# Patient Record
Sex: Female | Born: 1935 | Race: White | Hispanic: No | Marital: Married | Smoking: Former smoker
Health system: Southern US, Community
[De-identification: ages and names within clinical notes are randomized; demographics above are authoritative.]

## PROBLEM LIST (undated history)

## (undated) DIAGNOSIS — M8580 Other specified disorders of bone density and structure, unspecified site: Secondary | ICD-10-CM

## (undated) DIAGNOSIS — E611 Iron deficiency: Secondary | ICD-10-CM

## (undated) DIAGNOSIS — I2510 Atherosclerotic heart disease of native coronary artery without angina pectoris: Secondary | ICD-10-CM

## (undated) DIAGNOSIS — J383 Other diseases of vocal cords: Secondary | ICD-10-CM

## (undated) DIAGNOSIS — E538 Deficiency of other specified B group vitamins: Secondary | ICD-10-CM

## (undated) DIAGNOSIS — E7800 Pure hypercholesterolemia, unspecified: Secondary | ICD-10-CM

## (undated) DIAGNOSIS — K449 Diaphragmatic hernia without obstruction or gangrene: Secondary | ICD-10-CM

## (undated) DIAGNOSIS — I10 Essential (primary) hypertension: Secondary | ICD-10-CM

## (undated) DIAGNOSIS — Z8673 Personal history of transient ischemic attack (TIA), and cerebral infarction without residual deficits: Secondary | ICD-10-CM

## (undated) DIAGNOSIS — Z8679 Personal history of other diseases of the circulatory system: Secondary | ICD-10-CM

## (undated) HISTORY — DX: Deficiency of other specified B group vitamins: E53.8

## (undated) HISTORY — DX: Pure hypercholesterolemia, unspecified: E78.00

## (undated) HISTORY — DX: Atherosclerotic heart disease of native coronary artery without angina pectoris: I25.10

## (undated) HISTORY — PX: VAGINAL HYSTERECTOMY: SUR661

## (undated) HISTORY — DX: Iron deficiency: E61.1

## (undated) HISTORY — PX: OTHER SURGICAL HISTORY: SHX169

## (undated) HISTORY — DX: Diaphragmatic hernia without obstruction or gangrene: K44.9

## (undated) HISTORY — DX: Other specified disorders of bone density and structure, unspecified site: M85.80

## (undated) HISTORY — DX: Essential (primary) hypertension: I10

## (undated) HISTORY — PX: CORONARY ARTERY BYPASS GRAFT: SHX141

---

## 1997-01-04 ENCOUNTER — Encounter: Payer: Self-pay | Admitting: Cardiovascular Disease

## 2002-10-25 ENCOUNTER — Other Ambulatory Visit: Admission: RE | Admit: 2002-10-25 | Discharge: 2002-10-25 | Payer: Self-pay | Admitting: Obstetrics and Gynecology

## 2002-12-03 ENCOUNTER — Emergency Department (HOSPITAL_COMMUNITY): Admission: EM | Admit: 2002-12-03 | Discharge: 2002-12-03 | Payer: Self-pay | Admitting: Emergency Medicine

## 2003-03-15 ENCOUNTER — Encounter: Admission: RE | Admit: 2003-03-15 | Discharge: 2003-03-15 | Payer: Self-pay | Admitting: Neurosurgery

## 2003-11-26 ENCOUNTER — Other Ambulatory Visit: Admission: RE | Admit: 2003-11-26 | Discharge: 2003-11-26 | Payer: Self-pay | Admitting: Obstetrics and Gynecology

## 2004-05-05 ENCOUNTER — Encounter: Payer: Self-pay | Admitting: Cardiovascular Disease

## 2004-05-28 ENCOUNTER — Encounter: Payer: Self-pay | Admitting: Cardiovascular Disease

## 2004-06-18 ENCOUNTER — Encounter: Payer: Self-pay | Admitting: Cardiovascular Disease

## 2004-07-15 ENCOUNTER — Encounter (INDEPENDENT_AMBULATORY_CARE_PROVIDER_SITE_OTHER): Payer: Self-pay | Admitting: Specialist

## 2004-07-15 ENCOUNTER — Inpatient Hospital Stay (HOSPITAL_COMMUNITY): Admission: RE | Admit: 2004-07-15 | Discharge: 2004-07-17 | Payer: Self-pay | Admitting: Obstetrics and Gynecology

## 2007-03-20 ENCOUNTER — Ambulatory Visit (HOSPITAL_COMMUNITY): Admission: RE | Admit: 2007-03-20 | Discharge: 2007-03-20 | Payer: Self-pay | Admitting: Obstetrics and Gynecology

## 2007-03-21 ENCOUNTER — Ambulatory Visit (HOSPITAL_COMMUNITY): Admission: RE | Admit: 2007-03-21 | Discharge: 2007-03-21 | Payer: Self-pay | Admitting: Obstetrics and Gynecology

## 2007-03-28 ENCOUNTER — Emergency Department (HOSPITAL_COMMUNITY): Admission: EM | Admit: 2007-03-28 | Discharge: 2007-03-28 | Payer: Self-pay | Admitting: Emergency Medicine

## 2007-05-01 ENCOUNTER — Encounter: Admission: RE | Admit: 2007-05-01 | Discharge: 2007-05-01 | Payer: Self-pay | Admitting: Internal Medicine

## 2007-07-18 ENCOUNTER — Ambulatory Visit (HOSPITAL_COMMUNITY): Admission: RE | Admit: 2007-07-18 | Discharge: 2007-07-18 | Payer: Self-pay | Admitting: *Deleted

## 2007-12-29 ENCOUNTER — Encounter: Admission: RE | Admit: 2007-12-29 | Discharge: 2007-12-29 | Payer: Self-pay | Admitting: Internal Medicine

## 2008-01-05 ENCOUNTER — Ambulatory Visit (HOSPITAL_COMMUNITY): Admission: RE | Admit: 2008-01-05 | Discharge: 2008-01-05 | Payer: Self-pay | Admitting: *Deleted

## 2008-07-24 ENCOUNTER — Encounter: Admission: RE | Admit: 2008-07-24 | Discharge: 2008-07-24 | Payer: Self-pay | Admitting: Internal Medicine

## 2010-01-15 ENCOUNTER — Encounter: Payer: Self-pay | Admitting: Cardiovascular Disease

## 2010-02-12 ENCOUNTER — Encounter: Admission: RE | Admit: 2010-02-12 | Discharge: 2010-02-12 | Payer: Self-pay | Admitting: Internal Medicine

## 2010-08-12 ENCOUNTER — Encounter: Payer: Self-pay | Admitting: Cardiovascular Disease

## 2010-11-06 ENCOUNTER — Ambulatory Visit
Admission: RE | Admit: 2010-11-06 | Discharge: 2010-11-06 | Payer: Self-pay | Source: Home / Self Care | Attending: Cardiovascular Disease | Admitting: Cardiovascular Disease

## 2010-11-06 ENCOUNTER — Encounter: Payer: Self-pay | Admitting: Cardiovascular Disease

## 2010-11-06 DIAGNOSIS — I10 Essential (primary) hypertension: Secondary | ICD-10-CM | POA: Insufficient documentation

## 2010-11-12 NOTE — Assessment & Plan Note (Signed)
Summary: np6/dx:cad s/p cabg   Visit Type:  Initial Consult Primary Provider:  Dr Selena Batten  CC:  Cardiology consult.- CAD.  History of Present Illness: 77 year-old woman presents for initial evaluation of CAD. The patient is s/p 5 vessel CABG in 1998. She presented with bilateral arm amd mouth pain. This went on for several months and she finally underwent a stress test showing ischemia followed by cath showing multivessel disease. She then underwent CABG consisting of a LIMA-LAD, SVG - OM, and sequential SVG -acute marginal, PDA, and PLA.  The patient has no cardiovascular symptoms at this time.  She denies chest pain, dyspnea, orthopnea, PND, edema, palpitations, lightheadedness, or syncope. She is active and mows the lawn, does yardwork. She is not engaged in regular exercise. She is anxious about her first visit today and notes elevated BP, but reports her BP is usually in good range.   Current Medications (verified): 1)  Prednisone 10 Mg Tabs (Prednisone) .... Take 1 Tablet By Mouth Once A Day 2)  Vytorin 10-40 Mg Tabs (Ezetimibe-Simvastatin) .... Take One Tablet By Mouth Dailyat Bedtime 3)  Micardis 80 Mg Tabs (Telmisartan) .... Take One Tablet By Mouth Daily 4)  Bisoprolol-Hydrochlorothiazide 5-6.25 Mg Tabs (Bisoprolol-Hydrochlorothiazide) .... Take 1 Tablet By Mouth Once A Day 5)  Multivitamins  Tabs (Multiple Vitamin) .... Take 1 Tablet By Mouth Once A Day 6)  Vitamin C 500 Mg Tabs (Ascorbic Acid) .... Take 1 Tablet By Mouth Once A Day 7)  Aspirin 81 Mg Tbec (Aspirin) .... Take One Tablet By Mouth Daily 8)  Vitamin B-12 1000 Mcg Tabs (Cyanocobalamin) .... Take 1 Tablet By Mouth Once A Day 9)  Tandem 162-115.2 Mg Caps (Ferrous Fum-Iron Polysacch) .... Take 1 Capsule By Mouth Once A Day 10)  Botox Cosmetic 100 Unit Solr (Onabotulinumtoxina (Cosmetic)) .... Around Eyes Every 3 Months 11)  Botox Cosmetic 100 Unit Solr (Onabotulinumtoxina (Cosmetic)) .... Vocal Cords Every 3  Months  Allergies (verified): No Known Drug Allergies  Past History:  Past medical, surgical, family and social histories (including risk factors) reviewed, and no changes noted (except as noted below).  Past Medical History: Coronary artery disease s/p multivessel CABG 1998 Hypercholesterolemia Hypertension Osteopenia Hiatal hernia Vitamin B12 deficiency Iron deficiency  Past Surgical History:  1.  She has had coronary artery bypass grafting x 5.  2.  Left breast biopsy x 2.  3.  She has had eyelid surgery.  4. Partial hystercetomy  Family History: Reviewed history from 11/05/2010 and no changes required. Her paternal grandfather and father had strokes. Her mother had a heaart attack.  Social History: Reviewed history from 11/05/2010 and no changes required.  The patient is married and denies alcohol, tobacco, or drug  use.  Retired as Art therapist  Review of Systems       Positive for indigestion at times  Vital Signs:  Patient profile:   77 year old female Height:      59 inches Weight:      142.75 pounds BMI:     28.94 Pulse rate:   64 / minute Resp:     18 per minute BP sitting:   193 / 75  (right arm) Cuff size:   large  Vitals Entered By: Vikki Ports (November 06, 2010 11:05 AM)  Physical Exam  General:  Pt is well-developed, alert and oriented, no acute distress. overwieght, very pleasant woman. HEENT: normal Neck: no thyromegaly  JVP normal, carotid upstrokes normal without bruits Lungs: CTA Chest: equal expansion  CV: Apical impulse nondisplaced, RRR without murmur or gallop Abd: soft, obese, NT, positive BS, no HSM, no bruit Back: no CVA tenderness Ext: no clubbing, cyanosis, or edema        femoral pulses 2+ without bruits        pedal pulses 2+ and equal Skin: warm, dry, no rash Neuro: CNII-XII intact,strength 5/5 = b/l    EKG  Procedure date:  11/06/2010  Findings:      NSR 64 bpm, nonspecific  ST-T changes.  Echocardiogram  Procedure date:  05/28/2004  Findings:      Normal LV function with LVEF estimated greater than 55% Mild tricuspid regurgitation Trace MR  Impression & Recommendations:  Problem # 1:  CORONARY ATHEROSLERO AUTOL VEIN BYPASS GRAFT (ICD-414.02) The patient appears stable from a CV perspective now 14 removed from multivessel CABG. She has no symptoms at her current activity level. Operative report and echo reviewed from her paperwork (LVEF is preserved).  Would continue her current medical program as below. Will followup clinically in one year.   Her updated medication list for this problem includes:    Bisoprolol-hydrochlorothiazide 5-6.25 Mg Tabs (Bisoprolol-hydrochlorothiazide) .Marland Kitchen... Take 1 tablet by mouth once a day    Aspirin 81 Mg Tbec (Aspirin) .Marland Kitchen... Take one tablet by mouth daily  Orders: EKG w/ Interpretation (93000)  Problem # 2:  UNSPECIFIED ESSENTIAL HYPERTENSION (ICD-401.9) Elevated BP attributed to anxiety. Dr Elmyra Ricks notes reviewed and BP has been in range at his visits. She will monitor BP at home and call with results or bring readings to next visit with Dr Selena Batten.  Her updated medication list for this problem includes:    Micardis 80 Mg Tabs (Telmisartan) .Marland Kitchen... Take one tablet by mouth daily    Bisoprolol-hydrochlorothiazide 5-6.25 Mg Tabs (Bisoprolol-hydrochlorothiazide) .Marland Kitchen... Take 1 tablet by mouth once a day    Aspirin 81 Mg Tbec (Aspirin) .Marland Kitchen... Take one tablet by mouth daily  Orders: EKG w/ Interpretation (93000)  Patient Instructions: 1)  Your physician wants you to follow-up in: 1 YEAR.  You will receive a reminder letter in the mail two months in advance. If you don't receive a letter, please call our office to schedule the follow-up appointment. 2)  Your physician has requested that you regularly monitor and record your blood pressure readings at home.  Please use the same machine at the same time of day to check your readings and  record them to bring to your follow-up visit. Please call the office in 2 WEEKS with your blood pressure readings.

## 2010-11-26 ENCOUNTER — Telehealth: Payer: Self-pay | Admitting: Cardiovascular Disease

## 2010-12-08 NOTE — Letter (Signed)
Summary: [facility]  [facility]   Imported By: Marylou Mccoy 12/03/2010 16:25:18  _____________________________________________________________________  External Attachment:    Type:   Image     Comment:   External Document

## 2010-12-08 NOTE — Progress Notes (Signed)
Summary: blood pressure readings  Phone Note Call from Patient Call back at Home Phone [phone]   Caller: Patient Reason for Call: Talk to Nurse Summary of Call: pt blood pressure 126/66 135/68 130/60 last three days. pt states she was told to call in her blood pressure reading. pt has more reading for the nurse. Initial call taken by: Roe Coombs,  November 26, 2010 2:09 PM  Follow-up for Phone Call        11/07/10  131/69  12/09/10  143/68 11/10/10  164/74 11/12/10    168/45 11/12/10    136/62 11/14/10    142/70 11/16/10    136/64 11/17/10    139/63 11/18/10    157/73 11/21/10  126/66 2/13//12  135/68 11/24/10  130/60 pt says she took all around 12:30-1:00.  Aware will forward to Dr Excell Seltzer for review Dennis Bast, RN, BSN  November 26, 2010 2:38 PM  Additional Follow-up for Phone Call Additional follow up Details #1::        would continue current Rx. Additional Follow-up by: Norva Karvonen, MD,  December 01, 2010 5:56 PM    Additional Follow-up for Phone Call Additional follow up Details #2::    Pt aware of Dr Earmon Phoenix recommendation.  Julieta Gutting, RN, BSN  December 01, 2010 6:20 PM

## 2010-12-08 NOTE — Letter (Signed)
Summary: [facility]  [facility]   Imported By: Marylou Mccoy 12/03/2010 16:23:12  _____________________________________________________________________  External Attachment:    Type:   Image     Comment:   External Document

## 2010-12-08 NOTE — Op Note (Signed)
Summary: [facility]  [facility]   Imported By: Marylou Mccoy 12/03/2010 16:30:57  _____________________________________________________________________  External Attachment:    Type:   Image     Comment:   External Document

## 2010-12-17 NOTE — Letter (Signed)
Summary: [facility]  [facility]   Imported By: Marylou Mccoy 12/07/2010 08:33:11  _____________________________________________________________________  External Attachment:    Type:   Image     Comment:   External Document

## 2010-12-17 NOTE — Letter (Signed)
Summary: [facility]  [facility]   Imported By: Marylou Mccoy 12/07/2010 08:34:43  _____________________________________________________________________  External Attachment:    Type:   Image     Comment:   External Document

## 2011-02-12 ENCOUNTER — Encounter: Payer: Self-pay | Admitting: Cardiovascular Disease

## 2011-02-23 NOTE — Op Note (Signed)
[name] [name], WEYANDT              ACCOUNT NO.:  [id number]   MEDICAL RECORD NO.:  [id number]          PATIENT TYPE:  AMB   LOCATION:  ENDO                         FACILITY:  [facility]   PHYSICIAN:  Georgiana Spinner, M.D.    DATE OF BIRTH:  11/30/1931   DATE OF PROCEDURE:  07/18/2007  DATE OF DISCHARGE:                               OPERATIVE REPORT   PROCEDURE:  Upper endoscopy.   INDICATIONS:  Hemoccult positivity.   ANESTHESIA:  Fentanyl 50 mcg, Versed 4 mg.   PROCEDURE:  With the patient mildly sedated in the left lateral  decubitus position, the Pentax videoscopic endoscope was inserted in the  mouth and passed under direct vision through the esophagus which  appeared normal.  There was no evidence of Barrett's esophagus.  We  entered into the stomach through a hiatal hernia.  Fundus, body, antrum,  duodenal bulb, and second portion duodenum all appeared normal.  From  this point the endoscope was slowly withdrawn taking circumferential  views of duodenal mucosa until the endoscope been pulled back in the  stomach placed in retroflexion to view the stomach from below.  The  endoscope was straightened and withdrawn taking circumferential views  remaining gastric and esophageal mucosa.  The patient's vital signs,  pulse oximeter remained stable.  The patient tolerated procedure well  without apparent complications.   FINDINGS:  Hiatal hernia with a loose wrap of the gastroesophageal  junction around the endoscope indicating laxity of the lower esophageal  sphincter.   PLAN:  Proceed to colonoscopy.           ______________________________  Georgiana Spinner, M.D.     GMO/MEDQ  D:  07/18/2007  T:  07/18/2007  Job:  [id number]

## 2011-02-23 NOTE — Op Note (Signed)
[name] [name], [name] [name]              ACCOUNT NO.:  [id number]   MEDICAL RECORD NO.:  [id number]          PATIENT TYPE:  AMB   LOCATION:  ENDO                         FACILITY:  [facility]   PHYSICIAN:  Georgiana Spinner, M.D.    DATE OF BIRTH:  [birth year]   DATE OF PROCEDURE:  07/18/2007  DATE OF DISCHARGE:                               OPERATIVE REPORT   ANESTHESIA:  Fentanyl 50 mcg, Versed 4 mg.   INDICATIONS:  Hemoccult positivity.   PROCEDURE:  With the patient mildly sedated in the left lateral  decubitus position, the Pentax videoscopic colonoscope was inserted into  the rectum and passed under direct vision to the cecum identified by  ileocecal valve and appendiceal orifice both of which were photographed.  From this point the colonoscope was slowly withdrawn taking  circumferential views of the colonic mucosa, stopping only in the rectum  which appeared normal on direct, and I could not retroflex, the rectum  was too small, so I just pulled through the anal canal and photographed  this area.   The endoscope was withdrawn.  The patient's vital signs, and pulse  oximeter remained stable.  The patient tolerated procedure well without  apparent complications.   FINDINGS:  Internal hemorrhoids otherwise an unremarkable exam.   PLAN:  To have patient follow up with me as an outpatient.           ______________________________  Georgiana Spinner, M.D.     GMO/MEDQ  D:  07/18/2007  T:  07/18/2007  Job:  [id number]

## 2011-02-26 NOTE — H&P (Signed)
[name] [name], [name] [name]              ACCOUNT NO.:  [id number]   MEDICAL RECORD NO.:  [id number]          PATIENT TYPE:  INP   LOCATION:  NA                           FACILITY:  [facility]   PHYSICIAN:  Zenaida Niece, M.D.DATE OF BIRTH:  [birth year]   DATE OF ADMISSION:  07/15/2004  DATE OF DISCHARGE:                                HISTORY & PHYSICAL   CHIEF COMPLAINT:  Pelvic relaxation.   HISTORY AND PHYSICAL:  This is a 77 year old white female, para 1-0-0-1,  whom I have followed for several years with pelvic relaxation.  Until  recently, she has been treated successfully the past few years with a  pessary.  However, when I saw her for her annual exam in February, she was  complaining that despite the pessary, the bladder was occasionally falling  out.  We decided to continue with the pessary.  I saw her in May of this  year, and she was complaining of increasing pelvic pressure and the bladder  falling out despite the pessary, and she was even having to reduce her  bladder to urinate.  A larger pessary was placed at that time.  However, she  had discomfort with this and wishes to proceed with surgery.   PAST OBSTETRICAL HISTORY:  Vaginal delivery at term without complications.   PAST MEDICAL HISTORY:  Significant for coronary artery disease,  hypercholesterolemia, hypertension, and osteopenia.   PAST SURGICAL HISTORY:  1.  She has had coronary artery bypass grafting x 5.  2.  Left breast biopsy x 2.  3.  She has had eyelid surgery.   ALLERGIES:  None known.   CURRENT MEDICATIONS:  Detrol, folic acid, Vytorin, Teveten, aspirin, and  Fosamax.   REVIEW OF SYSTEMS:  The patient does have overactive bladder which is well  controlled with Detrol, and she has normal bowel function.   SOCIAL HISTORY:  The patient is married and denies alcohol, tobacco, or drug  use.   FAMILY HISTORY:  No GYN or colon cancer.   PHYSICAL EXAMINATION:  VITAL SIGNS:  Weight is approximately 145  pounds,  last blood pressure in the office was 130/100.  Pulse was 80.  GENERALLY:  This is a well-developed, well-nourished white female in no  acute distress.  NECK:  Supple without lymphadenopathy or thyromegaly.  LUNGS:  Clear to auscultation.  HEART:  Regular rate and rhythm without murmur, and she does have a well-  healed midline sternotomy scar.  ABDOMEN:  Soft, nontender, nondistended, without palpable masses.  EXTREMITIES:  No edema and are nontender.  PELVIC:  External genitalia reveals no lesions.  Cervix is normal, although  it does come to the introitus.  She has a grade 3 cystocele with small  rectocele and grade 2 uterine descensus.  On bimanual exam, she has a small  mid plane nontender uterus which is very mobile.  Bimanual exam confirms  this without the present of adnexal masses.   ASSESSMENT:  Symptomatic pelvic relaxation which is now failing therapy with  a pessary.  The patient wishes to undergo surgical therapy.  Risks of  surgery including bleeding,  infection, and damage to surrounding organs have  been discussed with the patient.  She does have significant medical  problems, and I do have a note from her primary care physician, Dr. Dimas Alexandria that says that she is medically stable for her surgery.   PLAN:  Admit to the patient for a vaginal hysterectomy with anterior and  posterior repair and cystoscopy.     Todd   TDM/MEDQ  D:  07/14/2004  T:  07/14/2004  Job:  [id number]

## 2011-02-26 NOTE — Op Note (Signed)
NAMEREAH, JUSTO              ACCOUNT NO.:  [id number]   MEDICAL RECORD NO.:  [id number]          PATIENT TYPE:  INP   LOCATION:  0008                         [facility]ACILITY:  [facility]   PHYSICIAN:  Zenaida Niece, M.D.DATE OF BIRTH:  03/22/1932   DATE OF PROCEDURE:  07/15/2004  DATE OF DISCHARGE:                                 OPERATIVE REPORT   PREOPERATIVE DIAGNOSIS:  Pelvic relaxation.   POSTOPERATIVE DIAGNOSIS:  Pelvic relaxation.   PROCEDURE:  Transvaginal hysterectomy.  Anterior and posterior colporrhaphy and cystoscopy.   SURGEON:  Lavina Hamman, M.D.   ASSISTANT:  Tracey Harries, M.D.   ANESTHESIA:  Spinal.   ESTIMATED BLOOD LOSS:  100 mL.   FINDINGS:  Small uterus, grade 2-3 cystocele, and grade 1 rectocele, normal  tubes and ovaries.  Normal bladder and ureters by cystoscopy.   PROCEDURE IN DETAIL:  The patient was taken to the operating room and placed  in the dorsal supine position.  She was then placed sitting, and Dr.  Shireen Quan instilled spinal anesthesia.  She was then placed in the dorsal  supine position and legs placed in mobile stirrups.  Legs were then elevated  in the stirrups, and perineum and vagina were prepped and draped in the  usual sterile fashion and bladder drained with a red rubber catheter.  Level  of her anesthesia was found to be adequate.  A weighted speculum was  inserted into the vagina, and the cervix was grasped with Jacob's  tenaculums.  Cervicovaginal mucosa was then infiltrated with a dilute  solution of Pitressin.  Cervicovaginal mucosa was then incised  circumferentially with electrocautery.  Scissors were then used for further  sharp dissection to push the vaginal mucosa off of the cervix.  Posterior  cul-de-sac was grasped and entered sharply and a Bonnano speculum placed  into the posterior cul-de-sac.  Bladder was pushed superior anteriorly.  Uterosacral ligaments were clamped, transected, and ligated with a #1  chromic and  tagged for later use.  Anterior peritoneum was identified and  entered sharply.  A Deaver was used to retract the bladder anteriorly.  Uterine arteries, cardinal ligaments, broad ligaments, and uteroovarian  ligaments were clamped, transected, and ligated with #1 chromic, and the  uteroovarian pedicles were tagged for inspection.  All sites were found to  be hemostatic.  The uterus was very small.  Tubes and ovaries were inspected  and found to be normal.  Uterosacral ligaments were then plicated in the  midline with 2-0 silk.  The previously tagged uterosacral pedicles were also  tied in the midline.   Attention was then turned to the anterior colporrhaphy.  Anterior vagina was  grasped at the vaginal apex with Allis clamps.  It was then dissected in the  midline from the vaginal apex to within 1 cm of the urethral meatus.  The  cystocele was then dissected laterally, sharply, and bluntly to mobilize the  cystocele.  Bleeding controlled with electrocautery.  Cystocele was then  reduced with interrupted sutures of 2-0 Vicryl.  Excess vaginal mucosa was  then removed.  The vagina was then closed in  a running locking fashion with  two sutures of 2-0 Vicryl.  This closed the entire vagina.  This achieved  good closure and good hemostasis and fair reduction of the cystocele.   Attention was turned to posterior colporrhaphy.  Hymenal ring was grasped at  a distance posteriorly that would still allow two fingers to easily enter  the vagina.  A piece of this ring of the mucosa was then removed sharply.  The vagina was dissected in the midline from the hymenal ring to the vaginal  apex sharply.  The small rectocele was then mobilized laterally, sharply,  and bluntly.  Rectocele was then reduced with interrupted sutures of 2-0  Vicryl.  Excess vaginal mucosa was then removed and vagina closed in a  running locking fashion with 2-0 Vicryl.  A finger was placed in the rectum  and confirmed no  sutures in the rectum and no rectal injury.   Attention was turned to cystoscopy.  The patient was given indigo carmine  IV.  The 70-degree cystoscope was inserted and the bladder instilled with  approximately 100 mL of fluid.  The bladder appeared normal with no sutures  and no injury to the bladder.  Indigo carmine was seen to come from each  ureteral orifice.  The cystoscope was removed, and a Foley catheter was  placed.  The vagina was then packed with two-inch Iodoform gauze.  The  patient was then taken down from stirrups after all instruments were removed  from the vagina.  The patient tolerated the procedure well and was taken to  the recovery room in stable condition.  Counts were correct x 2.  She  received Ancef 1 g prior to the procedure.  She had PAS hose on throughout  the procedure.     Todd   TDM/MEDQ  D:  07/15/2004  T:  07/15/2004  Job:  [id number]

## 2011-02-26 NOTE — Discharge Summary (Signed)
[name] [name], [name]              ACCOUNT NO.:  [id number]   MEDICAL RECORD NO.:  [id number]          PATIENT TYPE:  INP   LOCATION:  0443                         [facility]ACILITY:  [facility]   PHYSICIAN:  Zenaida Niece, M.D.DATE OF BIRTH:  [birth year]   DATE OF ADMISSION:  07/15/2004  DATE OF DISCHARGE:  07/17/2004                                 DISCHARGE SUMMARY   ADMISSION DIAGNOSIS:  Pelvic relaxation.   DISCHARGE DIAGNOSIS:  Pelvic relaxation.   PROCEDURES:  Vaginal hysterectomy with anterior and posterior colporrhaphy  and cystoscopy.   HISTORY AND PHYSICAL:  Please see chart for full history and physical, but  briefly this is a 77 year old white female, para 1-0-0-1, whom I have  followed for several years for pelvic relaxation.  She has been treated with  pessary but has recently been intolerant of her pessary and is admitted for  surgical therapy.   PAST MEDICAL HISTORY:  1.  Coronary artery disease.  2.  Hypercholesterolemia.  3.  Hypertension.  4.  Osteopenia.   PAST SURGICAL HISTORY:  1.  Coronary artery bypass grafting x 5.  2.  Left breast biopsy and eyelid surgery.   PHYSICAL EXAMINATION:  Significant for a benign abdomen.  On pelvic exam,  cervix was normal, although it does come to the introitus.  She has a grade  3 cystocele and a small rectocele with grade 2 uterine descensus.  She has a  small, mid plane, nontender uterus, and no adnexal masses.   HOSPITAL COURSE:  The patient was admitted on the day of surgery and  underwent the above-mentioned procedure with spinal anesthesia.  Estimated  blood loss was 100 mL.  She was found to have a very small uterus with a  grade 2-3 cystocele and a grade 1 rectocele.  Postoperatively, she had no  significant complications except some nausea.  Preoperative hemoglobin 12.3,  postoperative 10.8.  She did have slightly decreased urine output which  responded to fluid bolus.  On the morning of postoperative day #2, she was  felt to be stable enough for discharge home.   DISCHARGE INSTRUCTIONS:  1.  Regular diet.  2.  Pelvic rest.  3.  No strenuous activity.  4.  Follow up is in approximately 2 weeks.   MEDICATIONS:  1.  Percocet #40, 1-2 p.o. q.4-6h. p.r.n. pain.  2.  She is to continue all of her preadmission medications.     Todd   TDM/MEDQ  D:  08/19/2004  T:  08/19/2004  Job:  [id number]

## 2011-07-28 LAB — URINALYSIS, ROUTINE W REFLEX MICROSCOPIC: Protein, ur: NEGATIVE

## 2011-07-28 LAB — I-STAT 8, (EC8 V) (CONVERTED LAB)
Bicarbonate: 27.8 — ABNORMAL HIGH
Chloride: 107
Glucose, Bld: 99
Operator id: 272551
TCO2: 29

## 2011-07-28 LAB — POCT I-STAT CREATININE: Creatinine, Ser: 0.8

## 2011-07-28 LAB — CK: Total CK: 49

## 2011-10-18 DIAGNOSIS — G245 Blepharospasm: Secondary | ICD-10-CM | POA: Diagnosis not present

## 2011-11-05 ENCOUNTER — Encounter: Payer: Self-pay | Admitting: *Deleted

## 2011-11-09 ENCOUNTER — Ambulatory Visit (INDEPENDENT_AMBULATORY_CARE_PROVIDER_SITE_OTHER): Payer: Medicare Other | Admitting: Cardiovascular Disease

## 2011-11-09 ENCOUNTER — Encounter: Payer: Self-pay | Admitting: Cardiovascular Disease

## 2011-11-09 DIAGNOSIS — I10 Essential (primary) hypertension: Secondary | ICD-10-CM

## 2011-11-09 DIAGNOSIS — I25810 Atherosclerosis of coronary artery bypass graft(s) without angina pectoris: Secondary | ICD-10-CM | POA: Diagnosis not present

## 2011-11-09 NOTE — Patient Instructions (Signed)
Your physician wants you to follow-up in: 1 YEAR.  You will receive a reminder letter in the mail two months in advance. If you don't receive a letter, please call our office to schedule the follow-up appointment.  Your physician recommends that you continue on your current medications as directed. Please refer to the Current Medication list given to you today.  

## 2011-11-16 DIAGNOSIS — I10 Essential (primary) hypertension: Secondary | ICD-10-CM | POA: Diagnosis not present

## 2011-11-16 DIAGNOSIS — I2510 Atherosclerotic heart disease of native coronary artery without angina pectoris: Secondary | ICD-10-CM | POA: Diagnosis not present

## 2011-11-16 DIAGNOSIS — M353 Polymyalgia rheumatica: Secondary | ICD-10-CM | POA: Diagnosis not present

## 2011-11-21 ENCOUNTER — Encounter: Payer: Self-pay | Admitting: Cardiovascular Disease

## 2011-11-21 NOTE — Progress Notes (Signed)
HPI:  This is a 78 year old woman presented for followup evaluation. The patient is 15 years out from five-vessel coronary bypass surgery. She is doing well and has no complaints at this time. She specifically denies chest pain, dyspnea, edema, palpitations, lightheadedness, or syncope. She reports good compliance with her medical program. She follows regularly with Dr. Selena Batten.  Outpatient Encounter Prescriptions as of 11/09/2011  Medication Sig Dispense Refill  . aspirin 81 MG tablet Take 81 mg by mouth daily.      . bisoprolol-hydrochlorothiazide (ZIAC) 5-6.25 MG per tablet Take 1 tablet by mouth daily.      Marland Kitchen ezetimibe-simvastatin (VYTORIN) 10-40 MG per tablet Take 1 tablet by mouth at bedtime.      . Multiple Vitamin (MULTIVITAMIN) tablet Take 1 tablet by mouth daily.      Marland Kitchen omeprazole (PRILOSEC) 20 MG capsule Take 20 mg by mouth as needed.       . predniSONE (DELTASONE) 5 MG tablet Take 5 mg by mouth daily.      Marland Kitchen telmisartan (MICARDIS) 80 MG tablet Take 80 mg by mouth daily.      . vitamin C (ASCORBIC ACID) 500 MG tablet Take 500 mg by mouth daily.      Marland Kitchen DISCONTD: predniSONE (DELTASONE) 10 MG tablet Take 5 mg by mouth daily.       Marland Kitchen DISCONTD: ferrous fumarate-iron polysaccharide complex (TANDEM) 162-115.2 MG CAPS Take 1 capsule by mouth daily with breakfast.      . DISCONTD: vitamin B-12 (CYANOCOBALAMIN) 1000 MCG tablet Take 1,000 mcg by mouth daily.        No Known Allergies  Past Medical History  Diagnosis Date  . CAD (coronary artery disease)     multivessel  . Hypercholesterolemia   . Hypertension   . Osteopenia   . Hiatal hernia   . Vitamin B12 deficiency   . Iron deficiency     ROS: Negative except as per HPI  BP 110/64  Pulse 62  Ht 4' 11.5" (1.511 m)  Wt 65.318 kg (144 lb)  BMI 28.60 kg/m2  PHYSICAL EXAM: Pt is alert and oriented, NAD HEENT: normal Neck: JVP - normal, carotids 2+= without bruits Lungs: CTA bilaterally CV: RRR without murmur or gallop Abd: soft,  NT, Positive BS, no hepatomegaly Ext: no C/C/E, distal pulses intact and equal Skin: warm/dry no rash  ASSESSMENT AND PLAN:

## 2011-11-21 NOTE — Assessment & Plan Note (Signed)
The patient is stable without angina. Her medications were reviewed and she is on good therapy with aspirin, beta blocker, a statin drug, and an ARB. She will continue her current program and follow up in one year.

## 2011-11-21 NOTE — Assessment & Plan Note (Signed)
Blood pressure is well-controlled. Lipids are followed by Dr. Selena Batten. The patient will call if she develops any symptoms and otherwise will follow up in one year.

## 2012-01-24 DIAGNOSIS — G245 Blepharospasm: Secondary | ICD-10-CM | POA: Diagnosis not present

## 2012-02-04 DIAGNOSIS — J385 Laryngeal spasm: Secondary | ICD-10-CM | POA: Diagnosis not present

## 2012-02-08 DIAGNOSIS — R920 Mammographic microcalcification found on diagnostic imaging of breast: Secondary | ICD-10-CM | POA: Diagnosis not present

## 2012-02-15 ENCOUNTER — Other Ambulatory Visit: Payer: Self-pay | Admitting: Radiology

## 2012-02-15 DIAGNOSIS — Z0189 Encounter for other specified special examinations: Secondary | ICD-10-CM | POA: Diagnosis not present

## 2012-02-15 DIAGNOSIS — N6019 Diffuse cystic mastopathy of unspecified breast: Secondary | ICD-10-CM | POA: Diagnosis not present

## 2012-02-15 DIAGNOSIS — R920 Mammographic microcalcification found on diagnostic imaging of breast: Secondary | ICD-10-CM | POA: Diagnosis not present

## 2012-03-17 DIAGNOSIS — M25519 Pain in unspecified shoulder: Secondary | ICD-10-CM | POA: Diagnosis not present

## 2012-03-17 DIAGNOSIS — M719 Bursopathy, unspecified: Secondary | ICD-10-CM | POA: Diagnosis not present

## 2012-03-17 DIAGNOSIS — M353 Polymyalgia rheumatica: Secondary | ICD-10-CM | POA: Diagnosis not present

## 2012-03-17 DIAGNOSIS — M67919 Unspecified disorder of synovium and tendon, unspecified shoulder: Secondary | ICD-10-CM | POA: Diagnosis not present

## 2012-05-01 DIAGNOSIS — G245 Blepharospasm: Secondary | ICD-10-CM | POA: Diagnosis not present

## 2012-05-10 DIAGNOSIS — M353 Polymyalgia rheumatica: Secondary | ICD-10-CM | POA: Diagnosis not present

## 2012-05-10 DIAGNOSIS — I10 Essential (primary) hypertension: Secondary | ICD-10-CM | POA: Diagnosis not present

## 2012-05-15 DIAGNOSIS — M353 Polymyalgia rheumatica: Secondary | ICD-10-CM | POA: Diagnosis not present

## 2012-05-15 DIAGNOSIS — E7800 Pure hypercholesterolemia, unspecified: Secondary | ICD-10-CM | POA: Diagnosis not present

## 2012-05-15 DIAGNOSIS — I10 Essential (primary) hypertension: Secondary | ICD-10-CM | POA: Diagnosis not present

## 2012-05-15 DIAGNOSIS — K219 Gastro-esophageal reflux disease without esophagitis: Secondary | ICD-10-CM | POA: Diagnosis not present

## 2012-06-23 DIAGNOSIS — J387 Other diseases of larynx: Secondary | ICD-10-CM | POA: Diagnosis not present

## 2012-06-23 DIAGNOSIS — J385 Laryngeal spasm: Secondary | ICD-10-CM | POA: Diagnosis not present

## 2012-08-07 DIAGNOSIS — G245 Blepharospasm: Secondary | ICD-10-CM | POA: Diagnosis not present

## 2012-08-21 DIAGNOSIS — M353 Polymyalgia rheumatica: Secondary | ICD-10-CM | POA: Diagnosis not present

## 2012-08-21 DIAGNOSIS — Z23 Encounter for immunization: Secondary | ICD-10-CM | POA: Diagnosis not present

## 2012-09-27 DIAGNOSIS — N390 Urinary tract infection, site not specified: Secondary | ICD-10-CM | POA: Diagnosis not present

## 2012-09-27 DIAGNOSIS — J329 Chronic sinusitis, unspecified: Secondary | ICD-10-CM | POA: Diagnosis not present

## 2012-10-27 DIAGNOSIS — J385 Laryngeal spasm: Secondary | ICD-10-CM | POA: Diagnosis not present

## 2012-11-09 ENCOUNTER — Ambulatory Visit: Payer: Medicare Other | Admitting: Nurse Practitioner

## 2012-11-13 DIAGNOSIS — G245 Blepharospasm: Secondary | ICD-10-CM | POA: Diagnosis not present

## 2012-11-14 ENCOUNTER — Encounter: Payer: Self-pay | Admitting: Physician Assistant

## 2012-11-14 ENCOUNTER — Ambulatory Visit (INDEPENDENT_AMBULATORY_CARE_PROVIDER_SITE_OTHER): Payer: Medicare Other | Admitting: Physician Assistant

## 2012-11-14 VITALS — BP 144/70 | HR 61 | Ht 59.5 in | Wt 144.1 lb

## 2012-11-14 DIAGNOSIS — I2510 Atherosclerotic heart disease of native coronary artery without angina pectoris: Secondary | ICD-10-CM | POA: Insufficient documentation

## 2012-11-14 DIAGNOSIS — E785 Hyperlipidemia, unspecified: Secondary | ICD-10-CM | POA: Diagnosis not present

## 2012-11-14 NOTE — Progress Notes (Signed)
  [address], [address] [place], [place]  [address] Phone: [phone], Fax:  [phone]  Date:  11/14/2012   ID:  [name] [name], DOB 04/12/1932, MRN [id number]  PCP:  Pearson Grippe, MD  Primary Cardiologist:  Dr. Tonny Bollman     History of Present Illness: [name] [name] is a 78 y.o. female who returns for annual follow up. She has a history of CAD, status post CABG in 1998 (LIMA-LAD, SVG-OM, SVG-AM/PDA/PLA), HTN, HL. Last seen by Dr. Excell Seltzer 2/13.  She is doing well.  She remains very active.  she continues to do all of her yard work which includes push mowing her yard and raking.  The patient denies chest pain, shortness of breath, syncope, orthopnea, PND or significant pedal edema.   Wt Readings from Last 3 Encounters:  11/14/12 144 lb 1.9 oz (65.372 kg)  11/09/11 144 lb (65.318 kg)  11/06/10 142 lb 12 oz (64.751 kg)     Past Medical History  Diagnosis Date  . CAD (coronary artery disease)     multivessel  . Hypercholesterolemia   . Hypertension   . Osteopenia   . Hiatal hernia   . Vitamin B12 deficiency   . Iron deficiency     Current Outpatient Prescriptions  Medication Sig Dispense Refill  . aspirin 81 MG tablet Take 81 mg by mouth daily.      Marland Kitchen BENICAR 40 MG tablet       . bisoprolol-hydrochlorothiazide (ZIAC) 5-6.25 MG per tablet Take 1 tablet by mouth daily.      Marland Kitchen ezetimibe-simvastatin (VYTORIN) 10-40 MG per tablet Take 1 tablet by mouth at bedtime.      . Multiple Vitamin (MULTIVITAMIN) tablet Take 1 tablet by mouth daily.      . OnabotulinumtoxinA (BOTOX IJ) Inject as directed every 3 (three) months.      . predniSONE (DELTASONE) 5 MG tablet Take 5 mg by mouth daily.      . vitamin C (ASCORBIC ACID) 500 MG tablet Take 500 mg by mouth daily.        Allergies:   No Known Allergies  Social History:  The patient  reports that she has quit smoking. She does not have any smokeless tobacco history on file. She reports that she does not drink alcohol  or use illicit drugs.   ROS:  Please see the history of present illness.    All other systems reviewed and negative.   PHYSICAL EXAM: VS:  BP 144/70  Pulse 61  Ht 4' 11.5" (1.511 m)  Wt 144 lb 1.9 oz (65.372 kg)  BMI 28.62 kg/m2 Well nourished, well developed, in no acute distress HEENT: normal Neck: no JVD Cardiac:  normal S1, S2; RRR; no murmur Chest:  Sternal wire more prominent near manubrium  Lungs:  clear to auscultation bilaterally, no wheezing, rhonchi or rales Abd: soft, nontender, no hepatomegaly Ext: no edema Skin: warm and dry Neuro:  CNs 2-12 intact, no focal abnormalities noted  EKG:  NSR, HR 60, normal axis, nonspecific ST-T wave changes, no change from prior tracing     ASSESSMENT AND PLAN:  1. Coronary Artery Disease:  Overall stable. She denies angina. She remains quite active without limitation. Continue aspirin and statin. 2. Hypertension: Controlled.  Continue current therapy. 3. Hyperlipidemia:  Managed by PCP.  4. Disposition:  Followup with Dr. Excell Seltzer in one year or sooner as needed appear  Signed, Tereso Newcomer, PA-C  2:13 PM 11/14/2012

## 2012-11-14 NOTE — Patient Instructions (Addendum)
Your physician wants you to follow-up in: 12 MONTHS WITH DR. Excell Seltzer. You will receive a reminder letter in the mail two months in advance. If you don't receive a letter, please call our office to schedule the follow-up appointment.  NO CHANGES WERE MADE TODAY

## 2012-11-15 DIAGNOSIS — I10 Essential (primary) hypertension: Secondary | ICD-10-CM | POA: Diagnosis not present

## 2012-11-15 DIAGNOSIS — M810 Age-related osteoporosis without current pathological fracture: Secondary | ICD-10-CM | POA: Diagnosis not present

## 2012-11-30 DIAGNOSIS — E7800 Pure hypercholesterolemia, unspecified: Secondary | ICD-10-CM | POA: Diagnosis not present

## 2012-11-30 DIAGNOSIS — I10 Essential (primary) hypertension: Secondary | ICD-10-CM | POA: Diagnosis not present

## 2013-02-23 DIAGNOSIS — G245 Blepharospasm: Secondary | ICD-10-CM | POA: Diagnosis not present

## 2013-03-20 DIAGNOSIS — Z09 Encounter for follow-up examination after completed treatment for conditions other than malignant neoplasm: Secondary | ICD-10-CM | POA: Diagnosis not present

## 2013-03-20 DIAGNOSIS — N6019 Diffuse cystic mastopathy of unspecified breast: Secondary | ICD-10-CM | POA: Diagnosis not present

## 2013-04-06 DIAGNOSIS — J385 Laryngeal spasm: Secondary | ICD-10-CM | POA: Diagnosis not present

## 2013-05-30 DIAGNOSIS — E7800 Pure hypercholesterolemia, unspecified: Secondary | ICD-10-CM | POA: Diagnosis not present

## 2013-05-30 DIAGNOSIS — I10 Essential (primary) hypertension: Secondary | ICD-10-CM | POA: Diagnosis not present

## 2013-05-30 DIAGNOSIS — N390 Urinary tract infection, site not specified: Secondary | ICD-10-CM | POA: Diagnosis not present

## 2013-06-04 DIAGNOSIS — G245 Blepharospasm: Secondary | ICD-10-CM | POA: Diagnosis not present

## 2013-06-06 DIAGNOSIS — I10 Essential (primary) hypertension: Secondary | ICD-10-CM | POA: Diagnosis not present

## 2013-06-06 DIAGNOSIS — M353 Polymyalgia rheumatica: Secondary | ICD-10-CM | POA: Diagnosis not present

## 2013-06-06 DIAGNOSIS — N390 Urinary tract infection, site not specified: Secondary | ICD-10-CM | POA: Diagnosis not present

## 2013-06-06 DIAGNOSIS — R634 Abnormal weight loss: Secondary | ICD-10-CM | POA: Diagnosis not present

## 2013-06-06 DIAGNOSIS — E7800 Pure hypercholesterolemia, unspecified: Secondary | ICD-10-CM | POA: Diagnosis not present

## 2013-07-02 ENCOUNTER — Emergency Department (HOSPITAL_COMMUNITY)
Admission: EM | Admit: 2013-07-02 | Discharge: 2013-07-03 | Disposition: A | Discharge status: Home / Self Care | Payer: Medicare Other | Attending: Emergency Medicine | Admitting: Emergency Medicine

## 2013-07-02 DIAGNOSIS — Z862 Personal history of diseases of the blood and blood-forming organs and certain disorders involving the immune mechanism: Secondary | ICD-10-CM | POA: Diagnosis not present

## 2013-07-02 DIAGNOSIS — Z8719 Personal history of other diseases of the digestive system: Secondary | ICD-10-CM | POA: Diagnosis not present

## 2013-07-02 DIAGNOSIS — Y929 Unspecified place or not applicable: Secondary | ICD-10-CM | POA: Insufficient documentation

## 2013-07-02 DIAGNOSIS — IMO0002 Reserved for concepts with insufficient information to code with codable children: Secondary | ICD-10-CM | POA: Insufficient documentation

## 2013-07-02 DIAGNOSIS — S43006A Unspecified dislocation of unspecified shoulder joint, initial encounter: Secondary | ICD-10-CM | POA: Diagnosis not present

## 2013-07-02 DIAGNOSIS — I10 Essential (primary) hypertension: Secondary | ICD-10-CM | POA: Insufficient documentation

## 2013-07-02 DIAGNOSIS — Z951 Presence of aortocoronary bypass graft: Secondary | ICD-10-CM | POA: Diagnosis not present

## 2013-07-02 DIAGNOSIS — Z87891 Personal history of nicotine dependence: Secondary | ICD-10-CM | POA: Diagnosis not present

## 2013-07-02 DIAGNOSIS — Z7982 Long term (current) use of aspirin: Secondary | ICD-10-CM | POA: Insufficient documentation

## 2013-07-02 DIAGNOSIS — Z8639 Personal history of other endocrine, nutritional and metabolic disease: Secondary | ICD-10-CM | POA: Insufficient documentation

## 2013-07-02 DIAGNOSIS — I2510 Atherosclerotic heart disease of native coronary artery without angina pectoris: Secondary | ICD-10-CM | POA: Diagnosis not present

## 2013-07-02 DIAGNOSIS — M899 Disorder of bone, unspecified: Secondary | ICD-10-CM | POA: Diagnosis not present

## 2013-07-02 DIAGNOSIS — S43005A Unspecified dislocation of left shoulder joint, initial encounter: Secondary | ICD-10-CM

## 2013-07-02 DIAGNOSIS — Y9389 Activity, other specified: Secondary | ICD-10-CM | POA: Insufficient documentation

## 2013-07-02 DIAGNOSIS — X500XXA Overexertion from strenuous movement or load, initial encounter: Secondary | ICD-10-CM | POA: Insufficient documentation

## 2013-07-02 DIAGNOSIS — S43016A Anterior dislocation of unspecified humerus, initial encounter: Secondary | ICD-10-CM | POA: Diagnosis not present

## 2013-07-03 ENCOUNTER — Emergency Department (HOSPITAL_COMMUNITY): Payer: Medicare Other

## 2013-07-03 ENCOUNTER — Encounter (HOSPITAL_COMMUNITY): Payer: Self-pay | Admitting: Emergency Medicine

## 2013-07-03 DIAGNOSIS — S43016A Anterior dislocation of unspecified humerus, initial encounter: Secondary | ICD-10-CM | POA: Diagnosis not present

## 2013-07-03 MED ORDER — HYDROCODONE-ACETAMINOPHEN 5-325 MG PO TABS: 1.0000 | ORAL_TABLET | Freq: Four times a day (QID) | ORAL | Status: DC | PRN

## 2013-07-03 MED ORDER — OXYCODONE-ACETAMINOPHEN 5-325 MG PO TABS
1.0000 | ORAL_TABLET | Freq: Once | ORAL | Status: AC
  Administered 2013-07-03: 1 via ORAL
  Filled 2013-07-03: qty 1

## 2013-07-03 MED ORDER — ETOMIDATE 2 MG/ML IV SOLN: 0.1500 mg/kg | Freq: Once | INTRAVENOUS | Status: DC

## 2013-07-03 NOTE — ED Notes (Signed)
Pt states she tripped 1 wk ago and her shoulder has been 'popping out' when she lies down at night but she has been able to pop it back in. Tonight she lied down and now feels like her L shoulder is popped out but will not go back in. Shoulder does look different from R.

## 2013-07-03 NOTE — ED Provider Notes (Signed)
CSN: [id number]     Arrival date & time 07/02/13  2345 History   First MD Initiated Contact with Patient 07/03/13 0015     Chief Complaint  Patient presents with  . Shoulder Injury  . Fall   HPI Patient reports she fell off her porch one week ago and injured her left shoulder.  She's continued to have pain in her left shoulder since then.  Now she reports that over the past several nights which lies down it feels like it pops out but usually it has been popping back in.  Tonight she lied down and it felt as though it popped out but was not able to go back into place.  She's never had a shoulder dislocation before.  She denies head injury or neck pain.  No weakness in her left arm.  No chest pain or shortness of breath.  No abdominal pain.  Her pain is mild to moderate in severity at this time.  Her pain is worsened by movement and palpation of her left shoulder. Past Medical History  Diagnosis Date  . CAD (coronary artery disease)     multivessel  . Hypercholesterolemia   . Hypertension   . Osteopenia   . Hiatal hernia   . Vitamin B12 deficiency   . Iron deficiency    Past Surgical History  Procedure Laterality Date  . Coronary artery bypass graft      x 5  . Left breast biopsy    . Partial hystercetomy     Family History  Problem Relation Age of Onset  . Stroke Father   . Heart attack Mother    History  Substance Use Topics  . Smoking status: Former Games developer  . Smokeless tobacco: Not on file  . Alcohol Use: No   OB History   Grav Para Term Preterm Abortions TAB SAB Ect Mult Living                 Review of Systems  All other systems reviewed and are negative.    Allergies  Review of patient's allergies indicates no known allergies.  Home Medications   Current Outpatient Rx  Name  Route  Sig  Dispense  Refill  . aspirin 81 MG tablet   Oral   Take 81 mg by mouth daily.         Marland Kitchen BENICAR 40 MG tablet               . bisoprolol-hydrochlorothiazide (ZIAC)  5-6.25 MG per tablet   Oral   Take 1 tablet by mouth daily.         Marland Kitchen ezetimibe-simvastatin (VYTORIN) 10-40 MG per tablet   Oral   Take 1 tablet by mouth at bedtime.         Marland Kitchen HYDROcodone-acetaminophen (NORCO/VICODIN) 5-325 MG per tablet   Oral   Take 1 tablet by mouth every 6 (six) hours as needed for pain.   10 tablet   0   . Multiple Vitamin (MULTIVITAMIN) tablet   Oral   Take 1 tablet by mouth daily.         . OnabotulinumtoxinA (BOTOX IJ)   Injection   Inject as directed every 3 (three) months.         . predniSONE (DELTASONE) 5 MG tablet   Oral   Take 5 mg by mouth daily.         . vitamin C (ASCORBIC ACID) 500 MG tablet   Oral   Take 500  mg by mouth daily.          BP 159/60  Pulse 64  Temp(Src) 98.2 F (36.8 C) (Oral)  Resp 18  Ht 5' (1.524 m)  Wt 125 lb (56.7 kg)  BMI 24.41 kg/m2  SpO2 97% Physical Exam  Nursing note and vitals reviewed. Constitutional: She is oriented to person, place, and time. She appears well-developed and well-nourished.  HENT:  Head: Normocephalic.  Eyes: EOM are normal.  Neck: Normal range of motion. Neck supple.  C-spine nontender.  C-spine cleared by Nexus criteria.  Cardiovascular: Normal rate.   Pulmonary/Chest: Effort normal.  Abdominal: Soft. She exhibits no distension.  Musculoskeletal: Normal range of motion.  Squaring off of left shoulder.  Likely left shoulder anterior dislocation on examination.  Normal sensation in her deltoid region.  Normal left radial pulse.  Normal grip strength in her left hand.  Neurological: She is alert and oriented to person, place, and time.  Psychiatric: She has a normal mood and affect.    ED Course  Procedures (including critical care time)  Reduction of dislocation Date/Time: 03/11/2013 11:41 AM Performed by: Lyanne Co Authorized by: Lyanne Co Consent: Verbal consent obtained. Risks and benefits: risks, benefits and alternatives were discussed Consent  given by: patient Required items: required blood products, implants, devices, and special equipment available Time out: Immediately prior to procedure a "time out" was called to verify the correct patient, procedure, equipment, support staff and site/side marked as required. Patient sedated: NO Vitals: Vital signs were monitored during sedation. Patient tolerance: Patient tolerated the procedure well with no immediate complications. Joint: left shoulder Reduction technique: abduction and external rotation    Labs Review Labs Reviewed - No data to display Imaging Review Dg Shoulder Left  07/03/2013   CLINICAL DATA:  Left shoulder pain since a fall 07/01/2013.  EXAM: LEFT SHOULDER - 2+ VIEW  COMPARISON:  None.  FINDINGS: The humeral head is anteriorly dislocated. No fracture is identified. The acromioclavicular joint is intact. Images left lung and ribs appear normal.  IMPRESSION: Anterior dislocation left shoulder.   Electronically Signed   By: Drusilla Kanner M.D.   On: 07/03/2013 00:54   Dg Shoulder Left Port  07/03/2013   CLINICAL DATA:  Status post reduction dislocation.  EXAM: PORTABLE LEFT SHOULDER - 2+ VIEW  COMPARISON:  Plain films left shoulder is 07/03/2013 at 12:27 a.m.  FINDINGS: The humerus has been reduced. No new abnormality is identified.  IMPRESSION: Successful reduction of dislocation. No new abnormality.   Electronically Signed   By: Drusilla Kanner M.D.   On: 07/03/2013 02:08  I personally reviewed the imaging tests through PACS system I reviewed available ER/hospitalization records through the EMR   MDM   1. Dislocated shoulder, left, initial encounter    Left shoulder reduction performed without any sedation.  Patient tolerated the procedure well.  Post reduction x-ray with normal alignment and no fracture.  It sounds like this occurred after a fall one week ago.  It sounds like it's been dislocating and relocating at home.  She likely has ligamentous injury.   Outpatient orthopedic followup.  Likely will need an MRI.    Lyanne Co, MD 07/03/13 [phone]

## 2013-07-09 ENCOUNTER — Encounter (HOSPITAL_COMMUNITY): Payer: Self-pay | Admitting: *Deleted

## 2013-07-09 ENCOUNTER — Emergency Department (HOSPITAL_COMMUNITY)
Admission: EM | Admit: 2013-07-09 | Discharge: 2013-07-09 | Disposition: A | Discharge status: Home / Self Care | Payer: Medicare Other | Attending: Emergency Medicine | Admitting: Emergency Medicine

## 2013-07-09 ENCOUNTER — Emergency Department (HOSPITAL_COMMUNITY): Payer: Medicare Other

## 2013-07-09 DIAGNOSIS — Z862 Personal history of diseases of the blood and blood-forming organs and certain disorders involving the immune mechanism: Secondary | ICD-10-CM | POA: Diagnosis not present

## 2013-07-09 DIAGNOSIS — Z8719 Personal history of other diseases of the digestive system: Secondary | ICD-10-CM | POA: Insufficient documentation

## 2013-07-09 DIAGNOSIS — M25519 Pain in unspecified shoulder: Secondary | ICD-10-CM | POA: Diagnosis not present

## 2013-07-09 DIAGNOSIS — Z951 Presence of aortocoronary bypass graft: Secondary | ICD-10-CM | POA: Diagnosis not present

## 2013-07-09 DIAGNOSIS — I2510 Atherosclerotic heart disease of native coronary artery without angina pectoris: Secondary | ICD-10-CM | POA: Diagnosis not present

## 2013-07-09 DIAGNOSIS — Y9339 Activity, other involving climbing, rappelling and jumping off: Secondary | ICD-10-CM | POA: Insufficient documentation

## 2013-07-09 DIAGNOSIS — S43005A Unspecified dislocation of left shoulder joint, initial encounter: Secondary | ICD-10-CM

## 2013-07-09 DIAGNOSIS — Z7982 Long term (current) use of aspirin: Secondary | ICD-10-CM | POA: Insufficient documentation

## 2013-07-09 DIAGNOSIS — E7800 Pure hypercholesterolemia, unspecified: Secondary | ICD-10-CM | POA: Diagnosis not present

## 2013-07-09 DIAGNOSIS — Y929 Unspecified place or not applicable: Secondary | ICD-10-CM | POA: Insufficient documentation

## 2013-07-09 DIAGNOSIS — M899 Disorder of bone, unspecified: Secondary | ICD-10-CM | POA: Insufficient documentation

## 2013-07-09 DIAGNOSIS — Z4789 Encounter for other orthopedic aftercare: Secondary | ICD-10-CM | POA: Diagnosis not present

## 2013-07-09 DIAGNOSIS — Z87891 Personal history of nicotine dependence: Secondary | ICD-10-CM | POA: Insufficient documentation

## 2013-07-09 DIAGNOSIS — S43006A Unspecified dislocation of unspecified shoulder joint, initial encounter: Secondary | ICD-10-CM | POA: Insufficient documentation

## 2013-07-09 DIAGNOSIS — X500XXA Overexertion from strenuous movement or load, initial encounter: Secondary | ICD-10-CM | POA: Insufficient documentation

## 2013-07-09 DIAGNOSIS — Z79899 Other long term (current) drug therapy: Secondary | ICD-10-CM | POA: Diagnosis not present

## 2013-07-09 DIAGNOSIS — IMO0002 Reserved for concepts with insufficient information to code with codable children: Secondary | ICD-10-CM | POA: Diagnosis not present

## 2013-07-09 DIAGNOSIS — I10 Essential (primary) hypertension: Secondary | ICD-10-CM | POA: Insufficient documentation

## 2013-07-09 DIAGNOSIS — S4980XA Other specified injuries of shoulder and upper arm, unspecified arm, initial encounter: Secondary | ICD-10-CM | POA: Diagnosis not present

## 2013-07-09 MED ORDER — HYDROCODONE-ACETAMINOPHEN 5-325 MG PO TABS: 1.0000 | ORAL_TABLET | Freq: Four times a day (QID) | ORAL | Status: DC | PRN

## 2013-07-09 MED ORDER — OXYCODONE-ACETAMINOPHEN 5-325 MG PO TABS
1.0000 | ORAL_TABLET | Freq: Once | ORAL | Status: AC
  Administered 2013-07-09: 1 via ORAL
  Filled 2013-07-09: qty 1

## 2013-07-09 NOTE — ED Provider Notes (Signed)
Medical screening examination/treatment/procedure(s) were conducted as a shared visit with non-physician practitioner(s) and myself.  I personally evaluated the patient during the encounter.  Pt with recent shoulder dislocation, tonight boosted self back onto the bed and felt it dislocate.  Ivonne Andrew attempted to reduce the shoulder but was unable.  With patient's consent, I gently externally rotated her left arm, then abducted through complete upward ROM.  With this, she had good reduction of the dislocation.  Pt NVI intact after the procedure, feeling much better.  Xrays confirm reduction.  Pt instructed to wear sling until seen by ortho.  Olivia Mackie, MD 07/09/13 [phone]

## 2013-07-09 NOTE — ED Notes (Signed)
Pt states that her left arm is sore.

## 2013-07-09 NOTE — ED Notes (Signed)
Patient transported to X-ray 

## 2013-07-09 NOTE — ED Notes (Signed)
Pt was seen here last Tuesday for dislocated shoulder,  She was put in sling and told to keep in sling but she was trying to get in bed tonight and thought if she took her sling off she could get in bed better and her arm "popped" out of place again she said and states now her right arm is also hurting maybe from over compensating

## 2013-07-09 NOTE — ED Provider Notes (Addendum)
CSN: [id number]     Arrival date & time 07/09/13  0021 History   First MD Initiated Contact with Patient 07/09/13 0047     Chief Complaint  Patient presents with  . Arm Injury   HPI  History provided by the patient. Patient is a 79 year old female with history of hypertension, hypercholesterolemia, CAD who presents with complaints of left shoulder pain and concern for dislocation. Patient was recently seen in the emergency room with left shoulder pain and found to have a dislocation. This was reduced the patient has been instructed to wear a sling for healing. Patient admits that she has been taking this on and off and continuing to use her arm. Tonight she had her sling off and was climbing into bed pushing down on her left arm when she felt a pop and movement in her shoulder similar to her dislocation previously. Since then she has had pain to the shoulder worse than baseline. There was also reduced range of motion.  She denies any numbness or weakness in her fingers. No other aggravating or alleviating factors. No other associated symptoms.     Past Medical History  Diagnosis Date  . CAD (coronary artery disease)     multivessel  . Hypercholesterolemia   . Hypertension   . Osteopenia   . Hiatal hernia   . Vitamin B12 deficiency   . Iron deficiency    Past Surgical History  Procedure Laterality Date  . Coronary artery bypass graft      x 5  . Left breast biopsy    . Partial hystercetomy     Family History  Problem Relation Age of Onset  . Stroke Father   . Heart attack Mother    History  Substance Use Topics  . Smoking status: Former Games developer  . Smokeless tobacco: Not on file  . Alcohol Use: No   OB History   Grav Para Term Preterm Abortions TAB SAB Ect Mult Living                 Review of Systems  Neurological: Negative for weakness and numbness.  All other systems reviewed and are negative.    Allergies  Review of patient's allergies indicates no known  allergies.  Home Medications   Current Outpatient Rx  Name  Route  Sig  Dispense  Refill  . acetaminophen (TYLENOL) 500 MG tablet   Oral   Take 1,000 mg by mouth every 6 (six) hours as needed for pain (pain).         Marland Kitchen aspirin 81 MG tablet   Oral   Take 81 mg by mouth daily.         Marland Kitchen BENICAR 40 MG tablet   Oral   Take 20 mg by mouth daily. Been taking 1/2 tab for a month         . bisoprolol-hydrochlorothiazide (ZIAC) 5-6.25 MG per tablet   Oral   Take 1 tablet by mouth daily.         Marland Kitchen ezetimibe-simvastatin (VYTORIN) 10-40 MG per tablet   Oral   Take 1 tablet by mouth at bedtime.         Marland Kitchen HYDROcodone-acetaminophen (NORCO/VICODIN) 5-325 MG per tablet   Oral   Take 1 tablet by mouth every 6 (six) hours as needed for pain.   10 tablet   0   . Multiple Vitamin (MULTIVITAMIN) tablet   Oral   Take 1 tablet by mouth daily.         Marland Kitchen  OnabotulinumtoxinA (BOTOX IJ)   Injection   Inject as directed every 3 (three) months.         . predniSONE (DELTASONE) 5 MG tablet   Oral   Take 5 mg by mouth daily.         . vitamin C (ASCORBIC ACID) 500 MG tablet   Oral   Take 500 mg by mouth daily.          BP 188/73  Pulse 61  Temp(Src) 98.1 F (36.7 C) (Oral)  Resp 20  SpO2 99% Physical Exam  Nursing note and vitals reviewed. Constitutional: She is oriented to person, place, and time. She appears well-developed and well-nourished. No distress.  HENT:  Head: Normocephalic and atraumatic.  Eyes: Conjunctivae are normal.  Cardiovascular: Normal rate and regular rhythm.   Pulmonary/Chest: Effort normal and breath sounds normal. No respiratory distress. She has no wheezes. She has no rales.  Musculoskeletal:  Tenderness over the left shoulder. There is no appreciable deformity. There is reduced range of motion is actually with abduction secondary to pain. Normal forward flexion and extension at the shoulder. Normal lateral sensations to the deltoid. Normal  distal radial pulses, good strength and sensation in hands.  Unremarkable right upper extremity exam.  Neurological: She is alert and oriented to person, place, and time.  Skin: Skin is warm and dry. No rash noted.  Psychiatric: She has a normal mood and affect. Her behavior is normal.    ED Course  Procedures    Imaging Review Dg Shoulder Left  07/09/2013   CLINICAL DATA:  Postreduction.  EXAM: LEFT SHOULDER - 2+ VIEW  COMPARISON:  07/09/2013  FINDINGS: Interval reduction of the left shoulder. No visible fracture. Degenerative changes in the left AC joint.  IMPRESSION: Interval reduction. No visible fracture.   Electronically Signed   By: Charlett Nose M.D.   On: 07/09/2013 03:02   Dg Shoulder Left  07/09/2013   *RADIOLOGY REPORT*  Clinical Data: Arm injury  LEFT SHOULDER - 2+ VIEW  Comparison: Prior radiograph from 07/03/2013  Findings: The left humeral head is dislocated anteriorly and inferiorly relative to its expected location with the glenoid. There is no associated Hill-Sachs deformity or other fracture.  AC joint is approximated.  These findings are similar as compared to the prior radiograph from 07/03/2013.  No soft tissue abnormality.  Patchy opacity is present within the left lung base, incompletely visualized.  IMPRESSION: Anterior dislocation of the left shoulder, similar to prior study from 07/03/2013.   Original Report Authenticated By: Rise Mu, M.D.    MDM   1. Shoulder dislocation, left, initial encounter      Patient seen and evaluated. Patient appears uncomfortable but in no acute distress. There is no appreciable asymmetry of her left shoulder. She does have reduced range of motion secondary to pain. Will obtain x-rays for further imaging. Percocet ordered for pain.  Patient was seen and evaluated with attending physician. Dr. Norlene Campbell was able to relocate the left shoulder with gentle manipulation. Patient tolerated well only mild to moderate  discomfort.  Postreduction films confirm reduction. Patient will be discharged at this time instructed to followup with orthopedic specialist.     Angus Seller, PA-C 07/09/13 [phone]

## 2013-07-09 NOTE — ED Provider Notes (Signed)
Medical screening examination/treatment/procedure(s) were performed by non-physician practitioner and as supervising physician I was immediately available for consultation/collaboration.  Gaspard Isbell M Callan Yontz, MD 07/09/13 0532 

## 2013-07-09 NOTE — ED Notes (Signed)
IV held per pt request at this time,  States last reduction a week ago didn't use IV only po percocet and she would like to do this way again if possible

## 2013-07-14 DIAGNOSIS — S4980XA Other specified injuries of shoulder and upper arm, unspecified arm, initial encounter: Secondary | ICD-10-CM | POA: Diagnosis not present

## 2013-07-14 DIAGNOSIS — S43006A Unspecified dislocation of unspecified shoulder joint, initial encounter: Secondary | ICD-10-CM | POA: Diagnosis not present

## 2013-07-18 DIAGNOSIS — S43006A Unspecified dislocation of unspecified shoulder joint, initial encounter: Secondary | ICD-10-CM | POA: Diagnosis not present

## 2013-07-19 DIAGNOSIS — I10 Essential (primary) hypertension: Secondary | ICD-10-CM | POA: Diagnosis not present

## 2013-07-19 DIAGNOSIS — Z23 Encounter for immunization: Secondary | ICD-10-CM | POA: Diagnosis not present

## 2013-07-19 DIAGNOSIS — M79609 Pain in unspecified limb: Secondary | ICD-10-CM | POA: Diagnosis not present

## 2013-08-01 DIAGNOSIS — M25519 Pain in unspecified shoulder: Secondary | ICD-10-CM | POA: Diagnosis not present

## 2013-08-07 DIAGNOSIS — M25519 Pain in unspecified shoulder: Secondary | ICD-10-CM | POA: Diagnosis not present

## 2013-08-09 DIAGNOSIS — M25519 Pain in unspecified shoulder: Secondary | ICD-10-CM | POA: Diagnosis not present

## 2013-08-13 DIAGNOSIS — M25519 Pain in unspecified shoulder: Secondary | ICD-10-CM | POA: Diagnosis not present

## 2013-08-15 DIAGNOSIS — M25519 Pain in unspecified shoulder: Secondary | ICD-10-CM | POA: Diagnosis not present

## 2013-08-16 DIAGNOSIS — M25519 Pain in unspecified shoulder: Secondary | ICD-10-CM | POA: Diagnosis not present

## 2013-08-21 DIAGNOSIS — M25519 Pain in unspecified shoulder: Secondary | ICD-10-CM | POA: Diagnosis not present

## 2013-08-23 DIAGNOSIS — M25519 Pain in unspecified shoulder: Secondary | ICD-10-CM | POA: Diagnosis not present

## 2013-08-27 DIAGNOSIS — M353 Polymyalgia rheumatica: Secondary | ICD-10-CM | POA: Diagnosis not present

## 2013-08-28 DIAGNOSIS — M25519 Pain in unspecified shoulder: Secondary | ICD-10-CM | POA: Diagnosis not present

## 2013-08-30 DIAGNOSIS — M25519 Pain in unspecified shoulder: Secondary | ICD-10-CM | POA: Diagnosis not present

## 2013-09-11 DIAGNOSIS — G245 Blepharospasm: Secondary | ICD-10-CM | POA: Diagnosis not present

## 2013-09-13 DIAGNOSIS — M25519 Pain in unspecified shoulder: Secondary | ICD-10-CM | POA: Diagnosis not present

## 2013-09-14 DIAGNOSIS — J385 Laryngeal spasm: Secondary | ICD-10-CM | POA: Diagnosis not present

## 2013-12-13 DIAGNOSIS — I10 Essential (primary) hypertension: Secondary | ICD-10-CM | POA: Diagnosis not present

## 2013-12-13 DIAGNOSIS — E7800 Pure hypercholesterolemia, unspecified: Secondary | ICD-10-CM | POA: Diagnosis not present

## 2013-12-13 DIAGNOSIS — R634 Abnormal weight loss: Secondary | ICD-10-CM | POA: Diagnosis not present

## 2013-12-18 ENCOUNTER — Encounter: Payer: Self-pay | Admitting: Cardiovascular Disease

## 2013-12-18 DIAGNOSIS — G245 Blepharospasm: Secondary | ICD-10-CM | POA: Diagnosis not present

## 2013-12-18 DIAGNOSIS — R300 Dysuria: Secondary | ICD-10-CM | POA: Diagnosis not present

## 2013-12-20 DIAGNOSIS — E7800 Pure hypercholesterolemia, unspecified: Secondary | ICD-10-CM | POA: Diagnosis not present

## 2013-12-20 DIAGNOSIS — I10 Essential (primary) hypertension: Secondary | ICD-10-CM | POA: Diagnosis not present

## 2013-12-20 DIAGNOSIS — M810 Age-related osteoporosis without current pathological fracture: Secondary | ICD-10-CM | POA: Diagnosis not present

## 2013-12-25 DIAGNOSIS — M353 Polymyalgia rheumatica: Secondary | ICD-10-CM | POA: Diagnosis not present

## 2014-02-01 IMAGING — CR DG SHOULDER 2+V*L*
2 series · 2 of 2 positions shown · non-contrast
Comparison: None.

CLINICAL DATA: Left shoulder pain since a fall 07/01/2013.

EXAM:
LEFT SHOULDER - 2+ VIEW

[w shoulder internal left]
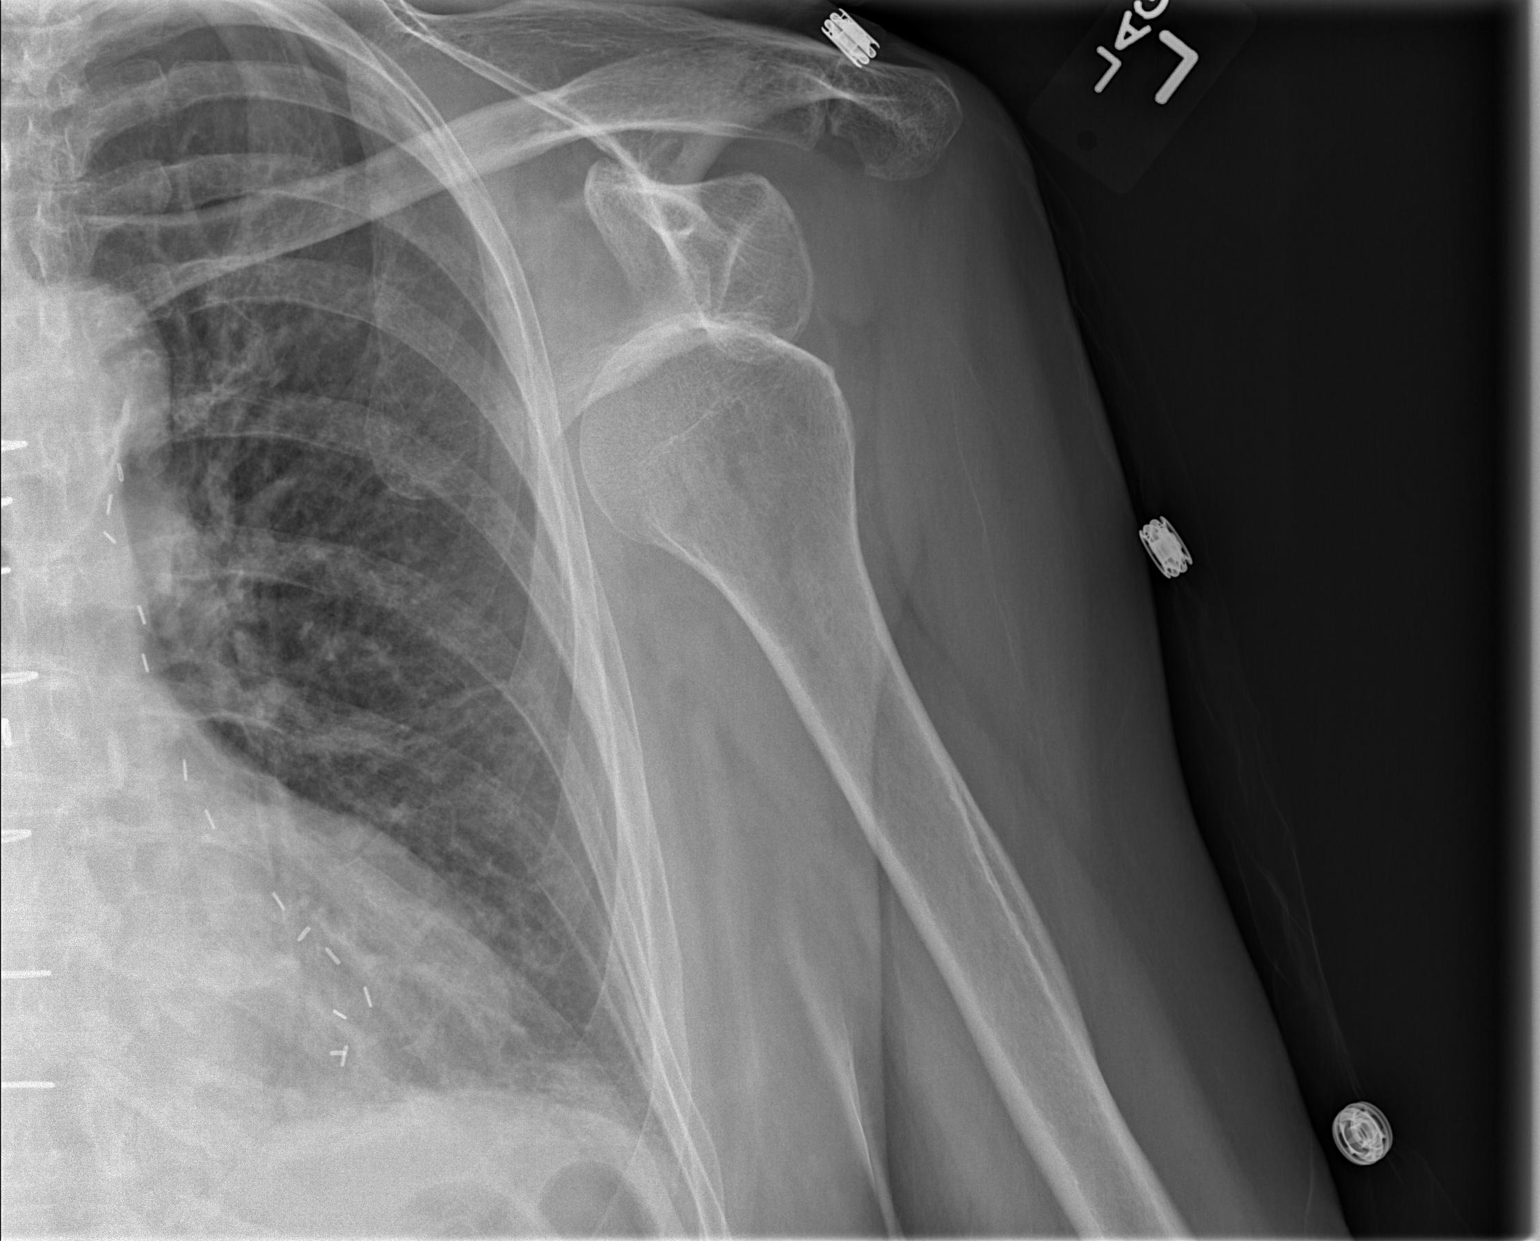

[w shoulder external left]
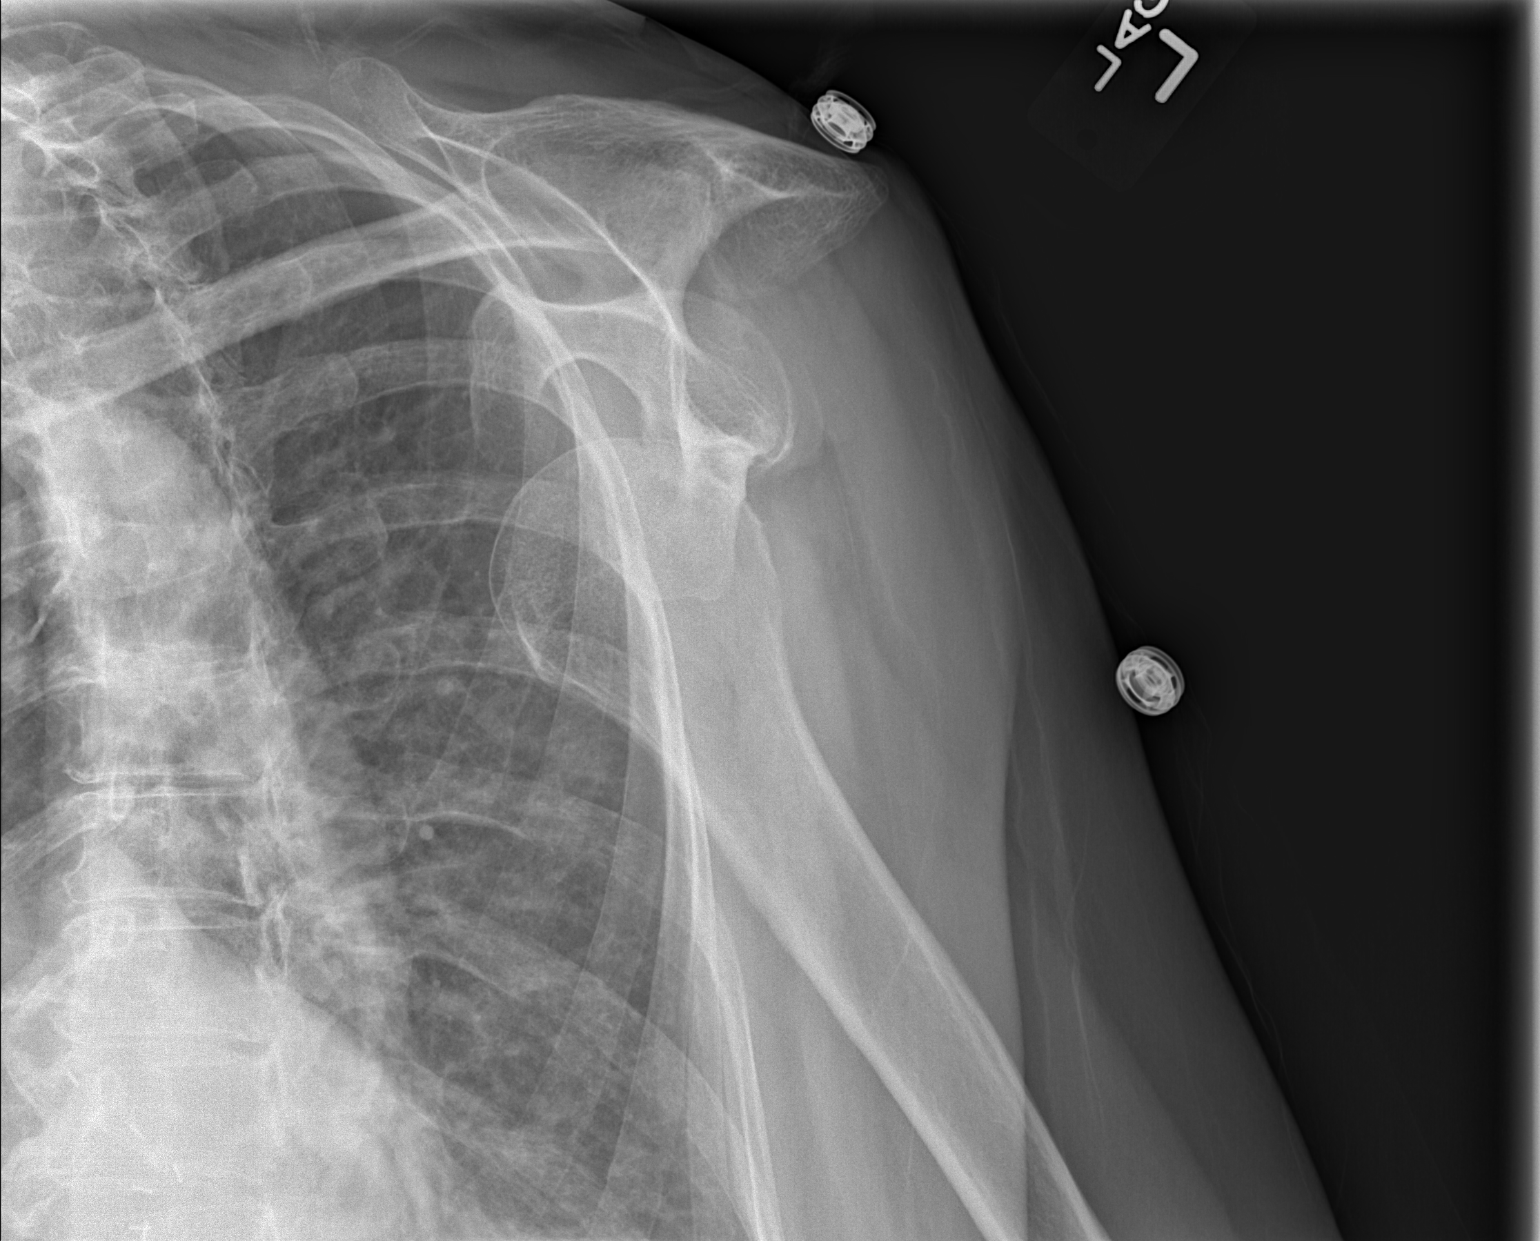

[2 of 2 positions shown; findings below may reference images not displayed]

FINDINGS: The humeral head is anteriorly dislocated. No fracture is
identified. The acromioclavicular joint is intact. Images left lung
and ribs appear normal.
IMPRESSION: Anterior dislocation left shoulder.

## 2014-02-01 IMAGING — CR DG SHOULDER 1V*L*
1 series · 2 of 2 positions shown · non-contrast
Comparison: Plain films left shoulder is 07/03/2013 at [DATE] a.m.

CLINICAL DATA: Status post reduction dislocation.

EXAM:
PORTABLE LEFT SHOULDER - 2+ VIEW

[Series 1: AP · U · 2 of 2 slices shown]
[im 1/2]
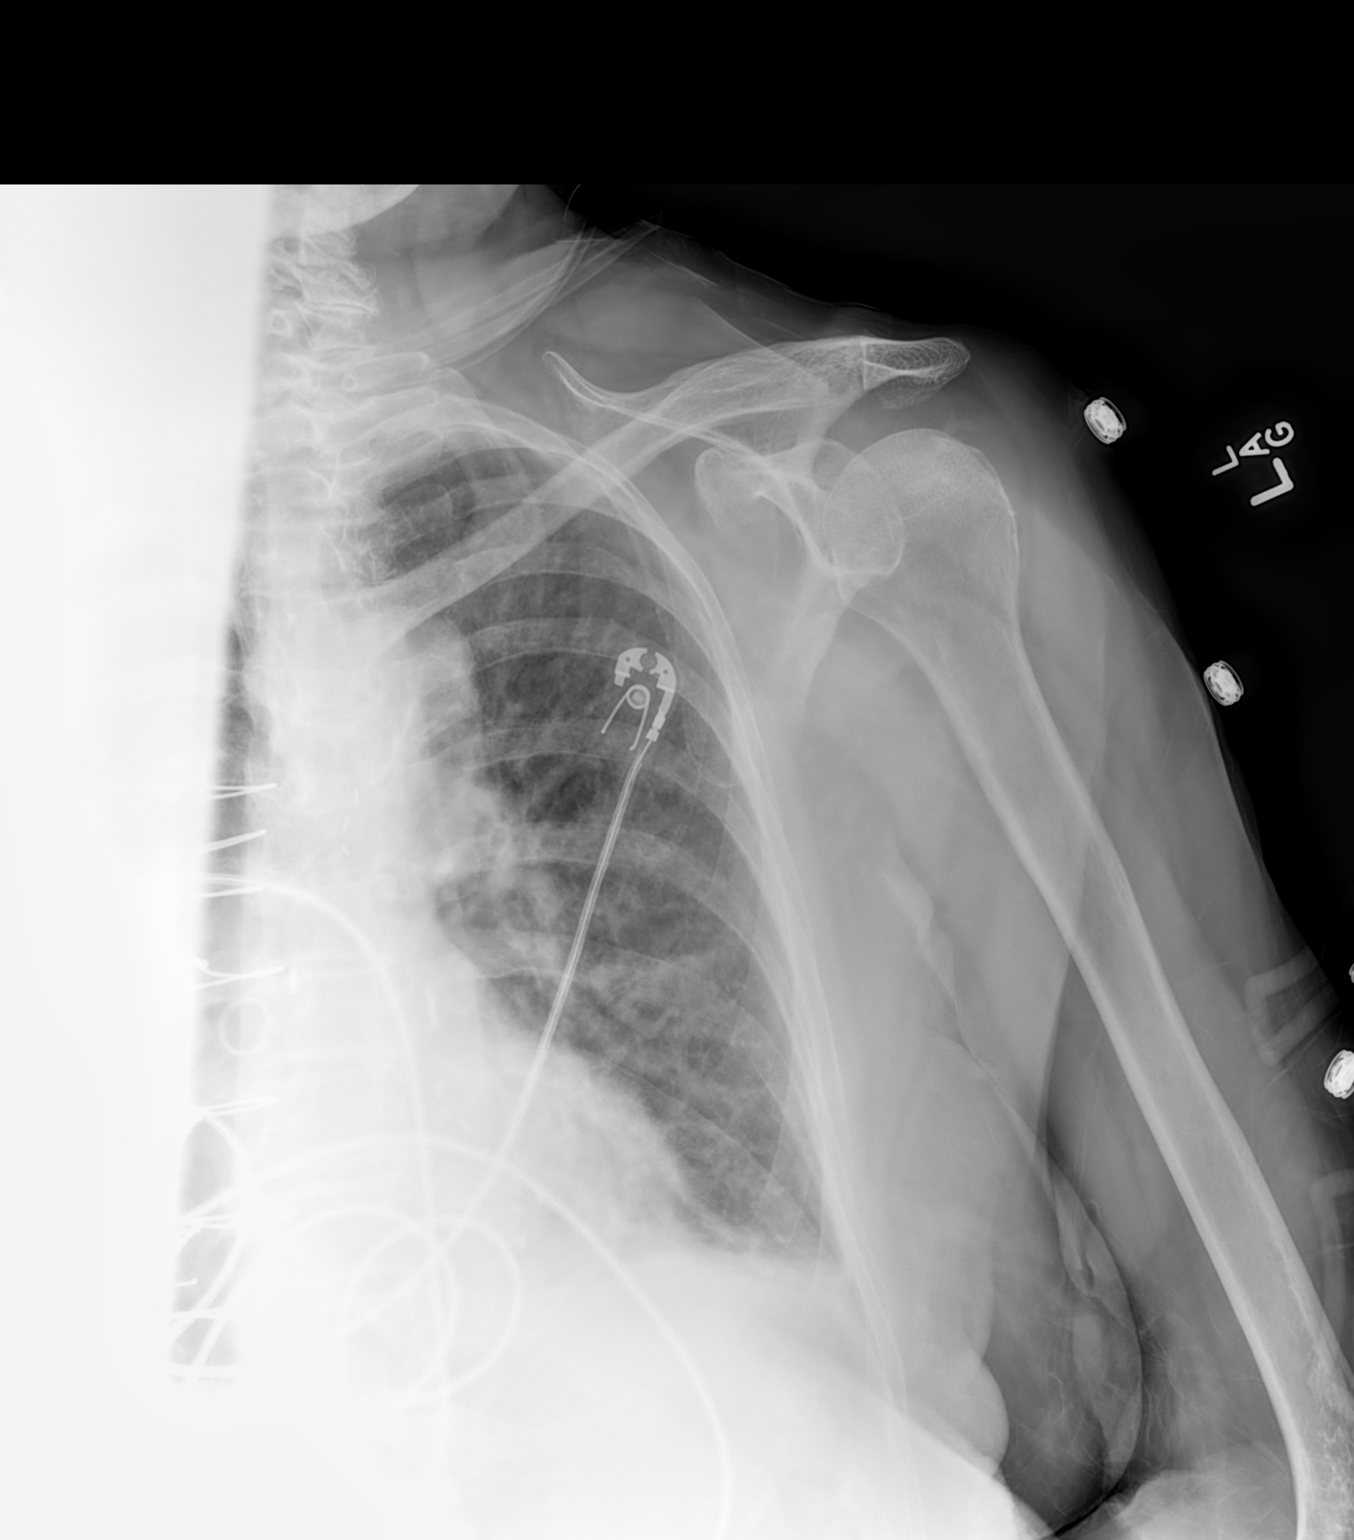
[im 2/2]
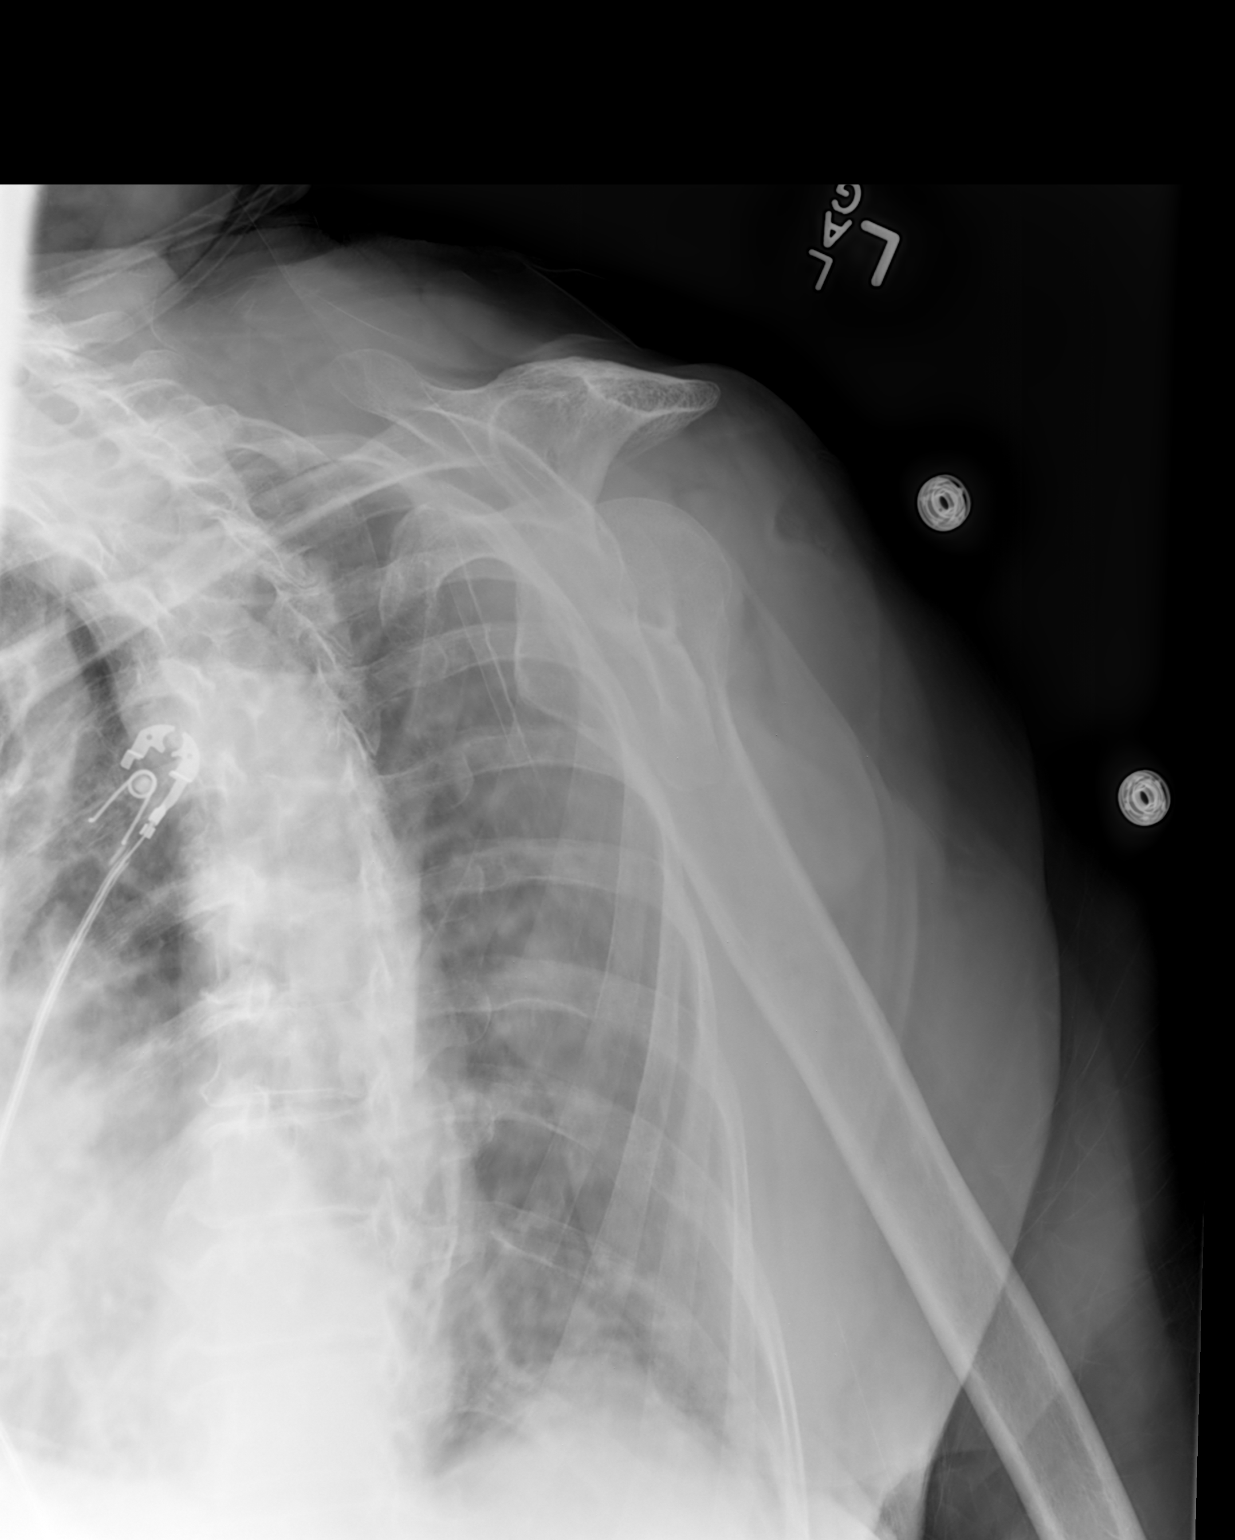

[2 of 2 positions shown; findings below may reference images not displayed]

FINDINGS: The humerus has been reduced. No new abnormality is identified.
IMPRESSION: Successful reduction of dislocation. No new abnormality.

## 2014-02-07 IMAGING — CR DG SHOULDER 2+V*L*
1 series · 1 of 1 positions shown · non-contrast
Comparison: Prior radiograph from 07/03/2013

CLINICAL DATA: Arm injury

LEFT SHOULDER - 2+ VIEW

[x shoulder ap left]
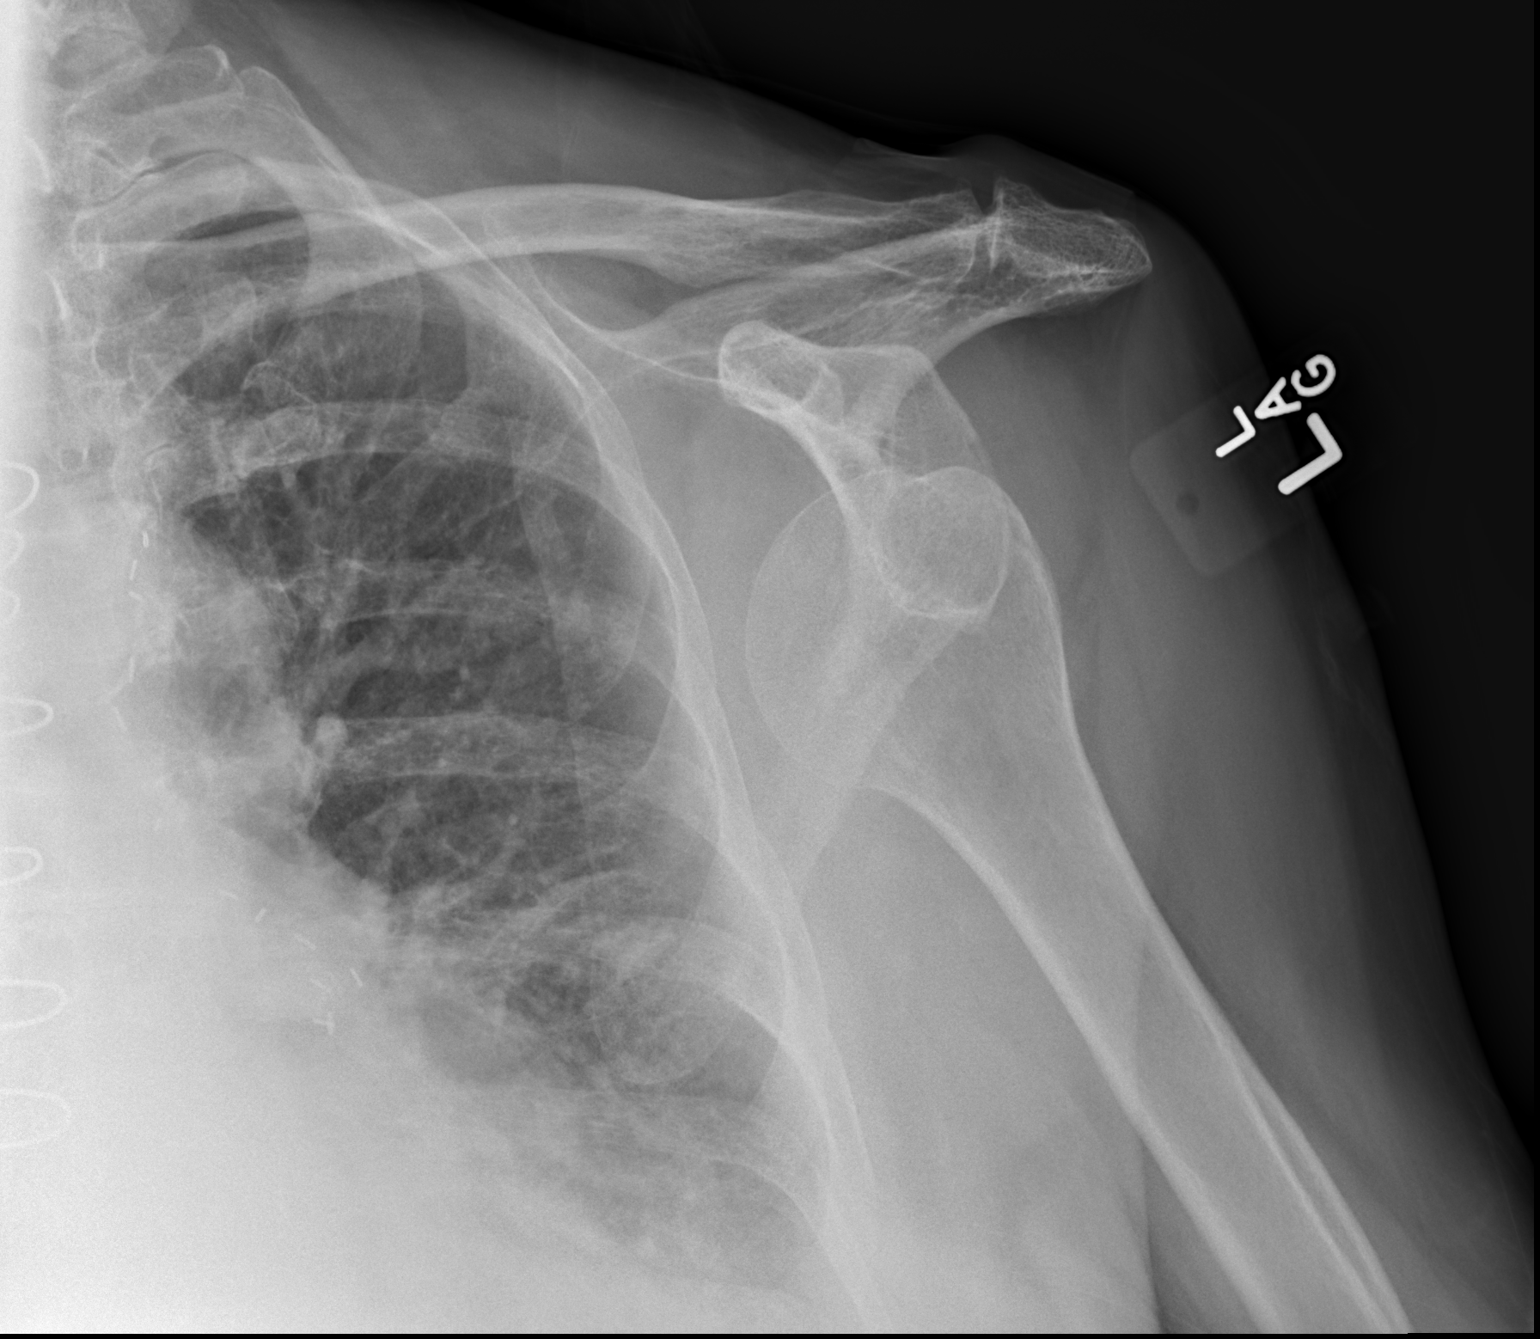

[1 of 1 positions shown; findings below may reference images not displayed]

FINDINGS: The left humeral head is dislocated anteriorly and
inferiorly relative to its expected location with the glenoid.
There is no associated Hill-Sachs deformity or other fracture.  AC
joint is approximated.  These findings are similar as compared to
the prior radiograph from 07/03/2013.

No soft tissue abnormality.  Patchy opacity is present within the
left lung base, incompletely visualized.
IMPRESSION: Anterior dislocation of the left shoulder, similar to prior study
from 07/03/2013.

## 2014-02-14 DIAGNOSIS — M353 Polymyalgia rheumatica: Secondary | ICD-10-CM | POA: Diagnosis not present

## 2014-03-08 DIAGNOSIS — J385 Laryngeal spasm: Secondary | ICD-10-CM | POA: Diagnosis not present

## 2014-03-25 DIAGNOSIS — B029 Zoster without complications: Secondary | ICD-10-CM | POA: Diagnosis not present

## 2014-03-26 DIAGNOSIS — G245 Blepharospasm: Secondary | ICD-10-CM | POA: Diagnosis not present

## 2014-04-25 DIAGNOSIS — M353 Polymyalgia rheumatica: Secondary | ICD-10-CM | POA: Diagnosis not present

## 2014-04-26 ENCOUNTER — Inpatient Hospital Stay (HOSPITAL_COMMUNITY): Payer: Medicare Other

## 2014-04-26 ENCOUNTER — Inpatient Hospital Stay (HOSPITAL_COMMUNITY)
Admission: EM | Admit: 2014-04-26 | Discharge: 2014-04-30 | DRG: 062 | Disposition: A | Discharge status: Home / Self Care | Payer: Medicare Other | Attending: Neurology | Admitting: Neurology

## 2014-04-26 ENCOUNTER — Emergency Department (HOSPITAL_COMMUNITY): Payer: Medicare Other

## 2014-04-26 ENCOUNTER — Encounter (HOSPITAL_COMMUNITY): Payer: Self-pay | Admitting: Emergency Medicine

## 2014-04-26 DIAGNOSIS — I369 Nonrheumatic tricuspid valve disorder, unspecified: Secondary | ICD-10-CM | POA: Diagnosis not present

## 2014-04-26 DIAGNOSIS — IMO0002 Reserved for concepts with insufficient information to code with codable children: Secondary | ICD-10-CM

## 2014-04-26 DIAGNOSIS — I6340 Cerebral infarction due to embolism of unspecified cerebral artery: Principal | ICD-10-CM | POA: Diagnosis present

## 2014-04-26 DIAGNOSIS — I2510 Atherosclerotic heart disease of native coronary artery without angina pectoris: Secondary | ICD-10-CM | POA: Diagnosis present

## 2014-04-26 DIAGNOSIS — Z79899 Other long term (current) drug therapy: Secondary | ICD-10-CM | POA: Diagnosis not present

## 2014-04-26 DIAGNOSIS — I6529 Occlusion and stenosis of unspecified carotid artery: Secondary | ICD-10-CM | POA: Diagnosis not present

## 2014-04-26 DIAGNOSIS — E440 Moderate protein-calorie malnutrition: Secondary | ICD-10-CM | POA: Diagnosis present

## 2014-04-26 DIAGNOSIS — G8190 Hemiplegia, unspecified affecting unspecified side: Secondary | ICD-10-CM | POA: Diagnosis present

## 2014-04-26 DIAGNOSIS — J387 Other diseases of larynx: Secondary | ICD-10-CM | POA: Diagnosis present

## 2014-04-26 DIAGNOSIS — I10 Essential (primary) hypertension: Secondary | ICD-10-CM | POA: Diagnosis present

## 2014-04-26 DIAGNOSIS — Z87891 Personal history of nicotine dependence: Secondary | ICD-10-CM | POA: Diagnosis not present

## 2014-04-26 DIAGNOSIS — R29810 Facial weakness: Secondary | ICD-10-CM | POA: Diagnosis present

## 2014-04-26 DIAGNOSIS — R471 Dysarthria and anarthria: Secondary | ICD-10-CM | POA: Diagnosis present

## 2014-04-26 DIAGNOSIS — Z7982 Long term (current) use of aspirin: Secondary | ICD-10-CM

## 2014-04-26 DIAGNOSIS — I6789 Other cerebrovascular disease: Secondary | ICD-10-CM | POA: Diagnosis not present

## 2014-04-26 DIAGNOSIS — Z951 Presence of aortocoronary bypass graft: Secondary | ICD-10-CM | POA: Diagnosis not present

## 2014-04-26 DIAGNOSIS — R4789 Other speech disturbances: Secondary | ICD-10-CM | POA: Diagnosis not present

## 2014-04-26 DIAGNOSIS — I4891 Unspecified atrial fibrillation: Secondary | ICD-10-CM | POA: Diagnosis present

## 2014-04-26 DIAGNOSIS — E785 Hyperlipidemia, unspecified: Secondary | ICD-10-CM | POA: Diagnosis present

## 2014-04-26 DIAGNOSIS — I639 Cerebral infarction, unspecified: Secondary | ICD-10-CM | POA: Diagnosis present

## 2014-04-26 DIAGNOSIS — I6350 Cerebral infarction due to unspecified occlusion or stenosis of unspecified cerebral artery: Secondary | ICD-10-CM | POA: Diagnosis not present

## 2014-04-26 LAB — DIFFERENTIAL
BASOS PCT: 0 % (ref 0–1)
Basophils Absolute: 0 10*3/uL (ref 0.0–0.1)
EOS ABS: 0.1 10*3/uL (ref 0.0–0.7)
Eosinophils Relative: 2 % (ref 0–5)
Lymphocytes Relative: 26 % (ref 12–46)
Lymphs Abs: 1.7 10*3/uL (ref 0.7–4.0)
MONOS PCT: 12 % (ref 3–12)
Monocytes Absolute: 0.8 10*3/uL (ref 0.1–1.0)
NEUTROS ABS: 4 10*3/uL (ref 1.7–7.7)
Neutrophils Relative %: 60 % (ref 43–77)

## 2014-04-26 LAB — I-STAT CHEM 8, ED
BUN: 16 mg/dL (ref 6–23)
CALCIUM ION: 1.34 mmol/L — AB (ref 1.13–1.30)
Chloride: 105 mEq/L (ref 96–112)
Creatinine, Ser: 1 mg/dL (ref 0.50–1.10)
Glucose, Bld: 110 mg/dL — ABNORMAL HIGH (ref 70–99)
HEMATOCRIT: 37 % (ref 36.0–46.0)
Hemoglobin: 12.6 g/dL (ref 12.0–15.0)
Potassium: 3.9 mEq/L (ref 3.7–5.3)
Sodium: 140 mEq/L (ref 137–147)
TCO2: 24 mmol/L (ref 0–100)

## 2014-04-26 LAB — COMPREHENSIVE METABOLIC PANEL
ALBUMIN: 3.8 g/dL (ref 3.5–5.2)
ALT: 20 U/L (ref 0–35)
AST: 31 U/L (ref 0–37)
Alkaline Phosphatase: 46 U/L (ref 39–117)
Anion gap: 15 (ref 5–15)
BILIRUBIN TOTAL: 0.3 mg/dL (ref 0.3–1.2)
BUN: 17 mg/dL (ref 6–23)
CHLORIDE: 100 meq/L (ref 96–112)
CO2: 25 mEq/L (ref 19–32)
CREATININE: 0.92 mg/dL (ref 0.50–1.10)
Calcium: 10.6 mg/dL — ABNORMAL HIGH (ref 8.4–10.5)
GFR calc Af Amer: 67 mL/min — ABNORMAL LOW (ref >90)
GFR calc non Af Amer: 58 mL/min — ABNORMAL LOW (ref >90)
Glucose, Bld: 106 mg/dL — ABNORMAL HIGH (ref 70–99)
Potassium: 4.1 mEq/L (ref 3.7–5.3)
Sodium: 140 mEq/L (ref 137–147)
Total Protein: 6.9 g/dL (ref 6.0–8.3)

## 2014-04-26 LAB — CBC
HCT: 35.1 % — ABNORMAL LOW (ref 36.0–46.0)
HEMOGLOBIN: 10.9 g/dL — AB (ref 12.0–15.0)
MCH: 25.6 pg — AB (ref 26.0–34.0)
MCHC: 31.1 g/dL (ref 30.0–36.0)
MCV: 82.4 fL (ref 78.0–100.0)
Platelets: 254 10*3/uL (ref 150–400)
RBC: 4.26 MIL/uL (ref 3.87–5.11)
RDW: 15.6 % — ABNORMAL HIGH (ref 11.5–15.5)
WBC: 6.8 10*3/uL (ref 4.0–10.5)

## 2014-04-26 LAB — ETHANOL

## 2014-04-26 LAB — PROTIME-INR
INR: 0.98 (ref 0.00–1.49)
Prothrombin Time: 13 seconds (ref 11.6–15.2)

## 2014-04-26 LAB — I-STAT TROPONIN, ED: TROPONIN I, POC: 0.02 ng/mL (ref 0.00–0.08)

## 2014-04-26 LAB — APTT: aPTT: 28 seconds (ref 24–37)

## 2014-04-26 LAB — MRSA PCR SCREENING: MRSA by PCR: NEGATIVE

## 2014-04-26 MED ORDER — SENNOSIDES-DOCUSATE SODIUM 8.6-50 MG PO TABS
1.0000 | ORAL_TABLET | Freq: Every evening | ORAL | Status: DC | PRN
  Filled 2014-04-26: qty 1

## 2014-04-26 MED ORDER — SODIUM CHLORIDE 0.9 % IV SOLN
INTRAVENOUS | Status: DC
  Administered 2014-04-26 – 2014-04-27 (×3): via INTRAVENOUS

## 2014-04-26 MED ORDER — PANTOPRAZOLE SODIUM 40 MG IV SOLR
40.0000 mg | Freq: Every day | INTRAVENOUS | Status: DC
  Administered 2014-04-26 – 2014-04-27 (×2): 40 mg via INTRAVENOUS
  Filled 2014-04-26 (×3): qty 40

## 2014-04-26 MED ORDER — IOHEXOL 350 MG/ML SOLN
50.0000 mL | Freq: Once | INTRAVENOUS | Status: AC | PRN
  Administered 2014-04-26: 50 mL via INTRAVENOUS

## 2014-04-26 MED ORDER — ACETAMINOPHEN 650 MG RE SUPP
650.0000 mg | RECTAL | Status: DC | PRN
  Administered 2014-04-26: 650 mg via RECTAL
  Filled 2014-04-26: qty 1

## 2014-04-26 MED ORDER — STROKE: EARLY STAGES OF RECOVERY BOOK
Freq: Once | Status: AC
  Administered 2014-04-26: 19:00:00
  Filled 2014-04-26: qty 1

## 2014-04-26 MED ORDER — LABETALOL HCL 5 MG/ML IV SOLN: 10.0000 mg | INTRAVENOUS | Status: DC | PRN

## 2014-04-26 MED ORDER — ALTEPLASE (STROKE) FULL DOSE INFUSION
42.0000 mg | Freq: Once | INTRAVENOUS | Status: AC
  Administered 2014-04-26: 42 mg via INTRAVENOUS
  Filled 2014-04-26: qty 42

## 2014-04-26 MED ORDER — ACETAMINOPHEN 325 MG PO TABS
650.0000 mg | ORAL_TABLET | ORAL | Status: DC | PRN
Start: 2014-04-26 — End: 2014-04-30
  Administered 2014-04-30: 650 mg via ORAL
  Filled 2014-04-26: qty 2

## 2014-04-26 MED ORDER — ONDANSETRON HCL 4 MG/2ML IJ SOLN
4.0000 mg | Freq: Once | INTRAMUSCULAR | Status: AC
  Administered 2014-04-26: 4 mg via INTRAVENOUS
  Filled 2014-04-26: qty 2

## 2014-04-26 NOTE — ED Provider Notes (Signed)
CSN: [id number]     Arrival date & time 04/26/14  1743 History   First MD Initiated Contact with Patient 04/26/14 1748     Chief Complaint  Patient presents with  . Code Stroke     (Consider location/radiation/quality/duration/timing/severity/associated sxs/prior Treatment) HPI  [name] [name] is a 80 y.o. female she presents urgently by EMS for evaluation of left-sided weakness, which started/was noticed at 1648 today.  Level 5 caveat- severe illness   Past Medical History  Diagnosis Date  . CAD (coronary artery disease)     multivessel  . Hypercholesterolemia   . Hypertension   . Osteopenia   . Hiatal hernia   . Vitamin B12 deficiency   . Iron deficiency    Past Surgical History  Procedure Laterality Date  . Coronary artery bypass graft      x 5  . Left breast biopsy    . Partial hystercetomy    . Spasmodic dysphonia     Family History  Problem Relation Age of Onset  . Stroke Father   . Heart attack Mother    History  Substance Use Topics  . Smoking status: Former Research scientist (life sciences)  . Smokeless tobacco: Not on file  . Alcohol Use: No   OB History   Grav Para Term Preterm Abortions TAB SAB Ect Mult Living                 Review of Systems  Unable to perform ROS     Allergies  Review of patient's allergies indicates no known allergies.  Home Medications   Prior to Admission medications   Medication Sig Start Date End Date Taking? Authorizing Provider  acetaminophen (TYLENOL) 500 MG tablet Take 500 mg by mouth every 6 (six) hours as needed for moderate pain (pain).    Yes Historical Provider, MD  aspirin EC 81 MG tablet Take 81 mg by mouth daily.   Yes Historical Provider, MD  BENICAR 40 MG tablet Take 40 mg by mouth daily.  09/22/12  Yes Historical Provider, MD  bisoprolol-hydrochlorothiazide (ZIAC) 5-6.25 MG per tablet Take 1 tablet by mouth daily.   Yes Historical Provider, MD  HYDROcodone-acetaminophen (NORCO/VICODIN) 5-325 MG per tablet Take 1 tablet by  mouth every 6 (six) hours as needed for moderate pain.   Yes Historical Provider, MD  nitroGLYCERIN (NITROSTAT) 0.4 MG SL tablet Place 0.4 mg under the tongue every 5 (five) minutes as needed for chest pain.   Yes Historical Provider, MD  OnabotulinumtoxinA (BOTOX IJ) Inject as directed every 3 (three) months.   Yes Historical Provider, MD  predniSONE (DELTASONE) 1 MG tablet Take 2 mg by mouth daily with breakfast.   Yes Historical Provider, MD   BP 151/69  Pulse 91  Temp(Src) 98.8 F (37.1 C) (Oral)  Resp 18  Wt 102 lb 11.8 oz (46.6 kg)  SpO2 94% Physical Exam  Nursing note and vitals reviewed. Constitutional: She appears well-developed. She appears distressed (confused).  Elderly, frail  HENT:  Head: Normocephalic and atraumatic.  Eyes: Conjunctivae and EOM are normal. Pupils are equal, round, and reactive to light.  Neck: Normal range of motion and phonation normal. Neck supple.  Cardiovascular: Normal rate.   Pulmonary/Chest: Effort normal. She exhibits no tenderness.  Musculoskeletal: Normal range of motion.  Neurological: She is alert.  Left facial droop, and decreased grip strength, left hand  Skin: Skin is warm and dry.  Psychiatric: Her behavior is normal.    ED Course  Procedures (including critical care time)  17:44- Screened at the [facility]. No respiratory distress. Being seen by Neurology at this time. Going to CT asap  She was treated with TPA, ordered by the stroke neurologist.  6:54 PM Reevaluation with update and discussion. After initial assessment and treatment, an updated evaluation reveals and she remains alert, her facial droop has resolved, and she has improved left hand grip strength. [place] Review Labs Reviewed  CBC - Abnormal; Notable for the following:    Hemoglobin 10.9 (*)    HCT 35.1 (*)    MCH 25.6 (*)    RDW 15.6 (*)    All other components within normal limits  COMPREHENSIVE METABOLIC PANEL - Abnormal; Notable for  the following:    Glucose, Bld 106 (*)    Calcium 10.6 (*)    GFR calc non Af Amer 58 (*)    GFR calc Af Amer 67 (*)    All other components within normal limits  I-STAT CHEM 8, ED - Abnormal; Notable for the following:    Glucose, Bld 110 (*)    Calcium, Ion 1.34 (*)    All other components within normal limits  ETHANOL  PROTIME-INR  APTT  DIFFERENTIAL  URINE RAPID DRUG SCREEN (HOSP PERFORMED)  URINALYSIS, ROUTINE W REFLEX MICROSCOPIC  HEMOGLOBIN A1C  LIPID PANEL  I-STAT TROPOININ, ED  I-STAT TROPOININ, ED    Imaging Review Ct Head Wo Contrast  04/26/2014   ADDENDUM REPORT: 04/26/2014 18:18  ADDENDUM: The original report was by Dr. Van Clines. The following addendum is by Dr. Van Clines:  I paged Dr. Armida Sans regarding this case at 6:03 p.m. and spoke to him regarding the findings at 6:07 p.m. by telephone.   Electronically Signed   By: Sherryl Barters M.D.   On: 04/26/2014 18:18   04/26/2014   CLINICAL DATA:  Code stroke.  Left-sided weakness.  A phase EF.  EXAM: CT HEAD WITHOUT CONTRAST  TECHNIQUE: Contiguous axial images were obtained from the base of the skull through the vertex without intravenous contrast.  COMPARISON:  03/20/2007  FINDINGS: Remote lacunar infarct, left external capsule. Periventricular white matter and corona radiata hypodensities favor chronic ischemic microvascular white matter disease.  Mildly advanced cerebral and cerebellar atrophy for age.  The brainstem, cerebral peduncle is, thalami, basal ganglia, basilar cisterns, and ventricular system appear unremarkable. No intracranial hemorrhage, mass lesion, or acute CVA.  There is atherosclerotic calcification of the cavernous carotid arteries bilaterally.  IMPRESSION: 1. No acute intracranial findings. 2. Chronic microvascular white matter disease, remote left external capsule lacunar infarct, and cerebral and cerebellar atrophy.  Electronically Signed: By: Sherryl Barters M.D. On: 04/26/2014 18:03    Dg Chest Port 1 View  04/26/2014   CLINICAL DATA:  Stroke.  EXAM: PORTABLE CHEST - 1 VIEW  COMPARISON:  None.  FINDINGS: The heart size and mediastinal contours are within normal limits. Status post coronary artery bypass graft. No pneumothorax or pleural effusion is noted. Both lungs are clear. The visualized skeletal structures are unremarkable.  IMPRESSION: No acute cardiopulmonary abnormality seen.   Electronically Signed   By: Sabino Dick M.D.   On: 04/26/2014 18:44     EKG Interpretation   Date/Time:  Friday April 26 2014 18:05:50 EDT Ventricular Rate:  93 PR Interval:    QRS Duration: 82 QT Interval:  356 QTC Calculation: 443 R Axis:   84 Text Interpretation:  Atrial fibrillation Borderline right axis deviation  Borderline repolarization abnormality new onset Atrial Fibrillation, since  last tracing Abnormal ECG Confirmed by [facility]  MD, Tashari Schoenfelder [phone]) on  04/26/2014 6:56:42 PM      MDM   Final diagnoses:  Cerebral infarction due to embolism of cerebral artery  Atrial fibrillation, new onset    Likely embolic CVA secondary to atrial fibrillation. She admitted for further treatment and stabilization.  Nursing Notes Reviewed/ Care Coordinated, and agree without changes. Applicable Imaging Reviewed.  Interpretation of Laboratory Data incorporated into ED treatment   Plan: Admit, to the stroke team    Richarda Blade, MD 04/26/14 1900

## 2014-04-26 NOTE — Consult Note (Signed)
Referring Physician: ED    Chief Complaint: CODE STROKE: LEFT HEMIPARESIS, LEFT FACE WEAKNESS, DYSARTHRIA.  HPI:                                                                                                                                         [name] [name] is an 80 y.o. female with a past medical history significant for HTN, hyperlipidemia, CAD s/p CABG, B12 deficiency, brought in by EMS as a code stroke due to acute onset of the above stated symptoms. Patient was at her daughter's house when suddenly she was noted to be confused, " looking a feeling weird", with slurred speech and weakness of the left side. EMS was summoned and they stated that she was completely weak in he left side and had mild dysarthria that subsequently improved. Initial NIHSS 7. CT brain showed no acute abnormality. Currently, she is alert, following commands appropriately, and denies HA, vertigo, double vision, difficulty swallowing, numbness, language or vision disturbances.No chest pain, SOB, or palpitations.    Date last known well: 04/26/14 Time last known well: 448 pm tPA Given: yes NIHSS: 7   Past Medical History  Diagnosis Date  . CAD (coronary artery disease)     multivessel  . Hypercholesterolemia   . Hypertension   . Osteopenia   . Hiatal hernia   . Vitamin B12 deficiency   . Iron deficiency     Past Surgical History  Procedure Laterality Date  . Coronary artery bypass graft      x 5  . Left breast biopsy    . Partial hystercetomy    . Spasmodic dysphonia      Family History  Problem Relation Age of Onset  . Stroke Father   . Heart attack Mother    Social History:  reports that she has quit smoking. She does not have any smokeless tobacco history on file. She reports that she does not drink alcohol or use illicit drugs.  Allergies: No Known Allergies  Medications:                                                                                                                            I have reviewed the patient's current medications.  ROS:  History obtained from chart review and daughter.  General ROS: negative for - chills, fatigue, fever, night sweats, weight gain or weight loss Psychological ROS: negative for - behavioral disorder, hallucinations, memory difficulties, mood swings or suicidal ideation Ophthalmic ROS: negative for - blurry vision, double vision, eye pain or loss of vision ENT ROS: negative for - epistaxis, nasal discharge, oral lesions, sore throat, tinnitus or vertigo Allergy and Immunology ROS: negative for - hives or itchy/watery eyes Hematological and Lymphatic ROS: negative for - bleeding problems, bruising or swollen lymph nodes Endocrine ROS: negative for - galactorrhea, hair pattern changes, polydipsia/polyuria or temperature intolerance Respiratory ROS: negative for - cough, hemoptysis, shortness of breath or wheezing Cardiovascular ROS: negative for - chest pain, dyspnea on exertion, edema or irregular heartbeat Gastrointestinal ROS: negative for - abdominal pain, diarrhea, hematemesis, nausea/vomiting or stool incontinence Genito-Urinary ROS: negative for - dysuria, hematuria, incontinence or urinary frequency/urgency Musculoskeletal ROS: negative for - joint swelling Neurological ROS: as noted in HPI Dermatological ROS: negative for rash and skin lesion changes  Physical exam: pleasant female in no apparent distress. BP 152/70, P 76, R 17, afebrile Head: normocephalic. Neck: supple, no bruits, no JVD. Cardiac: no murmurs. Lungs: clear. Abdomen: soft, no tender, no mass. Extremities: no edema. CV: pulses palpable throughout  Neurologic Examination:                                                                                                      General: Mental Status: Alert, oriented, thought  content appropriate.  Speech fluent without evidence of aphasia.  Able to follow 3 step commands without difficulty. Cranial Nerves: II: Discs flat bilaterally; Visual fields grossly normal, pupils equal, round, reactive to light and accommodation III,IV, VI: ptosis not present, extra-ocular motions intact bilaterally V,VII: smile asymmetric due to mild left face weakness, facial light touch sensation normal bilaterally VIII: hearing normal bilaterally IX,X: gag reflex present XI: bilateral shoulder shrug XII: midline tongue extension without atrophy or fasciculations Motor: Left hemiparesis, arm greater than leg Tone and bulk:normal tone throughout; no atrophy noted Sensory: Pinprick and light touch diminished left arm. Left sided neglect. Deep Tendon Reflexes:  2+ all over Plantars: Right: downgoing   Left: downgoing Cerebellar: normal finger-to-nose,  normal heel-to-shin test Gait: no tested    Results for orders placed during the hospital encounter of 04/26/14 (from the past 48 hour(s))  CBC     Status: Abnormal   Collection Time    04/26/14  5:46 PM      Result Value Ref Range   WBC 6.8  4.0 - 10.5 K/uL   RBC 4.26  3.87 - 5.11 MIL/uL   Hemoglobin 10.9 (*) 12.0 - 15.0 g/dL   HCT 35.1 (*) 36.0 - 46.0 %   MCV 82.4  78.0 - 100.0 fL   MCH 25.6 (*) 26.0 - 34.0 pg   MCHC 31.1  30.0 - 36.0 g/dL   RDW 15.6 (*) 11.5 - 15.5 %   Platelets 254  150 - 400 K/uL  DIFFERENTIAL     Status: None   Collection Time  04/26/14  5:46 PM      Result Value Ref Range   Neutrophils Relative % 60  43 - 77 %   Neutro Abs 4.0  1.7 - 7.7 K/uL   Lymphocytes Relative 26  12 - 46 %   Lymphs Abs 1.7  0.7 - 4.0 K/uL   Monocytes Relative 12  3 - 12 %   Monocytes Absolute 0.8  0.1 - 1.0 K/uL   Eosinophils Relative 2  0 - 5 %   Eosinophils Absolute 0.1  0.0 - 0.7 K/uL   Basophils Relative 0  0 - 1 %   Basophils Absolute 0.0  0.0 - 0.1 K/uL  I-STAT TROPOININ, ED     Status: None   Collection Time     04/26/14  5:55 PM      Result Value Ref Range   Troponin i, poc 0.02  0.00 - 0.08 ng/mL   Comment 3            Comment: Due to the release kinetics of cTnI,     a negative result within the first hours     of the onset of symptoms does not rule out     myocardial infarction with certainty.     If myocardial infarction is still suspected,     repeat the test at appropriate intervals.  I-STAT CHEM 8, ED     Status: Abnormal   Collection Time    04/26/14  5:57 PM      Result Value Ref Range   Sodium 140  137 - 147 mEq/L   Potassium 3.9  3.7 - 5.3 mEq/L   Chloride 105  96 - 112 mEq/L   BUN 16  6 - 23 mg/dL   Creatinine, Ser 1.00  0.50 - 1.10 mg/dL   Glucose, Bld 110 (*) 70 - 99 mg/dL   Calcium, Ion 1.34 (*) 1.13 - 1.30 mmol/L   TCO2 24  0 - 100 mmol/L   Hemoglobin 12.6  12.0 - 15.0 g/dL   HCT 37.0  36.0 - 46.0 %   Ct Head Wo Contrast  04/26/2014   CLINICAL DATA:  Code stroke.  Left-sided weakness.  A phase EF.  EXAM: CT HEAD WITHOUT CONTRAST  TECHNIQUE: Contiguous axial images were obtained from the base of the skull through the vertex without intravenous contrast.  COMPARISON:  03/20/2007  FINDINGS: Remote lacunar infarct, left external capsule. Periventricular white matter and corona radiata hypodensities favor chronic ischemic microvascular white matter disease.  Mildly advanced cerebral and cerebellar atrophy for age.  The brainstem, cerebral peduncle is, thalami, basal ganglia, basilar cisterns, and ventricular system appear unremarkable. No intracranial hemorrhage, mass lesion, or acute CVA.  There is atherosclerotic calcification of the cavernous carotid arteries bilaterally.  IMPRESSION: 1. No acute intracranial findings. 2. Chronic microvascular white matter disease, remote left external capsule lacunar infarct, and cerebral and cerebellar atrophy.   Electronically Signed   By: Sherryl Barters M.D.   On: 04/26/2014 18:03     Assessment: 80 y.o. female with a neurological  syndrome consistent with a right brain ischemic stroke. NIHSS 7, CT brain without acute abnormality. Patient meets criteria for IV thrombolysis and will treat accordingly. Given NIHSS 7, will obtain CTA brain searching for clot/thrombus proximal right MCA. Admit to NICU and follow post IV tpa protocol. Stroke team will follow up in am.  Stroke Risk Factors - age, HTN, hyperlipidemia, CAD  Plan: 1. HgbA1c, fasting lipid panel 2. MRI, MRA  of the  brain without contrast 3. Echocardiogram 4. Carotid dopplers 5. Prophylactic therapy-aspirin as per post IV thrombolysis protocol. 6. Risk factor modification 7. Telemetry monitoring 8. Frequent neuro checks 9. PT/OT SLP   Dorian Pod, MD [facility] [phone]  04/26/2014, 6:16 PM

## 2014-04-26 NOTE — ED Notes (Signed)
`  Pt states started having nausea feeling in tummy again.  Notified Dr. Doy Mince and she said to get patient to CT for her angio

## 2014-04-26 NOTE — ED Notes (Signed)
In CT for Angio

## 2014-04-26 NOTE — ED Notes (Signed)
Per EMS:  Daughter states at [phone] pt was not walking and not acting right, pt had left sided weakness with drift, left facial droop. EMS  Reports afib on monitor 90-120 and no history of afib.  BP 170/100, CBG  126.  Daughter reported a couple of months ago she had a speech problem and it resolved but was never seen for it.

## 2014-04-27 ENCOUNTER — Inpatient Hospital Stay (HOSPITAL_COMMUNITY): Payer: Medicare Other

## 2014-04-27 LAB — LIPID PANEL
Cholesterol: 136 mg/dL (ref 0–200)
HDL: 72 mg/dL (ref >39)
LDL CALC: 51 mg/dL (ref 0–99)
Total CHOL/HDL Ratio: 1.9 RATIO
Triglycerides: 63 mg/dL (ref <150)
VLDL: 13 mg/dL (ref 0–40)

## 2014-04-27 LAB — HEMOGLOBIN A1C
HEMOGLOBIN A1C: 6.4 % — AB (ref <5.7)
MEAN PLASMA GLUCOSE: 137 mg/dL — AB (ref <117)

## 2014-04-27 MED ORDER — BIOTENE DRY MOUTH MT LIQD
15.0000 mL | Freq: Two times a day (BID) | OROMUCOSAL | Status: DC
  Administered 2014-04-27: 15 mL via OROMUCOSAL

## 2014-04-27 MED ORDER — PNEUMOCOCCAL 13-VAL CONJ VACC IM SUSP
0.5000 mL | INTRAMUSCULAR | Status: DC
  Filled 2014-04-27: qty 0.5

## 2014-04-27 MED ORDER — ASPIRIN EC 325 MG PO TBEC
325.0000 mg | DELAYED_RELEASE_TABLET | Freq: Every day | ORAL | Status: DC
  Administered 2014-04-27 – 2014-04-29 (×3): 325 mg via ORAL
  Filled 2014-04-27 (×4): qty 1

## 2014-04-27 MED ORDER — MAGNESIUM HYDROXIDE 400 MG/5ML PO SUSP
5.0000 mL | Freq: Once | ORAL | Status: AC
  Administered 2014-04-27: 5 mL via ORAL
  Filled 2014-04-27: qty 30

## 2014-04-27 NOTE — Progress Notes (Addendum)
Stroke Team Progress Note  HISTORY [name] [name] is an 80 y.o. female with a past medical history significant for HTN, hyperlipidemia, CAD s/p CABG, B12 deficiency, brought in by EMS as a code stroke due to acute onset of the above stated symptoms.  Patient was at her daughter's house when suddenly she was noted to be confused, " looking a feeling weird", with slurred speech and weakness of the left side. EMS was summoned and they stated that she was completely weak in he left side and had mild dysarthria that subsequently improved.  Initial NIHSS 7.  CT brain showed no acute abnormality.  Currently, she is alert, following commands appropriately, and denies HA, vertigo, double vision, difficulty swallowing, numbness, language or vision disturbances.No chest pain, SOB, or palpitations.   Patient was administerd TPA secondary to weakness and dysarthria. She was admitted to the neuro ICU for further evaluation and treatment.  SUBJECTIVE No overnight events.   OBJECTIVE Most recent Vital Signs: Filed Vitals:   04/27/14 0500 04/27/14 0600 04/27/14 0700 04/27/14 0733  BP: 139/62 143/61 140/61   Pulse: 62 89 69   Temp:    99 F (37.2 C)  TempSrc:    Oral  Resp: 16 15 18    Height:      Weight:      SpO2: 97% 99% 99%    CBG (last 3)  No results found for this basename: GLUCAP,  in the last 72 hours  IV Fluid Intake:   . sodium chloride 75 mL/hr at 04/27/14 0654    MEDICATIONS  . antiseptic oral rinse  15 mL Mouth Rinse BID  . pantoprazole (PROTONIX) IV  40 mg Intravenous QHS  . [START ON 04/28/2014] pneumococcal 13-valent conjugate vaccine  0.5 mL Intramuscular Tomorrow-1000   PRN:  acetaminophen, acetaminophen, labetalol, senna-docusate  Diet:  NPO  Activity:   bedrest DVT Prophylaxis:  SCD  CLINICALLY SIGNIFICANT STUDIES Basic Metabolic Panel:  Recent Labs Lab 04/26/14 1746 04/26/14 1757  NA 140 140  K 4.1 3.9  CL 100 105  CO2 25  --   GLUCOSE 106* 110*  BUN 17 16   CREATININE 0.92 1.00  CALCIUM 10.6*  --    Liver Function Tests:  Recent Labs Lab 04/26/14 1746  AST 31  ALT 20  ALKPHOS 46  BILITOT 0.3  PROT 6.9  ALBUMIN 3.8   CBC:  Recent Labs Lab 04/26/14 1746 04/26/14 1757  WBC 6.8  --   NEUTROABS 4.0  --   HGB 10.9* 12.6  HCT 35.1* 37.0  MCV 82.4  --   PLT 254  --    Coagulation:  Recent Labs Lab 04/26/14 1746  LABPROT 13.0  INR 0.98   Cardiac Enzymes: No results found for this basename: CKTOTAL, CKMB, CKMBINDEX, TROPONINI,  in the last 168 hours Urinalysis: No results found for this basename: COLORURINE, APPERANCEUR, LABSPEC, PHURINE, GLUCOSEU, HGBUR, BILIRUBINUR, KETONESUR, PROTEINUR, UROBILINOGEN, NITRITE, LEUKOCYTESUR,  in the last 168 hours Lipid Panel    Component Value Date/Time   CHOL 136 04/27/2014 0225   TRIG 63 04/27/2014 0225   HDL 72 04/27/2014 0225   CHOLHDL 1.9 04/27/2014 0225   VLDL 13 04/27/2014 0225   LDLCALC 51 04/27/2014 0225   HgbA1C  No results found for this basename: HGBA1C    Urine Drug Screen:   No results found for this basename: labopia, cocainscrnur, labbenz, amphetmu, thcu, labbarb    Alcohol Level:  Recent Labs Lab 04/26/14 1746  ETH <11    Ct  Angio Head W/cm &/or Wo Cm  04/26/2014   CLINICAL DATA:  Code stroke.  CT a. left-sided weakness.  EXAM: CT ANGIOGRAPHY HEAD  TECHNIQUE: Multidetector CT imaging of the head was performed using the standard protocol during bolus administration of intravenous contrast. Multiplanar CT image reconstructions and MIPs were obtained to evaluate the vascular anatomy.  CONTRAST:  96mL OMNIPAQUE IOHEXOL 350 MG/ML SOLN  COMPARISON:  Head CT ear earlier same day  FINDINGS: Arterial opacification is excellent. Both internal carotid arteries are patent through the skullbase. There is peripheral calcification in the carotid siphon regions but no flow-limiting stenosis. There is 50% stenosis of the supra clinoid internal carotid artery on the right. Anterior and  middle cerebral vessels appear patent without stenosis or occlusion. No aneurysm or vascular malformation.  Both vertebral arteries are widely patent through the foramen magnum. There is some atherosclerotic calcification but no stenosis. The basilar artery is of normal caliber and is widely patent. Posterior circulation branch vessels appear patent.  Review of the MIP images confirms the above findings.  IMPRESSION: No definable vessel occlusion.  No flow limiting stenosis.  Atherosclerotic calcification in the carotid siphon regions. 50% stenosis of the supra clinoid ICA on the right.   Electronically Signed   By: Nelson Chimes M.D.   On: 04/26/2014 19:56   Ct Head Wo Contrast  04/26/2014   ADDENDUM REPORT: 04/26/2014 18:18  ADDENDUM: The original report was by Dr. Van Clines. The following addendum is by Dr. Van Clines:  I paged Dr. Armida Sans regarding this case at 6:03 p.m. and spoke to him regarding the findings at 6:07 p.m. by telephone.   Electronically Signed   By: Sherryl Barters M.D.   On: 04/26/2014 18:18   04/26/2014   CLINICAL DATA:  Code stroke.  Left-sided weakness.  A phase EF.  EXAM: CT HEAD WITHOUT CONTRAST  TECHNIQUE: Contiguous axial images were obtained from the base of the skull through the vertex without intravenous contrast.  COMPARISON:  03/20/2007  FINDINGS: Remote lacunar infarct, left external capsule. Periventricular white matter and corona radiata hypodensities favor chronic ischemic microvascular white matter disease.  Mildly advanced cerebral and cerebellar atrophy for age.  The brainstem, cerebral peduncle is, thalami, basal ganglia, basilar cisterns, and ventricular system appear unremarkable. No intracranial hemorrhage, mass lesion, or acute CVA.  There is atherosclerotic calcification of the cavernous carotid arteries bilaterally.  IMPRESSION: 1. No acute intracranial findings. 2. Chronic microvascular white matter disease, remote left external capsule lacunar  infarct, and cerebral and cerebellar atrophy.  Electronically Signed: By: Sherryl Barters M.D. On: 04/26/2014 18:03   Dg Chest Port 1 View  04/26/2014   CLINICAL DATA:  Stroke.  EXAM: PORTABLE CHEST - 1 VIEW  COMPARISON:  None.  FINDINGS: The heart size and mediastinal contours are within normal limits. Status post coronary artery bypass graft. No pneumothorax or pleural effusion is noted. Both lungs are clear. The visualized skeletal structures are unremarkable.  IMPRESSION: No acute cardiopulmonary abnormality seen.   Electronically Signed   By: Sabino Dick M.D.   On: 04/26/2014 18:44    CT of the brain    MRI of the brain    MRA of the brain    2D Echocardiogram    Carotid Doppler    CXR    Therapy Recommendations pending  Physical Exam   Cardiac: no murmurs.  Lungs: clear.  Abdomen: soft, no tender, no mass.  Extremities: no edema.  CV: pulses palpable throughout  Neurologic Examination:  General:  Mental Status:  Alert, oriented, thought content appropriate. Speech fluent without evidence of aphasia. Able to follow 3 step commands without difficulty. Mild dysphonia (chronic problem) Cranial Nerves:  II: Visual fields grossly normal, pupils equal, round, reactive to light and accommodation  III,IV, VI: ptosis not present, extra-ocular motions intact bilaterally  V,VII: smile asymmetric due to mild left face weakness, facial light touch sensation normal bilaterally  VIII: hearing normal bilaterally  XII: midline tongue extension without atrophy or fasciculations  Motor:  Minimal Left hemiparesis, arm greater than leg  Tone and bulk:normal tone throughout; no atrophy noted  Sensory: Sensation intact to LT throughout. No left sided neglect.  Deep Tendon Reflexes:  2+ all over  Plantars:  Right: downgoing Left: downgoing  Cerebellar:  normal finger-to-nose, normal heel-to-shin test  Gait: not tested  ASSESSMENT [name] is a 80 y.o. female presenting with  left sided weakness. Status post IV t-PA on 7/17 at 1822. Confirmatory imaging pending but suspect a R MCA infarct. Infarct felt to be embolic secondary to unknown etiology.  On ASA 81mg  prior to admission. Now all anticoagulation held secondary to recent tPA administration. Patient with resultant minimal left sided weakness. Stroke work up underway.   CAD  LDL   HTN  Spasmodic dsyphonia   Hospital day # 1  TREATMENT/PLAN  MRI/A brain at 24hrs (or head CT if MRI not available).   If repeat imaging stable then start ASA 325mg   2D echo  Carotid doppler  Rehab evaluation  Telemetry  Will hold statin for LDL of 51  This patient is critically ill and at significant risk of neurological worsening, death and care requires constant monitoring of vital signs, hemodynamics,respiratory and cardiac monitoring,review of multiple databases, neurological assessment, discussion with family, other specialists and medical decision making of high complexity. I spent 35 inutes of neurocritical care time in the care of this patient.    Jim Like, DO [facility] Pager: [phone]   To contact Stroke Continuity provider, please refer to [email or web address] After hours, contact General Neurology

## 2014-04-27 NOTE — Evaluation (Signed)
Physical Therapy Evaluation Patient Details Name: [name] [name] MRN: [id number] DOB: 1931-11-14 Today's Date: 04/27/2014   History of Present Illness  [name] [name] is a 80 y.o. female presenting with left sided weakness. Status post IV t-PA on 7/17 at 1822. Confirmatory imaging pending but suspect a R MCA infarct. Infarct felt to be embolic secondary to unknown etiology, per neruo note.   Clinical Impression  Pt adm from home secondary to above. Pt presents with limitations in functional mobility secondary to deficits indicated below. Pt to benefit from skilled acute PT to address deficits and maximize functional mobility prior to D/C home with husband.     Follow Up Recommendations Supervision - Intermittent;No PT follow up    Equipment Recommendations  None recommended by PT    Recommendations for Other Services       Precautions / Restrictions Precautions Precautions: None      Mobility  Bed Mobility Overal bed mobility: Modified Independent             General bed mobility comments: able to transfer to EOB  Transfers Overall transfer level: Needs assistance Equipment used: None Transfers: Sit to/from Stand Sit to Stand: Min guard         General transfer comment: min guard to steady initially; slight sway with standing  Ambulation/Gait Ambulation/Gait assistance: Min guard Ambulation Distance (Feet): 120 Feet Assistive device: None Gait Pattern/deviations: Step-through pattern;Decreased stride length;Drifts right/left;Narrow base of support Gait velocity: decreased Gait velocity interpretation: Below normal speed for age/gender General Gait Details: pt with unsteady with gt; min guard to steady and for safety; pt with staggered steps at times when distracted in hallway; cues to widen BOS for balance  Stairs            Wheelchair Mobility    Modified Rankin (Stroke Patients Only) Modified Rankin (Stroke Patients Only) Pre-Morbid Rankin  Score: No symptoms Modified Rankin: Moderately severe disability     Balance Overall balance assessment: Needs assistance Sitting-balance support: Feet supported;No upper extremity supported Sitting balance-Leahy Scale: Good Sitting balance - Comments: no c/o dizziness    Standing balance support: During functional activity;No upper extremity supported Standing balance-Leahy Scale: Fair Standing balance comment: slight sway             High level balance activites: Head turns;Direction changes;Turns               Pertinent Vitals/Pain VSS; no c/o pain    Home Living Family/patient expects to be discharged to:: Private residence Living Arrangements: Spouse/significant other Available Help at Discharge: Family;Available 24 hours/day Type of Home: House Home Access: Stairs to enter Entrance Stairs-Rails: None Entrance Stairs-Number of Steps: 4 Home Layout: Two level;Bed/bath upstairs Home Equipment: None      Prior Function Level of Independence: Independent               Hand Dominance   Dominant Hand: Right    Extremity/Trunk Assessment   Upper Extremity Assessment: Defer to OT evaluation           Lower Extremity Assessment: LLE deficits/detail   LLE Deficits / Details: knee 4/5; hip 3+/5  Cervical / Trunk Assessment: Normal  Communication   Communication: No difficulties  Cognition Arousal/Alertness: Awake/alert Behavior During Therapy: WFL for tasks assessed/performed Overall Cognitive Status: Within Functional Limits for tasks assessed                      General Comments General comments (skin integrity, edema, etc.): denies  any changes in vision     Exercises        Assessment/Plan    PT Assessment Patient needs continued PT services  PT Diagnosis Abnormality of gait;Generalized weakness   PT Problem List Decreased strength;Decreased activity tolerance;Decreased balance;Decreased mobility  PT Treatment  Interventions DME instruction;Gait training;Stair training;Functional mobility training;Therapeutic activities;Therapeutic exercise;Balance training;Neuromuscular re-education;Patient/family education   PT Goals (Current goals can be found in the Care Plan section) Acute Rehab PT Goals Patient Stated Goal: to go home with my husband PT Goal Formulation: With patient Time For Goal Achievement: 04/30/14 Potential to Achieve Goals: Good    Frequency Min 4X/week   Barriers to discharge        Co-evaluation               End of Session Equipment Utilized During Treatment: Gait belt Activity Tolerance: Patient tolerated treatment well Patient left: in chair;with call bell/phone within reach;with family/visitor present Nurse Communication: Mobility status         Time: 1022-1038 PT Time Calculation (min): 16 min   Charges:   PT Evaluation $Initial PT Evaluation Tier I: 1 Procedure PT Treatments $Gait Training: 8-22 mins   PT G CodesGustavus Bryant, [place]  [phone] 04/27/2014, 12:41 PM

## 2014-04-27 NOTE — Evaluation (Signed)
Speech Language Pathology Evaluation Patient Details Name: [name] [name] MRN: [id number] DOB: [birth year] Today's Date: 04/27/2014 Time: 6812-7517 SLP Time Calculation (min): 15 min  Problem List:  Patient Active Problem List   Diagnosis Date Noted  . Stroke with cerebral ischemia 04/26/2014  . Coronary atherosclerosis of native coronary artery 11/14/2012  . Other and unspecified hyperlipidemia 11/14/2012  . UNSPECIFIED ESSENTIAL HYPERTENSION 11/06/2010   Past Medical History:  Past Medical History  Diagnosis Date  . CAD (coronary artery disease)     multivessel  . Hypercholesterolemia   . Hypertension   . Osteopenia   . Hiatal hernia   . Vitamin B12 deficiency   . Iron deficiency    Past Surgical History:  Past Surgical History  Procedure Laterality Date  . Coronary artery bypass graft      x 5  . Left breast biopsy    . Partial hystercetomy    . Spasmodic dysphonia     HPI:  [name] [name] is an 80 y.o. female with a past medical history significant for HTN, hyperlipidemia, CAD s/p CABG, B12 deficiency, brought in by EMS as a code stroke on 04/26/14.   CT: No acute intracranial findings.   Assessment / Plan / Recommendation Clinical Impression  Cognitive Linguistic Evaluation completed per Stroke Protocol.  Receptive and expressive language skills baseline with no aphasia.  Cognitive deficits in areas of problem solving, focused and sustained attention, safety awareness, and executive functions.  ST to follow in acute care setting to address deficits.  Recommend 24 hour supervision at next level of care.     SLP Assessment  Patient needs continued Speech Lanaguage Pathology Services    Follow Up Recommendations  Outpatient SLP    Frequency and Duration min 2x/week  2 weeks      SLP Goals  SLP Goals Potential to Achieve Goals: Good Potential Considerations: Family/community support  SLP Evaluation Prior Functioning  Cognitive/Linguistic Baseline: Information  not available Type of Home: House  Lives With: Alone Available Help at Discharge: Family;Available 24 hours/day   Cognition  Overall Cognitive Status: No family/caregiver present to determine baseline cognitive functioning Arousal/Alertness: Awake/alert Orientation Level: Oriented to person;Oriented to place Attention: Sustained Sustained Attention: Impaired Sustained Attention Impairment: Verbal basic;Verbal complex;Functional basic Memory: Impaired Memory Impairment: Retrieval deficit;Decreased recall of new information;Decreased short term memory Decreased Short Term Memory: Verbal complex;Functional complex;Functional basic Awareness: Impaired Awareness Impairment: Intellectual impairment;Emergent impairment;Anticipatory impairment Problem Solving: Impaired Problem Solving Impairment: Verbal basic;Functional basic Executive Function: Sequencing;Self Monitoring Sequencing: Impaired Sequencing Impairment: Verbal basic;Functional basic Self Monitoring: Impaired Self Monitoring Impairment: Verbal basic;Functional basic Safety/Judgment: Impaired    Comprehension  Auditory Comprehension Overall Auditory Comprehension: Appears within functional limits for tasks assessed Visual Recognition/Discrimination Discrimination: Within Function Limits Reading Comprehension Reading Status: Within funtional limits    Expression Expression Primary Mode of Expression: Verbal Verbal Expression Overall Verbal Expression: Appears within functional limits for tasks assessed Written Expression Dominant Hand: Right Written Expression: Not tested   Oral / Motor Oral Motor/Sensory Function Overall Oral Motor/Sensory Function: Appears within functional limits for tasks assessed Motor Speech Overall Motor Speech: Appears within functional limits for tasks assessed   [place], [place] [facility] 04/27/2014, 12:21 PM

## 2014-04-27 NOTE — Evaluation (Signed)
Clinical/Bedside Swallow Evaluation Patient Details  Name: [name] [name] MRN: [id number] Date of Birth: Jul 03, 1932  Today's Date: 04/27/2014 Time: 1100-1130 SLP Time Calculation (min): 30 min  Past Medical History:  Past Medical History  Diagnosis Date  . CAD (coronary artery disease)     multivessel  . Hypercholesterolemia   . Hypertension   . Osteopenia   . Hiatal hernia   . Vitamin B12 deficiency   . Iron deficiency    Past Surgical History:  Past Surgical History  Procedure Laterality Date  . Coronary artery bypass graft      x 5  . Left breast biopsy    . Partial hystercetomy    . Spasmodic dysphonia     HPI:  [name] [name] is an 80 y.o. female with a past medical history significant for HTN, hyperlipidemia, CAD s/p CABG, B12 deficiency, brought in by EMS as a code stroke on 04/26/14.   CT: No acute intracranial findings. 2. Chronic microvascular white matter disease, remote left external capsule lacunar infarct, and cerebral and cerebellar atrophy.  Received tPA in ED.   PMH significant for spasmodic dysphonia.  Patient denies prior reports of dysphagia.        Assessment / Plan / Recommendation Clinical Impression  BSE completed per Stroke Protocol.  No outward clinical s/s of aspiration noted throughout evaluation.  Oropharyngeal swallow functional to initiate regular consistency diet and thin liquids.  No outward clinical s/s of aspiration noted but continued ST indicated in acute setting with PMH for vocal cord involvement and current episode increasing risk for silent aspiration.  ST to follow for diet tolerance to ensure safety.      Aspiration Risk  Moderate    Diet Recommendation Regular;Thin liquid   Liquid Administration via: Cup;Straw Medication Administration: Whole meds with liquid Supervision: Intermittent supervision to cue for compensatory strategies Compensations: Slow rate;Small sips/bites Postural Changes and/or Swallow Maneuvers: Out of bed  for meals;Seated upright 90 degrees;Upright 30-60 min after meal    Other  Recommendations Oral Care Recommendations: Oral care BID Other Recommendations: Clarify dietary restrictions   Follow Up Recommendations   (TBD )    Frequency and Duration min 2x/week  2 weeks       SLP Swallow Goals Please refer to Care Plans for Listed Goals.    Swallow Study Prior Functional Status   Lives at home independent.  Denies prior history of dysphagia     General Date of Onset: 04/26/14 HPI: [name] [name] is an 80 y.o. female with a past medical history significant for HTN, hyperlipidemia, CAD s/p CABG, B12 deficiency, brought in by EMS as a code stroke due to acute onset of the above stated symptoms. Type of Study: Bedside swallow evaluation Diet Prior to this Study: NPO Temperature Spikes Noted: No Respiratory Status: Room air History of Recent Intubation: No Behavior/Cognition: Alert;Cooperative;Pleasant mood;Decreased sustained attention Oral Cavity - Dentition: Adequate natural dentition Self-Feeding Abilities: Able to feed self Patient Positioning: Upright in chair Baseline Vocal Quality: Hoarse Volitional Cough: Strong Volitional Swallow: Able to elicit    Oral/Motor/Sensory Function Overall Oral Motor/Sensory Function: Appears within functional limits for tasks assessed   Ice Chips Ice chips: Not tested   Thin Liquid Thin Liquid: Within functional limits Presentation: Cup    Nectar Thick Nectar Thick Liquid: Not tested   Honey Thick Honey Thick Liquid: Not tested   Puree Puree: Within functional limits Presentation: Self Fed;Spoon   Solid   GO    Solid: Within functional limits  Sharman Crate [place], [place] [facility] 04/27/2014,12:04 PM

## 2014-04-27 NOTE — Plan of Care (Signed)
Problem: tPA Day Progression Outcomes-Only if tPA administered Goal: Post tPA no anticoagulants/antiplatelets 24 hrs Outcome: Completed/Met Date Met:  04/27/14 7/18 - pt has not received any anticoagulation/antiplatlets for the 24 hour period Goal: Post tPA bedrest for 24 hours Outcome: Completed/Met Date Met:  04/27/14 7/18 - stroke physician approved out of bed to work with physical therapy

## 2014-04-27 NOTE — Progress Notes (Signed)
*  PRELIMINARY RESULTS* Vascular Ultrasound Carotid Duplex (Doppler) has been completed.   Findings suggest 1-39% internal carotid artery stenosis bilaterally. Vertebral arteries are patent with antegrade flow.  04/27/2014 1:29 PM Maudry Mayhew, RVT, RDCS, RDMS

## 2014-04-28 DIAGNOSIS — I369 Nonrheumatic tricuspid valve disorder, unspecified: Secondary | ICD-10-CM

## 2014-04-28 MED ORDER — PNEUMOCOCCAL VAC POLYVALENT 25 MCG/0.5ML IJ INJ
0.5000 mL | INJECTION | Freq: Once | INTRAMUSCULAR | Status: DC
  Filled 2014-04-28: qty 0.5

## 2014-04-28 MED ORDER — PANTOPRAZOLE SODIUM 40 MG PO TBEC
40.0000 mg | DELAYED_RELEASE_TABLET | Freq: Every day | ORAL | Status: DC
  Administered 2014-04-28 – 2014-04-29 (×2): 40 mg via ORAL
  Filled 2014-04-28 (×2): qty 1

## 2014-04-28 NOTE — Progress Notes (Signed)
Physical Therapy Treatment and Discharge Patient Details Name: [name] [name] MRN: [id number] DOB: 1931/12/04 Today's Date: 04/28/2014    History of Present Illness [name] [name] is a 80 y.o. female presenting with left sided weakness. Status post IV t-PA on 7/17 at 1822. Confirmatory imaging pending but suspect a R MCA infarct. Infarct felt to be embolic secondary to unknown etiology.      PT Comments    Pt is now independent with all mobility and balance activities including stairs; OK for dc home from PT standpoint.   No further acute PT needs identified; Will sign off   Follow Up Recommendations  Supervision - Intermittent;No PT follow up     Equipment Recommendations  None recommended by PT    Recommendations for Other Services       Precautions / Restrictions Precautions Precautions: None    Mobility  Bed Mobility Overal bed mobility: Modified Independent                Transfers Overall transfer level: Modified independent Equipment used: None Transfers: Sit to/from Stand Sit to Stand: Modified independent (Device/Increase time)         General transfer comment: No noted incr sway  Ambulation/Gait Ambulation/Gait assistance: Independent Ambulation Distance (Feet): 125 Feet Assistive device: None Gait Pattern/deviations: WFL(Within Functional Limits)     General Gait Details: No unsteadiness noted   Stairs Stairs: Yes Stairs assistance: Modified independent (Device/Increase time) Stair Management: Two rails;Alternating pattern;Step to pattern;Forwards (ascended alternating; descended step-to) Number of Stairs: 12 General stair comments: Managing well  Wheelchair Mobility    Modified Rankin (Stroke Patients Only) Modified Rankin (Stroke Patients Only) Pre-Morbid Rankin Score: No symptoms Modified Rankin: No significant disability     Balance     Sitting balance-Leahy Scale: Good       Standing balance-Leahy Scale: Good                      Cognition Arousal/Alertness: Awake/alert Behavior During Therapy: WFL for tasks assessed/performed Overall Cognitive Status: Within Functional Limits for tasks assessed                      Exercises      General Comments General comments (skin integrity, edema, etc.): Discussed signs and symptoms of stroke      Pertinent Vitals/Pain no apparent distress     Home Living                      Prior Function            PT Goals (current goals can now be found in the care plan section) Acute Rehab PT Goals Patient Stated Goal: to go home with my husband Progress towards PT goals: Goals met/education completed, patient discharged from PT    Frequency  Other (Comment) (DC acute PT)    PT Plan Current plan remains appropriate    Co-evaluation             End of Session   Activity Tolerance: Patient tolerated treatment well Patient left: in bed;with call bell/phone within reach (sitting EOB)     Time: 6644-0347 PT Time Calculation (min): 11 min  Charges:  $Gait Training: 8-22 mins                    G Codes:      Roney Marion Hamff 04/28/2014, 1:10 PM  Roney Marion, [place] Pager [phone]  Office [phone]

## 2014-04-28 NOTE — Progress Notes (Signed)
Stroke Team Progress Note  HISTORY [name] [name] is an 80 y.o. female with a past medical history significant for HTN, hyperlipidemia, CAD s/p CABG, B12 deficiency, brought in by EMS as a code stroke due to acute onset of the above stated symptoms.  Patient was at her daughter's house when suddenly she was noted to be confused, " looking a feeling weird", with slurred speech and weakness of the left side. EMS was summoned and they stated that she was completely weak in he left side and had mild dysarthria that subsequently improved.  Initial NIHSS 7.  CT brain showed no acute abnormality.  Currently, she is alert, following commands appropriately, and denies HA, vertigo, double vision, difficulty swallowing, numbness, language or vision disturbances.No chest pain, SOB, or palpitations.   Patient was administerd TPA secondary to weakness and dysarthria. She was admitted to the neuro ICU for further evaluation and treatment.  SUBJECTIVE Resting comfortably. No overnight events. MRI completed overnight. BP stable  OBJECTIVE Most recent Vital Signs: Filed Vitals:   04/28/14 0500 04/28/14 0600 04/28/14 0700 04/28/14 0800  BP: 143/75 130/84 141/69   Pulse: 84 94 82   Temp:    97.7 F (36.5 C)  TempSrc:    Axillary  Resp: 16 29 15    Height:      Weight:      SpO2: 91% 97% 94%    CBG (last 3)  No results found for this basename: GLUCAP,  in the last 72 hours  IV Fluid Intake:   . sodium chloride 75 mL/hr at 04/27/14 2244    MEDICATIONS  . aspirin EC  325 mg Oral Daily  . pantoprazole (PROTONIX) IV  40 mg Intravenous QHS  . pneumococcal 23 valent vaccine  0.5 mL Intramuscular Once   PRN:  acetaminophen, acetaminophen, labetalol, senna-docusate  Diet:  Cardiac  Activity:   Up with assistance DVT Prophylaxis:  SCD  CLINICALLY SIGNIFICANT STUDIES Basic Metabolic Panel:   Recent Labs Lab 04/26/14 1746 04/26/14 1757  NA 140 140  K 4.1 3.9  CL 100 105  CO2 25  --   GLUCOSE  106* 110*  BUN 17 16  CREATININE 0.92 1.00  CALCIUM 10.6*  --    Liver Function Tests:   Recent Labs Lab 04/26/14 1746  AST 31  ALT 20  ALKPHOS 46  BILITOT 0.3  PROT 6.9  ALBUMIN 3.8   CBC:   Recent Labs Lab 04/26/14 1746 04/26/14 1757  WBC 6.8  --   NEUTROABS 4.0  --   HGB 10.9* 12.6  HCT 35.1* 37.0  MCV 82.4  --   PLT 254  --    Coagulation:   Recent Labs Lab 04/26/14 1746  LABPROT 13.0  INR 0.98   Cardiac Enzymes: No results found for this basename: CKTOTAL, CKMB, CKMBINDEX, TROPONINI,  in the last 168 hours Urinalysis: No results found for this basename: COLORURINE, APPERANCEUR, LABSPEC, PHURINE, GLUCOSEU, HGBUR, BILIRUBINUR, KETONESUR, PROTEINUR, UROBILINOGEN, NITRITE, LEUKOCYTESUR,  in the last 168 hours Lipid Panel    Component Value Date/Time   CHOL 136 04/27/2014 0225   TRIG 63 04/27/2014 0225   HDL 72 04/27/2014 0225   CHOLHDL 1.9 04/27/2014 0225   VLDL 13 04/27/2014 0225   LDLCALC 51 04/27/2014 0225   HgbA1C  Lab Results  Component Value Date   HGBA1C 6.4* 04/27/2014    Urine Drug Screen:   No results found for this basename: labopia,  cocainscrnur,  labbenz,  amphetmu,  thcu,  labbarb  Alcohol Level:   Recent Labs Lab 04/26/14 1746  ETH <11    Ct Angio Head W/cm &/or Wo Cm  04/26/2014   CLINICAL DATA:  Code stroke.  CT a. left-sided weakness.  EXAM: CT ANGIOGRAPHY HEAD  TECHNIQUE: Multidetector CT imaging of the head was performed using the standard protocol during bolus administration of intravenous contrast. Multiplanar CT image reconstructions and MIPs were obtained to evaluate the vascular anatomy.  CONTRAST:  5mL OMNIPAQUE IOHEXOL 350 MG/ML SOLN  COMPARISON:  Head CT ear earlier same day  FINDINGS: Arterial opacification is excellent. Both internal carotid arteries are patent through the skullbase. There is peripheral calcification in the carotid siphon regions but no flow-limiting stenosis. There is 50% stenosis of the supra clinoid  internal carotid artery on the right. Anterior and middle cerebral vessels appear patent without stenosis or occlusion. No aneurysm or vascular malformation.  Both vertebral arteries are widely patent through the foramen magnum. There is some atherosclerotic calcification but no stenosis. The basilar artery is of normal caliber and is widely patent. Posterior circulation branch vessels appear patent.  Review of the MIP images confirms the above findings.  IMPRESSION: No definable vessel occlusion.  No flow limiting stenosis.  Atherosclerotic calcification in the carotid siphon regions. 50% stenosis of the supra clinoid ICA on the right.   Electronically Signed   By: Nelson Chimes M.D.   On: 04/26/2014 19:56   Ct Head Wo Contrast  04/26/2014   ADDENDUM REPORT: 04/26/2014 18:18  ADDENDUM: The original report was by Dr. Van Clines. The following addendum is by Dr. Van Clines:  I paged Dr. Armida Sans regarding this case at 6:03 p.m. and spoke to him regarding the findings at 6:07 p.m. by telephone.   Electronically Signed   By: Sherryl Barters M.D.   On: 04/26/2014 18:18   04/26/2014   CLINICAL DATA:  Code stroke.  Left-sided weakness.  A phase EF.  EXAM: CT HEAD WITHOUT CONTRAST  TECHNIQUE: Contiguous axial images were obtained from the base of the skull through the vertex without intravenous contrast.  COMPARISON:  03/20/2007  FINDINGS: Remote lacunar infarct, left external capsule. Periventricular white matter and corona radiata hypodensities favor chronic ischemic microvascular white matter disease.  Mildly advanced cerebral and cerebellar atrophy for age.  The brainstem, cerebral peduncle is, thalami, basal ganglia, basilar cisterns, and ventricular system appear unremarkable. No intracranial hemorrhage, mass lesion, or acute CVA.  There is atherosclerotic calcification of the cavernous carotid arteries bilaterally.  IMPRESSION: 1. No acute intracranial findings. 2. Chronic microvascular white matter  disease, remote left external capsule lacunar infarct, and cerebral and cerebellar atrophy.  Electronically Signed: By: Sherryl Barters M.D. On: 04/26/2014 18:03   Mr Brain Wo Contrast  04/27/2014   CLINICAL DATA:  Left-sided weakness  EXAM: MRI HEAD WITHOUT CONTRAST  TECHNIQUE: Multiplanar, multiecho pulse sequences of the brain and surrounding structures were obtained without intravenous contrast.  COMPARISON:  CT studies from yesterday.  FINDINGS: There are 4 subcentimeter foci of acute infarction within the right hemisphere consistent with emboli within the right middle cerebral artery territory. 1 is present in the deep insula. One is present along the lateral cortical surface of the posterior frontal lobe. Two are present at the cortical surfaces of the parietal lobe. No large confluent infarction. No evidence of hemorrhage or mass effect.  The brainstem and cerebellum are normal. The cerebral hemispheres show chronic small-vessel ischemic changes affecting the deep and subcortical white matter. No mass lesion, hydrocephalus or extra-axial  collection. No pituitary mass. No inflammatory sinus disease.  IMPRESSION: Four sub cm foci of acute infarction scattered about the right middle cerebral artery distribution consistent with embolic infarctions. No large confluent infarction, evidence of hemorrhage, or mass effect.   Electronically Signed   By: Nelson Chimes M.D.   On: 04/27/2014 18:44   Dg Chest Port 1 View  04/26/2014   CLINICAL DATA:  Stroke.  EXAM: PORTABLE CHEST - 1 VIEW  COMPARISON:  None.  FINDINGS: The heart size and mediastinal contours are within normal limits. Status post coronary artery bypass graft. No pneumothorax or pleural effusion is noted. Both lungs are clear. The visualized skeletal structures are unremarkable.  IMPRESSION: No acute cardiopulmonary abnormality seen.   Electronically Signed   By: Sabino Dick M.D.   On: 04/26/2014 18:44    CT of the brain    MRA of the brain     2D Echocardiogram    Carotid Doppler    CXR    Therapy Recommendations pending  Physical Exam   Cardiac: no murmurs.  Lungs: clear.  Abdomen: soft, no tender, no mass.  Extremities: no edema.  CV: pulses palpable throughout  Neurologic Examination:  General:  Mental Status:  Alert, oriented, thought content appropriate. Speech fluent without evidence of aphasia. Able to follow 3 step commands without difficulty. Mild dysphonia (chronic problem) Cranial Nerves:  II: Visual fields grossly normal, pupils equal, round, reactive to light and accommodation  III,IV, VI: ptosis not present, extra-ocular motions intact bilaterally  V,VII: smile asymmetric due to mild left face weakness, facial light touch sensation normal bilaterally  VIII: hearing normal bilaterally  XII: midline tongue extension without atrophy or fasciculations  Motor:  Minimal Left hemiparesis, arm greater than leg  Tone and bulk:normal tone throughout; no atrophy noted  Sensory: Sensation intact to LT throughout. No left sided neglect.  Deep Tendon Reflexes:  2+ all over  Plantars:  Right: downgoing Left: downgoing  Cerebellar:  normal finger-to-nose, normal heel-to-shin test  Gait: not tested  ASSESSMENT [name] [name] is a 80 y.o. female presenting with left sided weakness. Status post IV t-PA on 7/17 at 1822. MRI shows small scattered infarcts in the R MCA distribution. Infarct felt to be embolic secondary to unknown etiology.  On ASA 81mg  prior to admission. Now on ASA 325mg  daily. Patient with resultant minimal left sided weakness. Stroke work up underway.   CAD  LDL   HTN  Spasmodic dsyphonia   Hospital day # 2  TREATMENT/PLAN  ASA 325mg  daily  TTE. Will consider loop and TEE if negative  Carotid doppler 1-39% stenosis  Rehab evaluation  Telemetry  Will hold statin for LDL of 51  Transfer out of ICU today    Jim Like, DO [facility] Pager: [phone]    To contact Stroke Continuity provider, please refer to [email or web address] After hours, contact General Neurology

## 2014-04-28 NOTE — Progress Notes (Signed)
  Echocardiogram 2D Echocardiogram has been performed.  [name] [name] 04/28/2014, 9:46 AM

## 2014-04-28 NOTE — Progress Notes (Signed)
Report called to receiving nurse Mickel Baas, on [facility]. Pt remains stable; all belongings and medications with patient at time of transfer. Pt updated with transfer and denies any needs. 04/28/2014 9:04 AM Lottie Dawson

## 2014-04-29 DIAGNOSIS — I4891 Unspecified atrial fibrillation: Secondary | ICD-10-CM

## 2014-04-29 MED ORDER — APIXABAN 2.5 MG PO TABS
2.5000 mg | ORAL_TABLET | Freq: Two times a day (BID) | ORAL | Status: DC
  Administered 2014-04-29 – 2014-04-30 (×2): 2.5 mg via ORAL
  Filled 2014-04-29 (×3): qty 1

## 2014-04-29 MED ORDER — SODIUM CHLORIDE 0.9 % IV SOLN: INTRAVENOUS | Status: DC

## 2014-04-29 MED ORDER — ATORVASTATIN CALCIUM 10 MG PO TABS
10.0000 mg | ORAL_TABLET | Freq: Every day | ORAL | Status: DC
  Administered 2014-04-29: 10 mg via ORAL
  Filled 2014-04-29 (×2): qty 1

## 2014-04-29 MED ORDER — METOPROLOL TARTRATE 12.5 MG HALF TABLET
12.5000 mg | ORAL_TABLET | Freq: Two times a day (BID) | ORAL | Status: DC
  Administered 2014-04-29 – 2014-04-30 (×2): 12.5 mg via ORAL
  Filled 2014-04-29 (×3): qty 1

## 2014-04-29 NOTE — Progress Notes (Signed)
Stroke Team Progress Note  HISTORY Chief Complaint: CODE STROKE: LEFT HEMIPARESIS, LEFT FACE WEAKNESS, DYSARTHRIA.  [name] [name] is an 80 y.o. female with a past medical history significant for HTN, hyperlipidemia, CAD s/p CABG, B12 deficiency, brought in by EMS as a code stroke due to acute onset of the above stated symptoms.  Patient was at her daughter's house when suddenly she was noted to be confused, " looking a feeling weird", with slurred speech and weakness of the left side. EMS was summoned and they stated that she was completely weak in he left side and had mild dysarthria that subsequently improved.  Initial NIHSS 7.  CT brain showed no acute abnormality.  Currently, she is alert, following commands appropriately, and denies HA, vertigo, double vision, difficulty swallowing, numbness, language or vision disturbances.No chest pain, SOB, or palpitations.   Patient was administered TPA secondary to weakness and dysarthria. She was admitted to the neuro ICU for further evaluation and treatment.  SUBJECTIVE No acute events overnight. Family is not at the bedside during round today. The patient is alert and conversant, and denies complaints. She stated that she recovered well so far. Tele and EKG showed afib.  OBJECTIVE Most recent Vital Signs: Filed Vitals:   04/28/14 2000 04/28/14 2345 04/29/14 0400 04/29/14 0808  BP: 158/74 122/61 135/60 124/65  Pulse: 51 76 76 98  Temp: 98.5 F (36.9 C) 98.2 F (36.8 C) 98.9 F (37.2 C) 98.3 F (36.8 C)  TempSrc: Oral Oral Oral Oral  Resp: 18 18 18 18   Height:      Weight:  54.233 kg (119 lb 9 oz) 54.233 kg (119 lb 9 oz)   SpO2: 98% 98% 99% 97%   CBG (last 3)  No results found for this basename: GLUCAP,  in the last 72 hours  IV Fluid Intake:      MEDICATIONS  . aspirin EC  325 mg Oral Daily  . pantoprazole  40 mg Oral QHS  . pneumococcal 23 valent vaccine  0.5 mL Intramuscular Once   PRN:  acetaminophen, acetaminophen,  labetalol, senna-docusate  Diet:  Cardiac  Activity:   Up with assistance DVT Prophylaxis:  SCDs  CLINICALLY SIGNIFICANT STUDIES Basic Metabolic Panel:   Recent Labs Lab 04/26/14 1746 04/26/14 1757  NA 140 140  K 4.1 3.9  CL 100 105  CO2 25  --   GLUCOSE 106* 110*  BUN 17 16  CREATININE 0.92 1.00  CALCIUM 10.6*  --    Liver Function Tests:   Recent Labs Lab 04/26/14 1746  AST 31  ALT 20  ALKPHOS 46  BILITOT 0.3  PROT 6.9  ALBUMIN 3.8   CBC:   Recent Labs Lab 04/26/14 1746 04/26/14 1757  WBC 6.8  --   NEUTROABS 4.0  --   HGB 10.9* 12.6  HCT 35.1* 37.0  MCV 82.4  --   PLT 254  --    Coagulation:   Recent Labs Lab 04/26/14 1746  LABPROT 13.0  INR 0.98   Cardiac Enzymes: No results found for this basename: CKTOTAL, CKMB, CKMBINDEX, TROPONINI,  in the last 168 hours Urinalysis: No results found for this basename: COLORURINE, APPERANCEUR, LABSPEC, PHURINE, GLUCOSEU, HGBUR, BILIRUBINUR, KETONESUR, PROTEINUR, UROBILINOGEN, NITRITE, LEUKOCYTESUR,  in the last 168 hours Lipid Panel    Component Value Date/Time   CHOL 136 04/27/2014 0225   TRIG 63 04/27/2014 0225   HDL 72 04/27/2014 0225   CHOLHDL 1.9 04/27/2014 0225   VLDL 13 04/27/2014 0225   LDLCALC  51 04/27/2014 0225   HgbA1C  Lab Results  Component Value Date   HGBA1C 6.4* 04/27/2014    Urine Drug Screen:   No results found for this basename: labopia,  cocainscrnur,  labbenz,  amphetmu,  thcu,  labbarb    Alcohol Level:   Recent Labs Lab 04/26/14 1746  ETH <11   CT head 04/26/14 IMPRESSION:  1. No acute intracranial findings.  2. Chronic microvascular white matter disease, remote left external  capsule lacunar infarct, and cerebral and cerebellar atrophy.  CTA  04/26/14 IMPRESSION:  No definable vessel occlusion. No flow limiting stenosis.  Atherosclerotic calcification in the carotid siphon regions. 50%  stenosis of the supra clinoid ICA on the right.  Mr Brain Wo Contrast  04/27/2014  IMPRESSION: Four sub cm foci of acute infarction scattered about the right middle cerebral artery distribution consistent with embolic infarctions. No large confluent infarction, evidence of hemorrhage, or mass effect.     2D Echocardiogram 04/28/14 EF 50-55%, severe pulmonary hypertension  Carotid Doppler  04/27/14 <39% stenosis bilat  EKG 04/27/14 Atrial fibrillation Nonspecific ST and T wave abnormality , probably digitalis effect  CXR  04/26/14 IMPRESSION:  No acute cardiopulmonary abnormality seen.    Therapy Recommendations no follow up needed  Physical Exam  Blood pressure 153/76, pulse 84, temperature 97.8 F (36.6 C), temperature source Oral, resp. rate 18, height 4\' 11"  (1.499 m), weight 54.233 kg (119 lb 9 oz), SpO2 98.00%.   Gen: Patient is well developed, well nourished elderly woman in no acute distress.  Cardiac: irregularly irregular rate and rhythm. S1S2 audible. No M/R/G.  Extremities: Cap refill <2 secs. No cyanosis or edema.  Pulmonary: Respirations regular, symmetric. Lungs clear to auscultation bilat. Abd: Soft, non-tender. BS audible x 4 quadrants.  G/U: Deferred  MS: Alert, follows commands. Oriented to person, place, time, and event. Speech: Speech fluent and non-dysarthric. Able to name and repeat.  CN: No visual field cut. PERRL. EOMs intact. Facial sensation intact V1-3. No facial droop. Hearing grossly intact. Strong cough. Sternocleidomastoids and trapezius 5/5 strength. Tongue midline, full strength, no atrophy or fasciculations.  Strength: 5/5 in all four extremities proximally and distally.  Sensation: Intact to light touch in all four extremities.  Coordination: No ataxia or dysmetria on FTN or HTS bilat. Gait steady.    Reflexes: not assessed NIHSS 0  ASSESSMENT [name] is a 80 y.o. female with past medical history significant for HTN, hyperlipidemia, CAD s/p CABG, B12 deficiency presenting with slurred speech and left sided weakness.  Status post IV t-PA on 7/17 at 1822. MRI shows small scattered infarcts in the R MCA distribution. Infarct felt to be embolic secondary to atrial fibrillation.  On ASA 81mg  prior to admission. Now on ASA 325mg  daily. Patient now back to baseline. Stroke work up underway.  Stroke: R MCA infarcts likely embolic secondary to atrial fibrillation  Carotid US shows <39% stenosis bilat  2D Echo shows EF 50-55%, severe pulmonary hypertension  Currently on ASA 325 for antiplatelet therapy  EKG shows atrial fibrillation, will start anticoagulation  Tele monitoring showed afib  LDL 51, no need for statin as meeting goal of LDL <70  HgbA1c 6.4, elevated, educate patient on dietary changes for diabetes prevention  Afib  New diagnosed afib, likely the cause of her embolic stroke.  Put on eliquis 2.5mg  bid after discuss with pt. She is less than 60kg and in 2 month she will be 80 yo, so I choose to give her lower  dose of eliquis.  Added metoprolol 12.5mg  bid for rate control.   HTN/CAD  Patient on benicar 40 mg daily, Bisoprolol-HCTZ 5/6.25 mg daily, nitroglycerine 0.4 mg SL PRN at home  Added metoprolol 12.5mg  bid for BP control and afib rate control.  BP 122-163/61-76 in last 24 hours  Undernutrition  Pt's weight low   Dietary consult ordered  Hospital day # 3   SIGNED Delbert Phenix, MSN, ANP-C, CNRN, MSCS [facility] Stroke Team [phone]   I, the attending vascular neurologist, have personally obtained a history, examined the patient, evaluated laboratory data and individually viewed imaging studies, and formulated the assessment and plan of care.  I have made any additions or clarifications directly to the above note and agree with the findings and plan as currently documented.   Rosalin Hawking, MD PhD 04/29/2014 5:17 PM      To contact Stroke Continuity provider, please refer to [email or web address] After hours, contact General Neurology

## 2014-04-29 NOTE — Evaluation (Signed)
Occupational Therapy Evaluation Patient Details Name: [name] [name] MRN: [id number] DOB: [birth year] Today's Date: 04/29/2014    History of Present Illness [name] is a 80 y.o. female presenting with left sided weakness. Status post IV t-PA on 7/17 at 1822. Confirmatory imaging pending but suspect a R MCA infarct. Infarct felt to be embolic secondary to unknown etiology.     Clinical Impression   Patient presents during OT evaluation as completely independent with BADL's.  Patient without obvious cognitive, physical, perceptual, sensory deficits resultant of this stroke.  Patient eager to return home with husband.  No further OT warranted at this time.      Follow Up Recommendations  No OT follow up    Equipment Recommendations  None recommended by OT    Recommendations for Other Services       Precautions / Restrictions Precautions Precautions: None Restrictions Weight Bearing Restrictions: No      Mobility Bed Mobility Overal bed mobility: Independent                Transfers Overall transfer level: Independent   Transfers: Sit to/from [employer] to Stand: Independent         General transfer comment: Standing at sink to bathe, sit to stand without assistance, ambulating in room without device    Balance Overall balance assessment: Independent   Sitting balance-Leahy Scale: Normal       Standing balance-Leahy Scale: Good                              ADL Overall ADL's : Independent                                             Vision Eye Alignment: Within Functional Limits Alignment/Gaze Preference: Within Defined Limits Ocular Range of Motion: Within Functional Limits Tracking/Visual Pursuits: Able to track stimulus in all quads without difficulty Saccades: Within functional limits Convergence: Within functional limits         Perception Perception Perception Tested?:  Yes Comments: Appears WFL   Praxis Praxis Praxis tested?: Within functional limits    Pertinent Vitals/Pain Denies pain, although reports stiffness in right hand due to not taking prednisone in the hospital     Hand Dominance Right   Extremity/Trunk Assessment Upper Extremity Assessment Upper Extremity Assessment: Overall WFL for tasks assessed (polyrheumatica - takes prednisone, increased inflammation in hospital)   Lower Extremity Assessment Lower Extremity Assessment: Defer to PT evaluation   Cervical / Trunk Assessment Cervical / Trunk Assessment: Normal   Communication Communication Communication: No difficulties   Cognition Arousal/Alertness: Awake/alert Behavior During Therapy: WFL for tasks assessed/performed Overall Cognitive Status: Within Functional Limits for tasks assessed                     General Comments       Exercises       Shoulder Instructions      Home Living   Living Arrangements: Spouse/significant other Available Help at Discharge: Family;Available 24 hours/day Type of Home: House Home Access: Stairs to enter [employer] of Steps: 4 Entrance Stairs-Rails: None Home Layout: Two level;Bed/bath upstairs Alternate Level Stairs-Number of Steps: 15 Alternate Level Stairs-Rails: Right;Left;Can reach both Bathroom Shower/Tub: Tub only         Home Equipment: None  Lives With: Significant other    Prior Functioning/Environment Level of Independence: Independent             OT Diagnosis:     OT Problem List:     OT Treatment/Interventions:      OT Goals(Current goals can be found in the care plan section) Acute Rehab OT Goals Patient Stated Goal: to go home with my husband OT Goal Formulation: With patient Potential to Achieve Goals: Good  OT Frequency:     Barriers to D/C:            Co-evaluation              End of Session Equipment Utilized During Treatment:  (none)  Activity  Tolerance: Patient tolerated treatment well Patient left: in chair;with call bell/phone within reach   Time: 0930-0950 OT Time Calculation (min): 20 min Charges:  OT General Charges $OT Visit: 1 Procedure OT Evaluation $Initial OT Evaluation Tier I: 1 Procedure OT Treatments $Self Care/Home Management : 8-22 mins G-Codes:    Mariah Milling May 27, 2014, 10:00 AM

## 2014-04-29 NOTE — Progress Notes (Addendum)
    [facility] has been requested to perform a transesophageal echocardiogram on 04/30/14 @ 1pm for CVA. She will also have a loop recorder placed.  After careful review of history and examination, the risks and benefits of transesophageal echocardiogram have been explained including risks of esophageal damage, perforation (1:10,000 risk), bleeding, pharyngeal hematoma as well as other potential complications associated with conscious sedation including aspiration, arrhythmia, respiratory failure and death. Alternatives to treatment were discussed, questions were answered. Patient is willing to proceed. NPO after midnight  STERN, Zebulin Siegel, PA-C 04/29/2014 2:45 PM      THIS WAS CANCELLED AS THEY FOUND HER TO BE IN AFIB.

## 2014-04-29 NOTE — Progress Notes (Signed)
Speech Language Pathology Treatment: Dysphagia;Cognitive-Linquistic  Patient Details Name: [name] [name] MRN: [id number] DOB: [birth year] Today's Date: 04/29/2014 Time: [id number] SLP Time Calculation (min): 13 min  Assessment / Plan / Recommendation Clinical Impression  Skilled treatment session focused on addressing cognition and dysphagia goals.  SLP facilitated session with divided attention tasks: patient self-feeding while completing complex verbal problem solving tasks.  Patient consumed regular textures and thin liquids via straw with no overt s/s of aspiration independently.  Patient also verbalized adequate problem solving for various home management tasks and could identify alternate solutions to given problems.  Overall, patient demonstrates cognitive-linguistic and swallow abilities that are [facility].  SLP educated patient regarding follow-up option of outpatient SLP therapy if needed for complex functional problem solving.  Patient verbalized understanding of recommendations if home management tasks are not at baseline function.  No further acute SLP needs at this time; SLP signing off.        HPI HPI: [name] [name] is an 80 y.o. female with a past medical history significant for HTN, hyperlipidemia, CAD s/p CABG, B12 deficiency, brought in by EMS as a code stroke on 04/26/14.   CT: No acute intracranial findings.   Pertinent Vitals none  SLP Plan  All goals met;Discharge SLP treatment due to (comment) (goals and education complete)    Recommendations Diet recommendations: Regular;Thin liquid Liquids provided via: Cup;Straw Medication Administration: Whole meds with liquid Supervision: Patient able to self feed (none needed) Compensations:  (n/a) Postural Changes and/or Swallow Maneuvers: Out of bed for meals;Seated upright 90 degrees              Oral Care Recommendations: Oral care BID Follow up Recommendations: None Plan: All goals met;Discharge SLP treatment due to (comment)  (goals and education complete)    GO    Carmelia Roller., CCC-SLP [phone]  [place] 04/29/2014, 11:42 AM

## 2014-04-30 DIAGNOSIS — I4891 Unspecified atrial fibrillation: Secondary | ICD-10-CM | POA: Diagnosis present

## 2014-04-30 DIAGNOSIS — I6350 Cerebral infarction due to unspecified occlusion or stenosis of unspecified cerebral artery: Secondary | ICD-10-CM

## 2014-04-30 SURGERY — Surgical Case
Anesthesia: *Unknown

## 2014-04-30 MED ORDER — ATORVASTATIN CALCIUM 10 MG PO TABS: 10.0000 mg | ORAL_TABLET | Freq: Every day | ORAL | Status: DC

## 2014-04-30 MED ORDER — APIXABAN 2.5 MG PO TABS: 2.5000 mg | ORAL_TABLET | Freq: Two times a day (BID) | ORAL | Status: DC

## 2014-04-30 MED ORDER — METOPROLOL TARTRATE 25 MG PO TABS
25.0000 mg | ORAL_TABLET | Freq: Two times a day (BID) | ORAL | Status: DC
  Filled 2014-04-30: qty 1

## 2014-04-30 MED ORDER — METOPROLOL TARTRATE 25 MG PO TABS: 25.0000 mg | ORAL_TABLET | Freq: Two times a day (BID) | ORAL | Status: DC

## 2014-04-30 NOTE — Progress Notes (Signed)
INITIAL NUTRITION ASSESSMENT  DOCUMENTATION CODES Per approved criteria  -Non-severe (moderate) malnutrition in the context of chronic illness  Pt meets criteria for moderate MALNUTRITION in the context of chronic illness as evidenced by moderate fat and muscle wasting.  INTERVENTION: -Ensure Complete po BID, each supplement provides 350 kcal and 13 grams of protein -RD will continue to monitor for nutrition care plan  NUTRITION DIAGNOSIS: Inadequate oral intake related to chronic illness as evidenced by wt loss.   Goal: Pt to meet >/= 90% of their estimated nutrition needs   Monitor:  Wt trends, po intake, acceptance of supplements, labs  Reason for Assessment: Consult for nutrition assessment  80 y.o. female  Admitting Dx: <principal problem not specified>  ASSESSMENT: 80 y.o. female with a past medical history significant for HTN, hyperlipidemia, CAD s/p CABG, B12 deficiency, brought in by EMS as a code stroke due to acute onset of left hemiparesis, left face weakness, and dysarthria.  - Pt reports that her weight has dropped about 6 lbs for unknown reasons. She reports having a good appetite, and says that she is eating well. She says "the older she gets, the more weight she loses." Pt was advised to drink high-protein, high-calorie nutritional supplements at home to supplement her diet. Meal completion is recorded as 50-100%.  Nutrition Focused Physical Exam:  Subcutaneous Fat:  Orbital Region: moderate wasting Upper Arm Region: moderate wasting Thoracic and Lumbar Region: moderate wasting  Muscle:  Temple Region: moderate wasting Clavicle Bone Region: moderate wasting Clavicle and Acromion Bone Region: moderate wasting Scapular Bone Region: moderate wasting Dorsal Hand: moderate wasting Patellar Region: WNL Anterior Thigh Region: WNL Posterior Calf Region: WNL  Edema: none  Height: Ht Readings from Last 1 Encounters:  04/28/14 4\' 11"  (1.499 m)     Weight: Wt Readings from Last 1 Encounters:  04/30/14 119 lb 0.8 oz (54 kg)    Ideal Body Weight: 43.2 kg  % Ideal Body Weight: 125%  Wt Readings from Last 10 Encounters:  04/30/14 119 lb 0.8 oz (54 kg)  07/03/13 125 lb (56.7 kg)  11/14/12 144 lb 1.9 oz (65.372 kg)  11/09/11 144 lb (65.318 kg)  11/06/10 142 lb 12 oz (64.751 kg)    Usual Body Weight: 125 lbs  % Usual Body Weight: 95%  BMI:  Body mass index is 24.03 kg/(m^2).  Estimated Nutritional Needs: Kcal: 1400-1600 Protein: 70-80 g Fluid: ~1.6 L/day  Skin: WNL  Diet Order: Cardiac  EDUCATION NEEDS: -Education needs addressed   Intake/Output Summary (Last 24 hours) at 04/30/14 1427 Last data filed at 04/30/14 0900  Gross per 24 hour  Intake    240 ml  Output    850 ml  Net   -610 ml    Last BM: prior to admission   Labs:   Recent Labs Lab 04/26/14 1746 04/26/14 1757  NA 140 140  K 4.1 3.9  CL 100 105  CO2 25  --   BUN 17 16  CREATININE 0.92 1.00  CALCIUM 10.6*  --   GLUCOSE 106* 110*    CBG (last 3)  No results found for this basename: GLUCAP,  in the last 72 hours  Scheduled Meds: . apixaban  2.5 mg Oral BID  . atorvastatin  10 mg Oral q1800  . metoprolol tartrate  25 mg Oral BID  . pantoprazole  40 mg Oral QHS  . pneumococcal 23 valent vaccine  0.5 mL Intramuscular Once    Continuous Infusions:   Past Medical History  Diagnosis Date  . CAD (coronary artery disease)     multivessel  . Hypercholesterolemia   . Hypertension   . Osteopenia   . Hiatal hernia   . Vitamin B12 deficiency   . Iron deficiency     Past Surgical History  Procedure Laterality Date  . Coronary artery bypass graft      x 5  . Left breast biopsy    . Partial hystercetomy    . Spasmodic dysphonia      Terrace Arabia RD, LDN

## 2014-04-30 NOTE — Progress Notes (Addendum)
Stroke Team Progress Note  HISTORY Chief Complaint: CODE STROKE: LEFT HEMIPARESIS, LEFT FACE WEAKNESS, DYSARTHRIA.  [name] [name] is an 80 y.o. female with a past medical history significant for HTN, hyperlipidemia, CAD s/p CABG, B12 deficiency, brought in by EMS as a code stroke due to acute onset of the above stated symptoms.  Patient was at her daughter's house when suddenly she was noted to be confused, " looking a feeling weird", with slurred speech and weakness of the left side. EMS was summoned and they stated that she was completely weak in he left side and had mild dysarthria that subsequently improved.  Initial NIHSS 7.  CT brain showed no acute abnormality.  Currently, she is alert, following commands appropriately, and denies HA, vertigo, double vision, difficulty swallowing, numbness, language or vision disturbances.No chest pain, SOB, or palpitations.   Patient was administered TPA secondary to weakness and dysarthria. She was admitted to the neuro ICU for further evaluation and treatment.  SUBJECTIVE No acute events overnight. Family is not at the bedside during round today. The patient is alert and conversant, and denies complaints. Put on eliquis 2.5mg  bid for stroke prevention. DVT screening negative.   OBJECTIVE Most recent Vital Signs: Filed Vitals:   04/29/14 1632 04/29/14 2000 04/29/14 2359 04/30/14 0400  BP: 136/67 167/86 145/66 141/77  Pulse: 70 97 75 103  Temp: 97.8 F (36.6 C) 98.4 F (36.9 C) 98.3 F (36.8 C) 98.1 F (36.7 C)  TempSrc: Oral Oral Oral Oral  Resp: 18 18 18 18   Height:      Weight:    54 kg (119 lb 0.8 oz)  SpO2: 99% 100% 99% 95%   CBG (last 3)  No results found for this basename: GLUCAP,  in the last 72 hours  IV Fluid Intake:      MEDICATIONS  . apixaban  2.5 mg Oral BID  . atorvastatin  10 mg Oral q1800  . metoprolol tartrate  12.5 mg Oral BID  . pantoprazole  40 mg Oral QHS  . pneumococcal 23 valent vaccine  0.5 mL Intramuscular  Once   PRN:  acetaminophen, acetaminophen, labetalol, senna-docusate  Diet:  Cardiac  Activity:   Up with assistance DVT Prophylaxis:  SCDs  CLINICALLY SIGNIFICANT STUDIES Basic Metabolic Panel:   Recent Labs Lab 04/26/14 1746 04/26/14 1757  NA 140 140  K 4.1 3.9  CL 100 105  CO2 25  --   GLUCOSE 106* 110*  BUN 17 16  CREATININE 0.92 1.00  CALCIUM 10.6*  --    Liver Function Tests:   Recent Labs Lab 04/26/14 1746  AST 31  ALT 20  ALKPHOS 46  BILITOT 0.3  PROT 6.9  ALBUMIN 3.8   CBC:   Recent Labs Lab 04/26/14 1746 04/26/14 1757  WBC 6.8  --   NEUTROABS 4.0  --   HGB 10.9* 12.6  HCT 35.1* 37.0  MCV 82.4  --   PLT 254  --    Coagulation:   Recent Labs Lab 04/26/14 1746  LABPROT 13.0  INR 0.98   Cardiac Enzymes: No results found for this basename: CKTOTAL, CKMB, CKMBINDEX, TROPONINI,  in the last 168 hours Urinalysis: No results found for this basename: COLORURINE, APPERANCEUR, LABSPEC, PHURINE, GLUCOSEU, HGBUR, BILIRUBINUR, KETONESUR, PROTEINUR, UROBILINOGEN, NITRITE, LEUKOCYTESUR,  in the last 168 hours Lipid Panel    Component Value Date/Time   CHOL 136 04/27/2014 0225   TRIG 63 04/27/2014 0225   HDL 72 04/27/2014 0225   CHOLHDL 1.9 04/27/2014  0225   VLDL 13 04/27/2014 0225   LDLCALC 51 04/27/2014 0225   HgbA1C  Lab Results  Component Value Date   HGBA1C 6.4* 04/27/2014    Urine Drug Screen:   No results found for this basename: labopia,  cocainscrnur,  labbenz,  amphetmu,  thcu,  labbarb    Alcohol Level:   Recent Labs Lab 04/26/14 1746  ETH <11   CT head 04/26/14 IMPRESSION:  1. No acute intracranial findings.  2. Chronic microvascular white matter disease, remote left external  capsule lacunar infarct, and cerebral and cerebellar atrophy.  CTA  04/26/14 IMPRESSION:  No definable vessel occlusion. No flow limiting stenosis.  Atherosclerotic calcification in the carotid siphon regions. 50%  stenosis of the supra clinoid ICA on the  right.  Mr Brain Wo Contrast  04/27/2014 IMPRESSION: Four sub cm foci of acute infarction scattered about the right middle cerebral artery distribution consistent with embolic infarctions. No large confluent infarction, evidence of hemorrhage, or mass effect.     2D Echocardiogram 04/28/14 EF 50-55%, severe pulmonary hypertension  Transesophageal Echocardiogram cancelled due to afib on EKG  Carotid Doppler  04/27/14 <39% stenosis bilat  Lower Extremity Ultrasound Prelim: no DVT  EKG 04/27/14  Atrial fibrillation Nonspecific ST and T wave abnormality , probably digitalis effect  CXR  04/26/14 IMPRESSION:  No acute cardiopulmonary abnormality seen.    Therapy Recommendations no follow up needed  Physical Exam  Blood pressure 141/77, pulse 103, temperature 98.1 F (36.7 C), temperature source Oral, resp. rate 18, height 4\' 11"  (1.499 m), weight 54 kg (119 lb 0.8 oz), SpO2 95.00%.   Gen: Patient is well developed, well nourished elderly woman in no acute distress.  Cardiac: irregularly irregular rate and rhythm. S1S2 audible. No M/R/G.  Extremities: Cap refill <2 secs. No cyanosis or edema.  Pulmonary: Respirations regular, symmetric. Lungs clear to auscultation bilat. Abd: Soft, non-tender. BS audible x 4 quadrants.  G/U: Deferred  MS: Alert, follows commands. Oriented to person, place, time, and event. Speech: Speech fluent and non-dysarthric. Able to name and repeat.  CN: No visual field cut. PERRL. EOMs intact. Facial sensation intact V1-3. No facial droop. Hearing grossly intact. Strong cough. Sternocleidomastoids and trapezius 5/5 strength. Tongue midline, full strength, no atrophy or fasciculations.  Strength: 5/5 in all four extremities proximally and distally.  Sensation: Intact to light touch in all four extremities.  Coordination: No ataxia or dysmetria on FTN or HTS bilat. Gait steady.    Reflexes: not assessed NIHSS 0  ASSESSMENT [name] is a 80 y.o. female  with past medical history significant for HTN, hyperlipidemia, CAD s/p CABG, B12 deficiency presenting with slurred speech and left sided weakness. Status post IV t-PA on 7/17 at 1822. MRI shows small scattered infarcts in the R MCA distribution. Infarct felt to be embolic secondary to atrial fibrillation.  On ASA 81mg  prior to admission. Now on eliquis for stroke prevention given afib. Patient now back to baseline.   Stroke: R MCA infarcts likely embolic secondary to atrial fibrillation  Carotid US shows <39% stenosis bilat  2D Echo shows EF 50-55%, severe pulmonary hypertension  EKG shows atrial fibrillation  Tele monitoring showed afib  LEUS shows no DVT  TEE cancelled due to afib on EKG  Started eliquis 2.5mg  bid due to her age and low weight  LDL 51, meeting goal of LDL <70, on low dose statin for stroke prevention  HgbA1c 6.4, elevated, educate patient on dietary changes for diabetes prevention  Afib  New diagnosed afib, likely the cause of her embolic stroke.  Put on eliquis 2.5mg  bid after discuss with pt. She is less than 60kg and in 2 month she will be 80 yo, so I choose to give her lower dose of eliquis.  Increased metoprolol to 25 mg bid for BP control and afib rate control.   HTN/CAD  Patient on benicar 40 mg daily, Bisoprolol-HCTZ 5/6.25 mg daily, nitroglycerine 0.4 mg SL PRN at home  BP 124-167/65-86 in last 24 hours  Increased metoprolol to 25 mg bid for BP control and afib rate control.  She needs to check her BP at home after discharge and call her PCP for appointment within a week.  Malnutrition  Pt's weight low   Non-severe (moderate) malnutrition in the context of chronic illness   Pt meets criteria for moderate MALNUTRITION in the context of chronic illness as evidenced by moderate fat and muscle wasting.  Nutrition consult appreciated and they are following.  Hospital day # 4   SIGNED Deirdre Marshell Garfinkel, MSN, ANP-C, CNRN, MSCS [facility]  Stroke Team [phone]    I, the attending vascular neurologist, have personally obtained a history, examined the patient, evaluated laboratory data, individually viewed imaging studies, and formulated the assessment and plan of care.  I have made any additions or clarifications directly to the above note and agree with the findings and plan as currently documented.    Rosalin Hawking, MD PhD 04/30/2014 5:12 PM   To contact Stroke Continuity provider, please refer to [email or web address] After hours, contact General Neurology

## 2014-04-30 NOTE — Care Management Note (Addendum)
    Page 1 of 1   04/30/2014     2:20:18 PM CARE MANAGEMENT NOTE 04/30/2014  Patient:  [name] [name]   Account Number:  [id number]  Date Initiated:  04/30/2014  Documentation initiated by:  GRAVES-BIGELOW,Darwin Rothlisberger  Subjective/Objective Assessment:   Pt admitted for acute infarct.     Action/Plan:   Plan for pt to be d/c on eliquis. Eliquis co pay card to be given to pt. Benefits check in process. Will make pt aware.   Anticipated DC Date:  05/01/2014   Anticipated DC Plan:  [place]  CM consult      Choice offered to / List presented to:             Status of service:  Completed, signed off Medicare Important Message given?  YES (If response is "NO", the following Medicare IM given date fields will be blank) Date Medicare IM given:  04/30/2014 Medicare IM given by:  GRAVES-BIGELOW,[place] Date Additional Medicare IM given:   Additional Medicare IM given by:    Discharge Disposition:  HOME/SELF CARE  Per UR Regulation:  Reviewed for med. necessity/level of care/duration of stay  If discussed at [place] of Stay Meetings, dates discussed:    Comments:  Pt would like a 90 day supply  Rx for Eliquis. Eliquis: per rep at [facility] care mark  estimated co-pay at retail $63.78 no auth required most major retail pharmacies

## 2014-04-30 NOTE — Progress Notes (Signed)
VASCULAR LAB PRELIMINARY  PRELIMINARY  PRELIMINARY  PRELIMINARY  Bilateral lower extremity venous duplex  completed.    Preliminary report:  Bilateral:  No evidence of DVT, superficial thrombosis, or Baker's Cyst.    Lynzy Rawles, RVT 04/30/2014, 9:39 AM

## 2014-04-30 NOTE — Discharge Instructions (Addendum)
Information on my medicine - ELIQUIS (apixaban)  Why was Eliquis prescribed for you? Eliquis was prescribed for you to reduce the risk of a blood clot forming that can cause a stroke if you have a medical condition called atrial fibrillation (a type of irregular heartbeat).  What do You need to know about Eliquis ? Take your Eliquis TWICE DAILY - one tablet in the morning and one tablet in the evening with or without food. If you have difficulty swallowing the tablet whole please discuss with your pharmacist how to take the medication safely.  Take Eliquis exactly as prescribed by your doctor and DO NOT stop taking Eliquis without talking to the doctor who prescribed the medication.  Stopping may increase your risk of developing a stroke.  Refill your prescription before you run out.  After discharge, you should have regular check-up appointments with your healthcare provider that is prescribing your Eliquis.  In the future your dose may need to be changed if your kidney function or weight changes by a significant amount or as you get older.  What do you do if you miss a dose? If you miss a dose, take it as soon as you remember on the same day and resume taking twice daily.  Do not take more than one dose of ELIQUIS at the same time to make up a missed dose.  Important Safety Information A possible side effect of Eliquis is bleeding. You should call your healthcare provider right away if you experience any of the following:   Bleeding from an injury or your nose that does not stop.   Unusual colored urine (red or dark brown) or unusual colored stools (red or black).   Unusual bruising for unknown reasons.   A serious fall or if you hit your head (even if there is no bleeding).  Some medicines may interact with Eliquis and might increase your risk of bleeding or clotting while on Eliquis. To help avoid this, consult your healthcare provider or pharmacist prior to using any new  prescription or non-prescription medications, including herbals, vitamins, non-steroidal anti-inflammatory drugs (NSAIDs) and supplements.  This website has more information on Eliquis (apixaban): [email or web address]. Atrial Fibrillation Atrial fibrillation is a type of irregular heart rhythm (arrhythmia). During atrial fibrillation, the upper chambers of the heart (atria) quiver continuously in a chaotic pattern. This causes an irregular and often rapid heart rate.  Atrial fibrillation is the result of the heart becoming overloaded with disorganized signals that tell it to beat. These signals are normally released one at a time by a part of the right atrium called the sinoatrial node. They then travel from the atria to the lower chambers of the heart (ventricles), causing the atria and ventricles to contract and pump blood as they pass. In atrial fibrillation, parts of the atria outside of the sinoatrial node also release these signals. This results in two problems. First, the atria receive so many signals that they do not have time to fully contract. Second, the ventricles, which can only receive one signal at a time, beat irregularly and out of rhythm with the atria.  There are three types of atrial fibrillation:   Paroxysmal. Paroxysmal atrial fibrillation starts suddenly and stops on its own within a week.  Persistent. Persistent atrial fibrillation lasts for more than a week. It may stop on its own or with treatment.  Permanent. Permanent atrial fibrillation does not go away. Episodes of atrial fibrillation may lead to permanent atrial fibrillation. Atrial fibrillation can  prevent your heart from pumping blood normally. It increases your risk of stroke and can lead to heart failure.  CAUSES   Heart conditions, including a heart attack, heart failure, coronary artery disease, and heart valve conditions.   Inflammation of the sac that surrounds the heart (pericarditis).  Blockage of an artery  in the lungs (pulmonary embolism).  Pneumonia or other infections.  Chronic lung disease.  Thyroid problems, especially if the thyroid is overactive (hyperthyroidism).  Caffeine, excessive alcohol use, and use of some illegal drugs.   Use of some medicines, including certain decongestants and diet pills.  Heart surgery.   Birth defects.  Sometimes, no cause can be found. When this happens, the atrial fibrillation is called lone atrial fibrillation. The risk of complications from atrial fibrillation increases if you have lone atrial fibrillation and you are age 78 years or older. RISK FACTORS  Heart failure.  Coronary artery disease.  Diabetes mellitus.   High blood pressure (hypertension).   Obesity.   Other arrhythmias.   Increased age. SYMPTOMS   A feeling that your heart is beating rapidly or irregularly.   A feeling of discomfort or pain in your chest.   Shortness of breath.   Sudden light-headedness or weakness.   Getting tired easily when exercising.   Urinating more often than normal (mainly when atrial fibrillation first begins).  In paroxysmal atrial fibrillation, symptoms may start and suddenly stop. DIAGNOSIS  Your health care provider may be able to detect atrial fibrillation when taking your pulse. Your health care provider may have you take a test called an ambulatory electrocardiogram (ECG). An ECG records your heartbeat patterns over a 24-hour period. You may also have other tests, such as:  Transthoracic echocardiogram (TTE). During echocardiography, sound waves are used to evaluate how blood flows through your heart.  Transesophageal echocardiogram (TEE).  Stress test. There is more than one type of stress test. If a stress test is needed, ask your health care provider about which type is best for you.   Chest X-ray exam.  Blood tests.  Computed tomography (CT). TREATMENT  Treatment may include:  Treating any underlying  conditions. For example, if you have an overactive thyroid, treating the condition may correct atrial fibrillation.  Taking medicine. Medicines may be given to control a rapid heart rate or to prevent blood clots, heart failure, or a stroke.  Having a procedure to correct the rhythm of the heart:  Electrical cardioversion. During electrical cardioversion, a controlled, low-energy shock is delivered to the heart through your skin. If you have chest pain, very low blood pressure, or sudden heart failure, this procedure may need to be done as an emergency.  Catheter ablation. During this procedure, heart tissues that send the signals that cause atrial fibrillation are destroyed.  Maze or minimaze procedure. During this surgery, thin lines of heart tissue that carry the abnormal signals are destroyed. The maze procedure is an open-heart surgery. The minimaze procedure is a minimally invasive surgery. This means that small cuts are made to access the heart instead of a large opening.  Pulmonary venous isolation. During this surgery, tissue around the veins that carry blood from the lungs (pulmonary veins) is destroyed. This tissue is thought to carry the abnormal signals. HOME CARE INSTRUCTIONS   Only take medicines that your health care provider approves, and take them as directed. Some medicines can make atrial fibrillation worse or recur.  If blood thinners were prescribed by your health care provider, take them exactly  as directed. Too much blood-thinning medicine can cause bleeding. If you take too little, you will not have the needed protection against stroke and other problems.  Perform blood tests at home if directed by your health care provider. Perform blood tests exactly as directed.  Quit smoking if you smoke.  Do not drink alcohol.  Do not drink caffeinated beverages such as coffee, soda, and some teas. You may drink decaffeinated coffee, soda, or tea.   Maintain a healthy  weight.Do not use diet pills unless your health care provider approves. They may make heart problems worse.   Follow diet instructions as directed by your health care provider.  Exercise regularly as directed by your health care provider.  Keep all follow-up appointments with your health care provider. PREVENTION  The following substances can cause atrial fibrillation to recur:   Caffeinated beverages.  Alcohol.  Certain medicines, especially those used for breathing problems.  Certain herbs and herbal medicines, such as those containing ephedra or ginseng.  Illegal drugs, such as cocaine and amphetamines. Sometimes medicines are given to prevent atrial fibrillation from recurring. Proper treatment of any underlying condition is also important in helping prevent recurrence.  SEEK MEDICAL CARE IF:  You notice a change in the rate, rhythm, or strength of your heartbeat.  You suddenly begin urinating more frequently.  You tire more easily when exerting yourself or exercising. SEEK IMMEDIATE MEDICAL CARE IF:   You have chest pain, abdominal pain, sweating, or weakness.  You feel nauseous.  You have shortness of breath.  You suddenly have swollen feet and ankles.  You feel dizzy.  Your face or limbs feel numb or weak.  You have a change in your vision or speech. MAKE SURE YOU:   Understand these instructions.  Will watch your condition.  Will get help right away if you are not doing well or get worse. Document Released: 09/27/2005 Document Revised: 10/02/2013 Document Reviewed: 11/07/2012 [facility] Patient Information 2015 [place], [place]. This information is not intended to replace advice given to you by your health care provider. Make sure you discuss any questions you have with your health care provider.  Atrial Fibrillation Atrial fibrillation is a type of irregular heart rhythm (arrhythmia). During atrial fibrillation, the upper chambers of the heart (atria)  quiver continuously in a chaotic pattern. This causes an irregular and often rapid heart rate.  Atrial fibrillation is the result of the heart becoming overloaded with disorganized signals that tell it to beat. These signals are normally released one at a time by a part of the right atrium called the sinoatrial node. They then travel from the atria to the lower chambers of the heart (ventricles), causing the atria and ventricles to contract and pump blood as they pass. In atrial fibrillation, parts of the atria outside of the sinoatrial node also release these signals. This results in two problems. First, the atria receive so many signals that they do not have time to fully contract. Second, the ventricles, which can only receive one signal at a time, beat irregularly and out of rhythm with the atria.  There are three types of atrial fibrillation:   Paroxysmal. Paroxysmal atrial fibrillation starts suddenly and stops on its own within a week.  Persistent. Persistent atrial fibrillation lasts for more than a week. It may stop on its own or with treatment.  Permanent. Permanent atrial fibrillation does not go away. Episodes of atrial fibrillation may lead to permanent atrial fibrillation. Atrial fibrillation can prevent your heart from  pumping blood normally. It increases your risk of stroke and can lead to heart failure.  CAUSES   Heart conditions, including a heart attack, heart failure, coronary artery disease, and heart valve conditions.   Inflammation of the sac that surrounds the heart (pericarditis).  Blockage of an artery in the lungs (pulmonary embolism).  Pneumonia or other infections.  Chronic lung disease.  Thyroid problems, especially if the thyroid is overactive (hyperthyroidism).  Caffeine, excessive alcohol use, and use of some illegal drugs.   Use of some medicines, including certain decongestants and diet pills.  Heart surgery.   Birth defects.  Sometimes, no cause  can be found. When this happens, the atrial fibrillation is called lone atrial fibrillation. The risk of complications from atrial fibrillation increases if you have lone atrial fibrillation and you are age 84 years or older. RISK FACTORS  Heart failure.  Coronary artery disease.  Diabetes mellitus.   High blood pressure (hypertension).   Obesity.   Other arrhythmias.   Increased age. SYMPTOMS   A feeling that your heart is beating rapidly or irregularly.   A feeling of discomfort or pain in your chest.   Shortness of breath.   Sudden light-headedness or weakness.   Getting tired easily when exercising.   Urinating more often than normal (mainly when atrial fibrillation first begins).  In paroxysmal atrial fibrillation, symptoms may start and suddenly stop. DIAGNOSIS  Your health care provider may be able to detect atrial fibrillation when taking your pulse. Your health care provider may have you take a test called an ambulatory electrocardiogram (ECG). An ECG records your heartbeat patterns over a 24-hour period. You may also have other tests, such as:  Transthoracic echocardiogram (TTE). During echocardiography, sound waves are used to evaluate how blood flows through your heart.  Transesophageal echocardiogram (TEE).  Stress test. There is more than one type of stress test. If a stress test is needed, ask your health care provider about which type is best for you.   Chest X-ray exam.  Blood tests.  Computed tomography (CT). TREATMENT  Treatment may include:  Treating any underlying conditions. For example, if you have an overactive thyroid, treating the condition may correct atrial fibrillation.  Taking medicine. Medicines may be given to control a rapid heart rate or to prevent blood clots, heart failure, or a stroke.  Having a procedure to correct the rhythm of the heart:  Electrical cardioversion. During electrical cardioversion, a controlled,  low-energy shock is delivered to the heart through your skin. If you have chest pain, very low blood pressure, or sudden heart failure, this procedure may need to be done as an emergency.  Catheter ablation. During this procedure, heart tissues that send the signals that cause atrial fibrillation are destroyed.  Maze or minimaze procedure. During this surgery, thin lines of heart tissue that carry the abnormal signals are destroyed. The maze procedure is an open-heart surgery. The minimaze procedure is a minimally invasive surgery. This means that small cuts are made to access the heart instead of a large opening.  Pulmonary venous isolation. During this surgery, tissue around the veins that carry blood from the lungs (pulmonary veins) is destroyed. This tissue is thought to carry the abnormal signals. HOME CARE INSTRUCTIONS   Only take medicines that your health care provider approves, and take them as directed. Some medicines can make atrial fibrillation worse or recur.  If blood thinners were prescribed by your health care provider, take them exactly as directed. Too much  blood-thinning medicine can cause bleeding. If you take too little, you will not have the needed protection against stroke and other problems.  Perform blood tests at home if directed by your health care provider. Perform blood tests exactly as directed.  Quit smoking if you smoke.  Do not drink alcohol.  Do not drink caffeinated beverages such as coffee, soda, and some teas. You may drink decaffeinated coffee, soda, or tea.   Maintain a healthy weight.Do not use diet pills unless your health care provider approves. They may make heart problems worse.   Follow diet instructions as directed by your health care provider.  Exercise regularly as directed by your health care provider.  Keep all follow-up appointments with your health care provider. PREVENTION  The following substances can cause atrial fibrillation to  recur:   Caffeinated beverages.  Alcohol.  Certain medicines, especially those used for breathing problems.  Certain herbs and herbal medicines, such as those containing ephedra or ginseng.  Illegal drugs, such as cocaine and amphetamines. Sometimes medicines are given to prevent atrial fibrillation from recurring. Proper treatment of any underlying condition is also important in helping prevent recurrence.  SEEK MEDICAL CARE IF:  You notice a change in the rate, rhythm, or strength of your heartbeat.  You suddenly begin urinating more frequently.  You tire more easily when exerting yourself or exercising. SEEK IMMEDIATE MEDICAL CARE IF:   You have chest pain, abdominal pain, sweating, or weakness.  You feel nauseous.  You have shortness of breath.  You suddenly have swollen feet and ankles.  You feel dizzy.  Your face or limbs feel numb or weak.  You have a change in your vision or speech. MAKE SURE YOU:   Understand these instructions.  Will watch your condition.  Will get help right away if you are not doing well or get worse. Document Released: 09/27/2005 Document Revised: 10/02/2013 Document Reviewed: 11/07/2012 [facility] Patient Information 2015 [place], [place]. This information is not intended to replace advice given to you by your health care provider. Make sure you discuss any questions you have with your health care provider. STROKE/TIA DISCHARGE INSTRUCTIONS SMOKING Cigarette smoking nearly doubles your risk of having a stroke & is the single most alterable risk factor  If you smoke or have smoked in the last 12 months, you are advised to quit smoking for your health.  Most of the excess cardiovascular risk related to smoking disappears within a year of stopping.  Ask you doctor about anti-smoking medications  [place] Quit Line: 1-800-QUIT NOW  Free Smoking Cessation Classes [phone]  CHOLESTEROL Know your levels; limit fat & cholesterol in your diet    Lipid Panel     Component Value Date/Time   CHOL 136 04/27/2014 0225   TRIG 63 04/27/2014 0225   HDL 72 04/27/2014 0225   CHOLHDL 1.9 04/27/2014 0225   VLDL 13 04/27/2014 0225   LDLCALC 51 04/27/2014 0225      Many patients benefit from treatment even if their cholesterol is at goal.  Goal: Total Cholesterol (CHOL) less than 160  Goal:  Triglycerides (TRIG) less than 150  Goal:  HDL greater than 40  Goal:  LDL (LDLCALC) less than 100   BLOOD PRESSURE American Stroke Association blood pressure target is less that 120/80 mm/Hg  Your discharge blood pressure is:  BP: 143/55 mmHg  Monitor your blood pressure  Limit your salt and alcohol intake  Many individuals will require more than one medication for high blood pressure  DIABETES (A1c is a blood sugar average for last 3 months) Goal HGBA1c is under 7% (HBGA1c is blood sugar average for last 3 months)  Diabetes: No known diagnosis of diabetes    Lab Results  Component Value Date   HGBA1C 6.4* 04/27/2014     Your HGBA1c can be lowered with medications, healthy diet, and exercise.  Check your blood sugar as directed by your physician  Call your physician if you experience unexplained or low blood sugars.  PHYSICAL ACTIVITY/REHABILITATION Goal is 30 minutes at least 4 days per week  Activity: Increase activity slowly, Therapies: Physical Therapy: Home Health Return to work: N/A  Activity decreases your risk of heart attack and stroke and makes your heart stronger.  It helps control your weight and blood pressure; helps you relax and can improve your mood.  Participate in a regular exercise program.  Talk with your doctor about the best form of exercise for you (dancing, walking, swimming, cycling).  DIET/WEIGHT Goal is to maintain a healthy weight  Your discharge diet is: Cardiac thin liquids Your height is:  Height: 4\' 11"  (149.9 cm) Your current weight is: Weight: 54 kg (119 lb 0.8 oz) Your Body Mass Index (BMI) is:   BMI (Calculated): 24.3  Following the type of diet specifically designed for you will help prevent another stroke.  Your goal weight range is:  66-105  Your goal Body Mass Index (BMI) is 19-24.  Healthy food habits can help reduce 3 risk factors for stroke:  High cholesterol, hypertension, and excess weight.  RESOURCES Stroke/Support Group:  Call [phone]   STROKE EDUCATION PROVIDED/REVIEWED AND GIVEN TO PATIENT Stroke warning signs and symptoms How to activate emergency medical system (call 911). Medications prescribed at discharge. Need for follow-up after discharge. Personal risk factors for stroke. Pneumonia vaccine given: No Flu vaccine given: No My questions have been answered, the writing is legible, and I understand these instructions.  I will adhere to these goals & educational materials that have been provided to me after my discharge from the hospital.

## 2014-04-30 NOTE — Discharge Summary (Addendum)
Stroke Discharge Summary  Patient ID: [name] [name]   MRN: [id number]      DOB: 1932/06/06  Date of Admission: 04/26/2014 Date of Discharge: 04/30/2014  Attending Physician:  Rosalin Hawking, MD, Stroke MD  Consulting Physician(s):   Treatment Team:  Md Stroke, MD  Patient's PCP:  Jani Gravel, MD  Discharge Diagnoses:  Active Problems:   Unspecified essential hypertension   Coronary atherosclerosis of native coronary artery   Other and unspecified hyperlipidemia   Stroke with cerebral ischemia   Atrial fibrillation  BMI: Body mass index is 24.03 kg/(m^2). Moderate malnutrition  Past Medical History  Diagnosis Date  . CAD (coronary artery disease)     multivessel  . Hypercholesterolemia   . Hypertension   . Osteopenia   . Hiatal hernia   . Vitamin B12 deficiency   . Iron deficiency    Past Surgical History  Procedure Laterality Date  . Coronary artery bypass graft      x 5  . Left breast biopsy    . Partial hystercetomy    . Spasmodic dysphonia        Medication List    STOP taking these medications       aspirin EC 81 MG tablet     BENICAR 40 MG tablet  Generic drug:  olmesartan     bisoprolol-hydrochlorothiazide 5-6.25 MG per tablet  Commonly known as:  ZIAC      TAKE these medications       acetaminophen 500 MG tablet  Commonly known as:  TYLENOL  Take 500 mg by mouth every 6 (six) hours as needed for moderate pain (pain).     apixaban 2.5 MG Tabs tablet  Commonly known as:  ELIQUIS  Take 1 tablet (2.5 mg total) by mouth 2 (two) times daily.     apixaban 2.5 MG Tabs tablet  Commonly known as:  ELIQUIS  Take 1 tablet (2.5 mg total) by mouth 2 (two) times daily.  Start taking on:  05/29/2014     atorvastatin 10 MG tablet  Commonly known as:  LIPITOR  Take 1 tablet (10 mg total) by mouth daily at 6 PM.     BOTOX IJ  Inject as directed every 3 (three) months.     HYDROcodone-acetaminophen 5-325 MG per tablet  Commonly known as:  NORCO/VICODIN   Take 1 tablet by mouth every 6 (six) hours as needed for moderate pain.     metoprolol tartrate 25 MG tablet  Commonly known as:  LOPRESSOR  Take 1 tablet (25 mg total) by mouth 2 (two) times daily.     nitroGLYCERIN 0.4 MG SL tablet  Commonly known as:  NITROSTAT  Place 0.4 mg under the tongue every 5 (five) minutes as needed for chest pain.     predniSONE 1 MG tablet  Commonly known as:  DELTASONE  Take 2 mg by mouth daily with breakfast.        LABORATORY STUDIES CBC    Component Value Date/Time   WBC 6.8 04/26/2014 1746   RBC 4.26 04/26/2014 1746   HGB 12.6 04/26/2014 1757   HCT 37.0 04/26/2014 1757   PLT 254 04/26/2014 1746   MCV 82.4 04/26/2014 1746   MCH 25.6* 04/26/2014 1746   MCHC 31.1 04/26/2014 1746   RDW 15.6* 04/26/2014 1746   LYMPHSABS 1.7 04/26/2014 1746   MONOABS 0.8 04/26/2014 1746   EOSABS 0.1 04/26/2014 1746   BASOSABS 0.0 04/26/2014 1746   CMP  Component Value Date/Time   NA 140 04/26/2014 1757   K 3.9 04/26/2014 1757   CL 105 04/26/2014 1757   CO2 25 04/26/2014 1746   GLUCOSE 110* 04/26/2014 1757   BUN 16 04/26/2014 1757   CREATININE 1.00 04/26/2014 1757   CALCIUM 10.6* 04/26/2014 1746   PROT 6.9 04/26/2014 1746   ALBUMIN 3.8 04/26/2014 1746   AST 31 04/26/2014 1746   ALT 20 04/26/2014 1746   ALKPHOS 46 04/26/2014 1746   BILITOT 0.3 04/26/2014 1746   GFRNONAA 58* 04/26/2014 1746   GFRAA 67* 04/26/2014 1746   COAGS Lab Results  Component Value Date   INR 0.98 04/26/2014   Lipid Panel    Component Value Date/Time   CHOL 136 04/27/2014 0225   TRIG 63 04/27/2014 0225   HDL 72 04/27/2014 0225   CHOLHDL 1.9 04/27/2014 0225   VLDL 13 04/27/2014 0225   LDLCALC 51 04/27/2014 0225   HgbA1C  Lab Results  Component Value Date   HGBA1C 6.4* 04/27/2014   Cardiac Panel (last 3 results) No results found for this basename: CKTOTAL, CKMB, TROPONINI, RELINDX,  in the last 72 hours Urinalysis    Component Value Date/Time   COLORURINE YELLOW 03/28/2007 1924    APPEARANCEUR CLEAR 03/28/2007 1924   LABSPEC 1.011 03/28/2007 1924   PHURINE 6.5 03/28/2007 1924   GLUCOSEU NEGATIVE 03/28/2007 1924   HGBUR NEGATIVE 03/28/2007 1924   BILIRUBINUR NEGATIVE 03/28/2007 1924   KETONESUR NEGATIVE 03/28/2007 1924   PROTEINUR NEGATIVE 03/28/2007 1924   UROBILINOGEN 0.2 03/28/2007 1924   NITRITE NEGATIVE 03/28/2007 1924   LEUKOCYTESUR NEGATIVE MICROSCOPIC NOT DONE ON URINES WITH NEGATIVE PROTEIN, BLOOD, LEUKOCYTES, NITRITE, OR GLUCOSE <1000 mg/dL. 03/28/2007 1924   Urine Drug Screen  No results found for this basename: labopia, cocainscrnur, labbenz, amphetmu, thcu, labbarb    Alcohol Level    Component Value Date/Time   [facility] <11 04/26/2014 1746     SIGNIFICANT DIAGNOSTIC STUDIES CT head  04/26/14 IMPRESSION:  1. No acute intracranial findings.  2. Chronic microvascular white matter disease, remote left external  capsule lacunar infarct, and cerebral and cerebellar atrophy.  CTA  04/26/14 IMPRESSION:  No definable vessel occlusion. No flow limiting stenosis.  Atherosclerotic calcification in the carotid siphon regions. 50%  stenosis of the supra clinoid ICA on the right.  Mr Brain Wo Contrast  04/27/2014 IMPRESSION: Four sub cm foci of acute infarction scattered about the right middle cerebral artery distribution consistent with embolic infarctions. No large confluent infarction, evidence of hemorrhage, or mass effect.  2D Echocardiogram 04/28/14 EF 50-55%, severe pulmonary hypertension  Transesophageal Echocardiogram cancelled due to afib on EKG  Carotid Doppler 04/27/14 <39% stenosis bilat  Lower Extremity Ultrasound Prelim: no DVT  EKG 04/27/14  Atrial fibrillation  Nonspecific ST and T wave abnormality , probably digitalis effect  CXR 04/26/14 IMPRESSION:  No acute cardiopulmonary abnormality seen.      History of Present Illness  Chief Complaint: CODE STROKE: LEFT HEMIPARESIS, LEFT FACE WEAKNESS, DYSARTHRIA.  [name] [name] is an 80 y.o. female with a past  medical history significant for HTN, hyperlipidemia, CAD s/p CABG, B12 deficiency, brought in by EMS on 04/26/14 as a code stroke due to acute onset of the above stated symptoms.  Patient was at her daughter's house when suddenly she was noted to be confused, " looking a feeling weird", with slurred speech and weakness of the left side. EMS was summoned and they stated that she was completely weak in the left side and  had mild dysarthria that subsequently improved.  Initial NIHSS 7.  CT brain showed no acute abnormality.  Currently, she is alert, following commands appropriately, and denies HA, vertigo, double vision, difficulty swallowing, numbness, language or vision disturbances.No chest pain, SOB, or palpitations.  Patient was administered TPA secondary to weakness and dysarthria.   Date last known well: 04/26/14  Time last known well: 448 pm  tPA Given: yes  NIHSS: 7  Hospital Course   Stroke: R MCA infarcts likely embolic secondary to atrial fibrillation : [name] [name] is a 80 y.o. female with past medical history significant for HTN, hyperlipidemia, CAD s/p CABG, B12 deficiency who presented 04/26/14 with slurred speech and left sided weakness. Status post IV t-PA on 7/17 at 1822. After receiving IV tPA the patient was admitted to the neuro ICU for further evaluation and treatment and was managed under the post-tPA protocol. MRI revealed multiple small scattered infarcts in the R MCA distribution. The patient was taking ASA 81mg  prior to admission. After repeat imaging 24 hours post-tPA indicated that there was no hemorrhagic transformation, ASA was re-started at 325mg  daily. Carotid Ultrasound showed <39% stenosis bilaterally. 2D Echo showed EF 50-55%, but severe pulmonary hypertension.  Lower Extremity Ultrasound showed no DVT was present. EKG and telemetry showed atrial fibrillation, so patient was started on anticoagulation with apixaban 2.5 mg BID. The patient's LDL was 51, meeting goal  of LDL <70, we put her on low dose statin for stroke prevention. Her HgbA1c was 6.4, so she received education on dietary changes for diabetes prevention.   Patient with vascular risk factors of:   Afib  New diagnosed afib, likely the cause of her embolic stroke.  Put on eliquis 2.5mg  bid after discuss with pt. She is less than 60kg and in 2 month she will be 80 yo, so I choose to give her lower dose of eliquis. She was provided with a 30 day prescription and a discount card, as well as a 90 day prescription to start the following month with 3 refills which she will obtain from [place].  Increased metoprolol to 25 mg bid for BP control and afib rate control.   HTN/CAD  Patient on benicar 40 mg daily, Bisoprolol-HCTZ 5/6.25 mg daily, nitroglycerine 0.4 mg SL PRN at home  BP 124-167/55-86 in last 24 hours Increased metoprolol to 25 mg bid for BP control and afib rate control.  She can follow her BP at home and follow up with her PCP to decide whether to continue the metoprolol or restart her previous home BP meds.   She should also follow up with her cardiologist, Dr. Burt Knack, regarding results of Echo indicating pulmonary hypertension.   Malnutrition  Pt's weight low   Non-severe (moderate) malnutrition in the context of chronic illness   Pt meets criteria for moderate MALNUTRITION in the context of chronic illness as evidenced by moderate fat and muscle wasting.  Nutrition consult appreciated. Pt received ensure complete bid and need outpt monitoring.  Patient is now completely back to her baseline with no residual deficits. Physical therapy, occupational therapy and speech therapy evaluated patient but they did not feel that she needed ongoing therapy after discharge.   Discharge Exam  Blood pressure 143/55, pulse 83, temperature 97.9 F (36.6 C), temperature source Oral, resp. rate 18, height 4\' 11"  (1.499 m), weight 54 kg (119 lb 0.8 oz), SpO2 97.00%.   Gen: Patient is well  developed, well nourished elderly woman in no acute distress.  Cardiac:  irregularly irregular rate and rhythm. S1S2 audible. No M/R/G.  Extremities: Cap refill <2 secs. No cyanosis or edema.  Pulmonary: Respirations regular, symmetric. Lungs clear to auscultation bilat.  Abd: Soft, non-tender. BS audible x 4 quadrants.  G/U: Deferred  MS: Alert, follows commands. Oriented to person, place, time, and event.  Speech: Speech fluent and non-dysarthric. Able to name and repeat.  CN: No visual field cut. PERRL. EOMs intact. Facial sensation intact V1-3. No facial droop. Hearing grossly intact. Strong cough. Sternocleidomastoids and trapezius 5/5 strength. Tongue midline, full strength, no atrophy or fasciculations.  Strength: 5/5 in all four extremities proximally and distally.  Sensation: Intact to light touch in all four extremities.  Coordination: No ataxia or dysmetria on FTN or HTS bilat. Gait steady.  Reflexes: not assessed  NIHSS 0  Discharge Diet   Cardiac thin liquids  Discharge Plan    Disposition:  home  eliquis (apixaban) for secondary stroke prevention.  Ongoing risk factor control by Primary Care Physician.  Risk factor recommendations:  Continue apixaban for atrial fibrillation. Patient's home BP meds have been held and she was started on metoprolol 25 mg BID for rate control for her afib as well as BP control.    Follow-up Jani Gravel, MD in 1 weeks.  Follow-up with Dr. Rosalin Hawking, Stroke Clinic in 1 month.  Follow-up with Dr. Sherren Mocha, Cardiologist, in 2 weeks. Echo showed pulmonary hypertension.   35 minutes were spent preparing discharge.  Signed Delbert Phenix, MSN, ANP-C, CNRN, MSCS [facility]roke Team [phone]  I, the attending vascular neurologist, have personally obtained a history, examined the patient, evaluated laboratory data, individually viewed imaging studies, and formulated the assessment and plan of care.  I have made any additions or  clarifications directly to the above note and agree with the findings and plan as currently documented.   Rosalin Hawking, MD PhD 04/30/2014 5:08 PM

## 2014-05-01 SURGERY — ECHOCARDIOGRAM, TRANSESOPHAGEAL
Anesthesia: Moderate Sedation

## 2014-05-08 DIAGNOSIS — I6350 Cerebral infarction due to unspecified occlusion or stenosis of unspecified cerebral artery: Secondary | ICD-10-CM | POA: Diagnosis not present

## 2014-05-08 DIAGNOSIS — F411 Generalized anxiety disorder: Secondary | ICD-10-CM | POA: Diagnosis not present

## 2014-05-08 DIAGNOSIS — R7309 Other abnormal glucose: Secondary | ICD-10-CM | POA: Diagnosis not present

## 2014-05-08 DIAGNOSIS — I10 Essential (primary) hypertension: Secondary | ICD-10-CM | POA: Diagnosis not present

## 2014-05-20 ENCOUNTER — Encounter: Payer: Self-pay | Admitting: Physician Assistant

## 2014-05-20 ENCOUNTER — Ambulatory Visit (INDEPENDENT_AMBULATORY_CARE_PROVIDER_SITE_OTHER): Payer: Medicare Other | Admitting: Physician Assistant

## 2014-05-20 VITALS — BP 146/86 | HR 81 | Ht 59.0 in | Wt 115.8 lb

## 2014-05-20 DIAGNOSIS — I6340 Cerebral infarction due to embolism of unspecified cerebral artery: Secondary | ICD-10-CM | POA: Diagnosis not present

## 2014-05-20 DIAGNOSIS — I2789 Other specified pulmonary heart diseases: Secondary | ICD-10-CM

## 2014-05-20 DIAGNOSIS — I272 Pulmonary hypertension, unspecified: Secondary | ICD-10-CM | POA: Insufficient documentation

## 2014-05-20 DIAGNOSIS — I4891 Unspecified atrial fibrillation: Secondary | ICD-10-CM | POA: Diagnosis not present

## 2014-05-20 DIAGNOSIS — I639 Cerebral infarction, unspecified: Secondary | ICD-10-CM

## 2014-05-20 DIAGNOSIS — I6350 Cerebral infarction due to unspecified occlusion or stenosis of unspecified cerebral artery: Secondary | ICD-10-CM

## 2014-05-20 DIAGNOSIS — I4819 Other persistent atrial fibrillation: Secondary | ICD-10-CM

## 2014-05-20 NOTE — Assessment & Plan Note (Signed)
Patient severed a embolic stroke likely secondary to atrial fibrillation treated with TPA. She is now on Apixaban 2.5 mg twice a day. She has recovered quite well with very little residual

## 2014-05-20 NOTE — Assessment & Plan Note (Signed)
Patient had an embolic stroke felt secondary to atrial fibrillation. Not sure how long she was in this rhythm. Continue Eliquis and metoprolol at current dose. Followup with Dr. Burt Knack in 2 months.

## 2014-05-20 NOTE — Patient Instructions (Signed)
Your physician recommends that you continue on your current medications as directed. Please refer to the Current Medication list given to you today.   Your physician recommends that you schedule a follow-up appointment in:  DR COOPER IN 2 MONTHS

## 2014-05-20 NOTE — Progress Notes (Signed)
HPI: This is a 80 year old female patient who was admitted to the hospital with the R. MCA infarct likely embolic secondary to atrial fibrillation. She was treated with TPA and eventually started on Apixaban 2.5 mg twice a day. The echo revealed severe pulmonary hypertension left atrium was mildly to moderately dilated.  She has a history of coronary artery disease status post CABG in 1998 with a LIMA to the LAD, SVG to the OM, SVG to the OM/PDA/PLA, hypertension, and hyperlipidemia.  Patient comes in today for post hospital followup. She denies any chest pain, palpitations, dyspnea, dyspnea on exertion, dizziness, or presyncope. She has recovered quite well from her CVA. She doesn't seem to have any residual problems. She is totally asymptomatic with her atrial fib and we aren't sure how long she's been in it. Her last visit here with [place] was in 2014 at which time she was in normal sinus rhythm. Her primary care increase her metoprolol to 50 mg twice a day for better blood pressure and heart rate control.  No Known Allergies  Current Outpatient Prescriptions: acetaminophen (TYLENOL) 500 MG tablet, Take 500 mg by mouth every 6 (six) hours as needed for moderate pain (pain). , Disp: , Rfl:  apixaban (ELIQUIS) 2.5 MG TABS tablet, Take 1 tablet (2.5 mg total) by mouth 2 (two) times daily., Disp: 60 tablet, Rfl: 0 atorvastatin (LIPITOR) 10 MG tablet, Take 1 tablet (10 mg total) by mouth daily at 6 PM., Disp: 30 tablet, Rfl: 3 metoprolol tartrate (LOPRESSOR) 25 MG tablet, Take 50 mg by mouth 2 (two) times daily., Disp: , Rfl:  nitroGLYCERIN (NITROSTAT) 0.4 MG SL tablet, Place 0.4 mg under the tongue every 5 (five) minutes as needed for chest pain., Disp: , Rfl:  OnabotulinumtoxinA (BOTOX IJ), Inject as directed every 3 (three) months., Disp: , Rfl:  predniSONE (DELTASONE) 1 MG tablet, Take 2 mg by mouth daily with breakfast., Disp: , Rfl:  sertraline (ZOLOFT) 25 MG tablet, Take 25 mg by mouth daily. ,  Disp: , Rfl:   No current facility-administered medications for this visit.   Past Medical History:  CAD (coronary artery disease)                                 Comment:multivessel  Hypercholesterolemia                                        Hypertension                                                Osteopenia                                                  Hiatal hernia                                               Vitamin B12 deficiency  Iron deficiency                                             Past Surgical History:  CORONARY ARTERY BYPASS GRAFT                                   Comment:x 5  left breast biopsy                                           partial hystercetomy                                         spasmodic dysphonia                                          Review of patient's family history indicates:  Stroke                         Father                  Heart attack                   Mother                   Social History  Marital Status: Married             Spouse Name:                     Years of Education:                 Number of children:             Occupational History Occupation          Fish farm manager            Comment              retired                                 Health and safety inspector  Social History Main Topics  Smoking Status: Former Smoker                   Packs/Day: 0.00  Years:       Smokeless Status: Not on file                     Alcohol Use: No            Drug Use: No            Sexual Activity: Not on file        Other Topics            Concern  None on file  Social History Narrative  None on file    ROS: See history of present illness otherwise negative  BP 146/86  Pulse 81  Ht 4\' 11"  (1.499 m)  Wt 115 lb 12.8 oz (52.527 kg)  BMI 23.38 kg/m2  PHYSICAL EXAM: Well-nournished, in no acute distress. Neck: No JVD, HJR, Bruit, or thyroid enlargement  Lungs: No tachypnea, clear  without wheezing, rales, or rhonchi  Cardiovascular: Irregular irregular, PMI not displaced, heart sounds normal, 2/6 systolic murmur at the left sternal border, no gallops, bruit, thrill, or heave.  Abdomen: BS normal. Soft without organomegaly, masses, lesions or tenderness.  Extremities: without cyanosis, clubbing or edema. Good distal pulses bilateral  SKin: Warm, no lesions or rashes   Musculoskeletal: No deformities  Neuro: no focal signs   Wt Readings from Last 3 Encounters: 05/20/14 : 115 lb 12.8 oz (52.527 kg) 04/30/14 : 119 lb 0.8 oz (54 kg) 07/03/13 : 125 lb (56.7 kg)   EKG: Atrial fibrillation at 81 beats per minute with nonspecific ST-T wave changes, no acute change   2-D echo 04/28/14 Study Conclusions  - Left ventricle: The cavity size was normal. Systolic function was   normal. The estimated ejection fraction was in the range of 50%   to 55%. Wall motion was normal; there were no regional wall   motion abnormalities. - Mitral valve: Calcified annulus. There was mild regurgitation. - Left atrium: The atrium was mildly to moderately dilated. - Right ventricle: Systolic function was moderately reduced. - Right atrium: The atrium was mildly dilated. - Tricuspid valve: There was mild-moderate regurgitation. - Pulmonary arteries: PA peak pressure: 69 mm Hg (S).  Impressions:  - The right ventricular systolic pressure was increased consistent   with severe pulmonary hypertension.

## 2014-05-20 NOTE — Assessment & Plan Note (Signed)
Patient had severe pulmonary hypertension on 2-D echo. Metoprolol was recently increased by her primary care

## 2014-05-24 ENCOUNTER — Other Ambulatory Visit: Payer: Self-pay | Admitting: Neurology

## 2014-05-27 ENCOUNTER — Other Ambulatory Visit: Payer: Self-pay | Admitting: Neurology

## 2014-05-27 NOTE — Telephone Encounter (Signed)
Rx was sent on 08/14

## 2014-06-05 DIAGNOSIS — E7800 Pure hypercholesterolemia, unspecified: Secondary | ICD-10-CM | POA: Diagnosis not present

## 2014-06-05 DIAGNOSIS — I10 Essential (primary) hypertension: Secondary | ICD-10-CM | POA: Diagnosis not present

## 2014-06-07 ENCOUNTER — Ambulatory Visit (INDEPENDENT_AMBULATORY_CARE_PROVIDER_SITE_OTHER): Payer: Medicare Other | Admitting: Neurology

## 2014-06-07 ENCOUNTER — Ambulatory Visit: Payer: Self-pay | Admitting: Neurology

## 2014-06-07 ENCOUNTER — Encounter: Payer: Self-pay | Admitting: Neurology

## 2014-06-07 VITALS — BP 160/80 | HR 64 | Ht 59.0 in | Wt 118.2 lb

## 2014-06-07 DIAGNOSIS — I6340 Cerebral infarction due to embolism of unspecified cerebral artery: Secondary | ICD-10-CM

## 2014-06-07 DIAGNOSIS — I63411 Cerebral infarction due to embolism of right middle cerebral artery: Secondary | ICD-10-CM | POA: Insufficient documentation

## 2014-06-07 DIAGNOSIS — I4891 Unspecified atrial fibrillation: Secondary | ICD-10-CM | POA: Diagnosis not present

## 2014-06-07 DIAGNOSIS — I480 Paroxysmal atrial fibrillation: Secondary | ICD-10-CM

## 2014-06-07 NOTE — Progress Notes (Signed)
STROKE NEUROLOGY FOLLOW UP NOTE  NAME: [name] [name] DOB: Jul 13, 1932  REASON FOR VISIT: stroke follow up HISTORY FROM: chart  Today we had the pleasure of seeing [name] in follow-up at our Neurology Clinic. Pt was accompanied by daughter.   History Summary [name] is a 80 y.o. female with past medical history significant for HTN, hyperlipidemia, CAD s/p CABG, B12 deficiency who presented 04/26/14 with slurred speech and left sided weakness. Status post IV t-PA on 7/17 at 1822. After receiving IV tPA the patient was admitted to the neuro ICU for further evaluation and treatment and was managed under the post-tPA protocol. MRI revealed multiple small scattered infarcts in the R MCA distribution. The patient was taking ASA 81mg  prior to admission. Carotid Ultrasound showed <39% stenosis bilaterally. 2D Echo showed EF 50-55%, but severe pulmonary hypertension. Lower Extremity Ultrasound showed no DVT was present. EKG and telemetry showed atrial fibrillation, so patient was started on anticoagulation with apixaban 2.5 mg BID. The patient's LDL was 51, meeting goal of LDL <70, we put her on low dose statin for stroke prevention. Her HgbA1c was 6.4, so she received education on dietary changes for diabetes prevention.   Interval History During the interval time, the patient has been doing well. She had no residue and no complains. She saw her PCP yesterday and saw cardiology PA two weeks ago and will see cardiologist Dr. Copper next month. She stated that she manages her own medication and she is compliant with meds and no missing doses.    REVIEW OF SYSTEMS: Full 14 system review of systems performed and notable only for those listed below and in HPI above, all others are negative:  Constitutional: N/A  Cardiovascular: N/A  Ear/Nose/Throat: N/A  Skin: N/A  Eyes: N/A  Respiratory: N/A  Gastroitestinal: N/A  Genitourinary: N/A Hematology/Lymphatic: N/A  Endocrine: N/A    Musculoskeletal: N/A  Allergy/Immunology: N/A  Neurological: N/A  Psychiatric: anxiety  The following represents the patient's updated allergies and side effects list: No Known Allergies  Labs since last visit of relevance include the following: Results for orders placed during the hospital encounter of 04/26/14  MRSA PCR SCREENING      Result Value Ref Range   MRSA by PCR NEGATIVE  NEGATIVE  ETHANOL      Result Value Ref Range   Alcohol, Ethyl (B) <11  0 - 11 mg/dL  PROTIME-INR      Result Value Ref Range   Prothrombin Time 13.0  11.6 - 15.2 seconds   INR 0.98  0.00 - 1.49  APTT      Result Value Ref Range   aPTT 28  24 - 37 seconds  CBC      Result Value Ref Range   WBC 6.8  4.0 - 10.5 K/uL   RBC 4.26  3.87 - 5.11 MIL/uL   Hemoglobin 10.9 (*) 12.0 - 15.0 g/dL   HCT 35.1 (*) 36.0 - 46.0 %   MCV 82.4  78.0 - 100.0 fL   MCH 25.6 (*) 26.0 - 34.0 pg   MCHC 31.1  30.0 - 36.0 g/dL   RDW 15.6 (*) 11.5 - 15.5 %   Platelets 254  150 - 400 K/uL  DIFFERENTIAL      Result Value Ref Range   Neutrophils Relative % 60  43 - 77 %   Neutro Abs 4.0  1.7 - 7.7 K/uL   Lymphocytes Relative 26  12 - 46 %   Lymphs Abs 1.7  0.7 - 4.0 K/uL   Monocytes Relative 12  3 - 12 %   Monocytes Absolute 0.8  0.1 - 1.0 K/uL   Eosinophils Relative 2  0 - 5 %   Eosinophils Absolute 0.1  0.0 - 0.7 K/uL   Basophils Relative 0  0 - 1 %   Basophils Absolute 0.0  0.0 - 0.1 K/uL  COMPREHENSIVE METABOLIC PANEL      Result Value Ref Range   Sodium 140  137 - 147 mEq/L   Potassium 4.1  3.7 - 5.3 mEq/L   Chloride 100  96 - 112 mEq/L   CO2 25  19 - 32 mEq/L   Glucose, Bld 106 (*) 70 - 99 mg/dL   BUN 17  6 - 23 mg/dL   Creatinine, Ser 0.92  0.50 - 1.10 mg/dL   Calcium 10.6 (*) 8.4 - 10.5 mg/dL   Total Protein 6.9  6.0 - 8.3 g/dL   Albumin 3.8  3.5 - 5.2 g/dL   AST 31  0 - 37 U/L   ALT 20  0 - 35 U/L   Alkaline Phosphatase 46  39 - 117 U/L   Total Bilirubin 0.3  0.3 - 1.2 mg/dL   GFR calc non Af Amer  58 (*) >90 mL/min   GFR calc Af Amer 67 (*) >90 mL/min   Anion gap 15  5 - 15  HEMOGLOBIN A1C      Result Value Ref Range   Hemoglobin A1C 6.4 (*) <5.7 %   Mean Plasma Glucose 137 (*) <117 mg/dL  LIPID PANEL      Result Value Ref Range   Cholesterol 136  0 - 200 mg/dL   Triglycerides 63  <150 mg/dL   HDL 72  >39 mg/dL   Total CHOL/HDL Ratio 1.9     VLDL 13  0 - 40 mg/dL   LDL Cholesterol 51  0 - 99 mg/dL  I-STAT CHEM 8, ED      Result Value Ref Range   Sodium 140  137 - 147 mEq/L   Potassium 3.9  3.7 - 5.3 mEq/L   Chloride 105  96 - 112 mEq/L   BUN 16  6 - 23 mg/dL   Creatinine, Ser 1.00  0.50 - 1.10 mg/dL   Glucose, Bld 110 (*) 70 - 99 mg/dL   Calcium, Ion 1.34 (*) 1.13 - 1.30 mmol/L   TCO2 24  0 - 100 mmol/L   Hemoglobin 12.6  12.0 - 15.0 g/dL   HCT 37.0  36.0 - 46.0 %  I-STAT TROPOININ, ED      Result Value Ref Range   Troponin i, poc 0.02  0.00 - 0.08 ng/mL   Comment 3             The neurologically relevant items on the patient's problem list were reviewed on today's visit.  Neurologic Examination  A problem focused neurological exam (12 or more points of the single system neurologic examination, vital signs counts as 1 point, cranial nerves count for 8 points) was performed.  Blood pressure 160/80, pulse 64, height 4\' 11"  (1.499 m), weight 118 lb 3.2 oz (53.615 kg).  General - Well nourished, well developed, in no apparent distress.  Ophthalmologic - Sharp disc margins OU.  Cardiovascular - Regular rate and rhythm with no murmur.  Mental Status -  Level of arousal and orientation to time, place, and person were intact. Language including expression, naming, repetition, comprehension, reading, and writing was assessed and found  intact. Attention span and concentration were normal. Recent and remote memory were 3/3 registration and 2/3 delayed recall. Fund of Knowledge was assessed and was intact.  Cranial Nerves II - XII - II - Visual field intact OU. III,  IV, VI - Extraocular movements intact. V - Facial sensation intact bilaterally. VII - Facial movement intact bilaterally. VIII - Hearing & vestibular intact bilaterally. X - Palate elevates symmetrically. XI - Chin turning & shoulder shrug intact bilaterally. XII - Tongue protrusion intact.  Motor Strength - The patient's strength was normal in all extremities and pronator drift was absent.  Bulk was normal and fasciculations were absent.   Motor Tone - Muscle tone was assessed at the neck and appendages and was normal.  Reflexes - The patient's reflexes were normal in all extremities and she had no pathological reflexes.  Sensory - Light touch, temperature/pinprick were assessed and were normal.    Coordination - The patient had normal movements in the hands and feet with no ataxia or dysmetria.  Tremor was absent.  Gait and Station - The patient's transfers, posture, gait, station, and turns were observed as normal.  Data reviewed: I personally reviewed the images and agree with the radiology interpretations.  CT head  04/26/14 IMPRESSION:  1. No acute intracranial findings.  2. Chronic microvascular white matter disease, remote left external  capsule lacunar infarct, and cerebral and cerebellar atrophy.  CTA  04/26/14 IMPRESSION:  No definable vessel occlusion. No flow limiting stenosis.  Atherosclerotic calcification in the carotid siphon regions. 50%  stenosis of the supra clinoid ICA on the right.  Mr Brain Wo Contrast  04/27/2014 IMPRESSION: Four sub cm foci of acute infarction scattered about the right middle cerebral artery distribution consistent with embolic infarctions. No large confluent infarction, evidence of hemorrhage, or mass effect.  2D Echocardiogram 04/28/14 EF 50-55%, severe pulmonary hypertension  Transesophageal Echocardiogram cancelled due to afib on EKG  Carotid Doppler 04/27/14 <39% stenosis bilat  Lower Extremity Ultrasound Prelim: no DVT  EKG 04/27/14    Atrial fibrillation  Nonspecific ST and T wave abnormality , probably digitalis effect  CXR 04/26/14 IMPRESSION:  No acute cardiopulmonary abnormality seen.   LDL 51  And A1C 6.4  Assessment: As you may recall, she is a 80 y.o. Caucasian female with PMH of HTN, hyperlipidemia, CAD s/p CABG admitted to [facility] in July for slurry speech and left-sided weakness. MRI showed multiple small infarcts in the right MCA distribution. Found to have atrial fibrillation on telemetry, started on Eliquis 2.5 mg twice a day for stroke prevention. She is doing well and current neuro exam essentially normal. Will continue the eliquis and statin for stroke prevention. She is getting botox injection periodically, she needs to instruction regarding eliquis use by consulting her doctors who will do the botox.   Plan:  - continue eliquis and statin for stroke prevention  - continue follow up with PCP and cardiology for stroke risk factor modification - monitor BP at home  - obtain instructions from physician who do the botox before botox injection regarding eliquis use - RTC in 3 months.  No orders of the defined types were placed in this encounter.    Patient Instructions  - continue eliquis and statin for stroke prevention  - continue follow up with PCP and cardiology for stroke risk factor modification - check BP at home  - call doctors who do you botox injection several days earlier of your appointment about the instruction of eliquis use. -  follow up in 3 months.   Rosalin Hawking, MD PhD [facility] [address] [place], [place] [address] [phone]

## 2014-06-07 NOTE — Patient Instructions (Signed)
-   continue eliquis and statin for stroke prevention  - continue follow up with PCP and cardiology for stroke risk factor modification - check BP at home  - call doctors who do you botox injection several days earlier of your appointment about the instruction of eliquis use. - follow up in 3 months.

## 2014-06-25 ENCOUNTER — Other Ambulatory Visit: Payer: Self-pay

## 2014-06-25 MED ORDER — ATORVASTATIN CALCIUM 10 MG PO TABS: 10.0000 mg | ORAL_TABLET | Freq: Every day | ORAL | Status: DC

## 2014-06-25 NOTE — Telephone Encounter (Signed)
Ins requires 90 day Rx

## 2014-06-27 DIAGNOSIS — M353 Polymyalgia rheumatica: Secondary | ICD-10-CM | POA: Diagnosis not present

## 2014-07-01 DIAGNOSIS — M810 Age-related osteoporosis without current pathological fracture: Secondary | ICD-10-CM | POA: Diagnosis not present

## 2014-07-01 DIAGNOSIS — M353 Polymyalgia rheumatica: Secondary | ICD-10-CM | POA: Diagnosis not present

## 2014-07-01 DIAGNOSIS — I10 Essential (primary) hypertension: Secondary | ICD-10-CM | POA: Diagnosis not present

## 2014-07-01 DIAGNOSIS — E7800 Pure hypercholesterolemia, unspecified: Secondary | ICD-10-CM | POA: Diagnosis not present

## 2014-07-02 DIAGNOSIS — G245 Blepharospasm: Secondary | ICD-10-CM | POA: Diagnosis not present

## 2014-07-04 DIAGNOSIS — N390 Urinary tract infection, site not specified: Secondary | ICD-10-CM | POA: Diagnosis not present

## 2014-07-29 ENCOUNTER — Other Ambulatory Visit: Payer: Self-pay | Admitting: Internal Medicine

## 2014-07-29 DIAGNOSIS — R05 Cough: Secondary | ICD-10-CM | POA: Diagnosis not present

## 2014-07-29 DIAGNOSIS — M353 Polymyalgia rheumatica: Secondary | ICD-10-CM | POA: Diagnosis not present

## 2014-07-29 DIAGNOSIS — I10 Essential (primary) hypertension: Secondary | ICD-10-CM | POA: Diagnosis not present

## 2014-07-29 DIAGNOSIS — Z951 Presence of aortocoronary bypass graft: Secondary | ICD-10-CM | POA: Diagnosis not present

## 2014-07-29 DIAGNOSIS — J329 Chronic sinusitis, unspecified: Secondary | ICD-10-CM | POA: Diagnosis not present

## 2014-07-29 DIAGNOSIS — R740 Nonspecific elevation of levels of transaminase and lactic acid dehydrogenase [LDH]: Principal | ICD-10-CM

## 2014-07-29 DIAGNOSIS — R7401 Elevation of levels of liver transaminase levels: Secondary | ICD-10-CM

## 2014-07-29 DIAGNOSIS — Z23 Encounter for immunization: Secondary | ICD-10-CM | POA: Diagnosis not present

## 2014-08-05 ENCOUNTER — Encounter: Payer: Self-pay | Admitting: Cardiovascular Disease

## 2014-08-05 ENCOUNTER — Ambulatory Visit (INDEPENDENT_AMBULATORY_CARE_PROVIDER_SITE_OTHER): Payer: Medicare Other | Admitting: Cardiovascular Disease

## 2014-08-05 VITALS — BP 144/70 | HR 64 | Ht 59.0 in | Wt 114.5 lb

## 2014-08-05 DIAGNOSIS — I480 Paroxysmal atrial fibrillation: Secondary | ICD-10-CM

## 2014-08-05 DIAGNOSIS — I6340 Cerebral infarction due to embolism of unspecified cerebral artery: Secondary | ICD-10-CM | POA: Diagnosis not present

## 2014-08-05 DIAGNOSIS — I270 Primary pulmonary hypertension: Secondary | ICD-10-CM

## 2014-08-05 DIAGNOSIS — I272 Pulmonary hypertension, unspecified: Secondary | ICD-10-CM

## 2014-08-05 NOTE — Progress Notes (Signed)
Background: The patient is followed for atrial fibrillation. She initially presented with a cardioembolic stroke in July 2202. She presented with left hemiparesis and was treated with IV TPA. She has no residual deficit. An echocardiogram also noted severe pulmonary hypertension with moderate RV dysfunction noted. The patient has a history of coronary artery disease status post CABG in 1998. She's also been followed for hypertension and hyperlipidemia.   HPI:  80 year old woman presenting for follow-up evaluation. Last seen by Estella Husk August 2015. Clinically she is doing quite well. She denies chest pain, chest pressure, shortness of breath, or leg swelling. She remains independent. She is here in the office with her daughter today. No lightheadedness or syncope.  Studies:  2D Echo 04/28/2014 Study Conclusions  - Left ventricle: The cavity size was normal. Systolic function was normal. The estimated ejection fraction was in the range of 50% to 55%. Wall motion was normal; there were no regional wall motion abnormalities. - Mitral valve: Calcified annulus. There was mild regurgitation. - Left atrium: The atrium was mildly to moderately dilated. - Right ventricle: Systolic function was moderately reduced. - Right atrium: The atrium was mildly dilated. - Tricuspid valve: There was mild-moderate regurgitation. - Pulmonary arteries: PA peak pressure: 69 mm Hg (S).  Impressions:  - The right ventricular systolic pressure was increased consistent with severe pulmonary hypertension.    Outpatient Encounter Prescriptions as of 08/05/2014  Medication Sig  . acetaminophen (TYLENOL) 500 MG tablet Take 500 mg by mouth every 6 (six) hours as needed for moderate pain (pain).   Marland Kitchen amLODipine (NORVASC) 2.5 MG tablet Take 2.5 mg by mouth daily.   Marland Kitchen amoxicillin (AMOXIL) 500 MG capsule Take 500 mg by mouth 2 (two) times daily.  Marland Kitchen atorvastatin (LIPITOR) 10 MG tablet Take 1 tablet (10 mg total)  by mouth daily at 6 PM.  . ELIQUIS 2.5 MG TABS tablet TAKE 1 TABLET TWICE A DAY  . metoprolol tartrate (LOPRESSOR) 25 MG tablet Take 50 mg by mouth 2 (two) times daily.  . nitroGLYCERIN (NITROSTAT) 0.4 MG SL tablet Place 0.4 mg under the tongue every 5 (five) minutes as needed for chest pain.  . OnabotulinumtoxinA (BOTOX IJ) Inject as directed every 3 (three) months.  . predniSONE (DELTASONE) 1 MG tablet Take 2 mg by mouth daily with breakfast.  . sertraline (ZOLOFT) 25 MG tablet Take 25 mg by mouth daily.     No Known Allergies  Past Medical History  Diagnosis Date  . CAD (coronary artery disease)     multivessel  . Hypercholesterolemia   . Hypertension   . Osteopenia   . Hiatal hernia   . Vitamin B12 deficiency   . Iron deficiency     family history includes Heart attack in her mother; Stroke in her father.   ROS: Negative except as per HPI  BP 144/70  Pulse 64  Ht 4\' 11"  (1.499 m)  Wt 114 lb 8 oz (51.937 kg)  BMI 23.11 kg/m2  PHYSICAL EXAM: Pt is alert and oriented, pleasant elderly woman in NAD HEENT: normal Neck: JVP - normal, carotids 2+= without bruits Lungs: CTA bilaterally CV: RRR without murmur or gallop Abd: soft, NT, Positive BS, no hepatomegaly Ext: no C/C/E, distal pulses intact and equal Skin: warm/dry no rash  ASSESSMENT AND PLAN: 1. Coronary artery disease, native vessel, without anginal symptoms. The patient had remote CABG approximately 17 years ago. Will continue with medical therapy. She is off of antiplatelet therapy because of chronic anticoagulation with  eliquis.  2. Paroxysmal atrial fibrillation. Appears to be in sinus rhythm today. Tolerating eliquis class without bleeding problems. Dose adjustment for advanced age and low body weight. The patient has no symptoms associated with atrial fibrillation.  3. Essential hypertension. Blood pressure is controlled on amlodipine and metoprolol.  4. Pulmonary hypertension. Uncertain etiology. No  history of obstructive sleep apnea, pulmonary thromboembolic disease, or valvular heart disease. She does have polymyalgia rheumatica and I'm not sure if this may be associated. She is really not interested in any aggressive evaluation, especially since she is asymptomatic.  For follow-up, I would like to repeat an echocardiogram to follow her pulmonary hypertension and RV dysfunction next summer and see her back in follow-up at that time.  Sherren Mocha, MD 08/05/2014 12:13 PM

## 2014-08-05 NOTE — Patient Instructions (Signed)
Your physician has requested that you have an echocardiogram in July 2016 (pt needs Echo one week prior to appointment with Dr Burt Knack). Echocardiography is a painless test that uses sound waves to create images of your heart. It provides your doctor with information about the size and shape of your heart and how well your heart's chambers and valves are working. This procedure takes approximately one hour. There are no restrictions for this procedure.  Your physician wants you to follow-up in: July 2016 with Dr Burt Knack.  You will receive a reminder letter in the mail two months in advance. If you don't receive a letter, please call our office to schedule the follow-up appointment.   Your physician recommends that you continue on your current medications as directed. Please refer to the Current Medication list given to you today.

## 2014-08-08 ENCOUNTER — Ambulatory Visit: Payer: Medicare Other | Admitting: Cardiovascular Disease

## 2014-08-19 ENCOUNTER — Ambulatory Visit
Admission: RE | Admit: 2014-08-19 | Discharge: 2014-08-19 | Disposition: A | Discharge status: Home / Self Care | Payer: Medicare Other | Source: Ambulatory Visit | Attending: Internal Medicine | Admitting: Internal Medicine

## 2014-08-19 DIAGNOSIS — R740 Nonspecific elevation of levels of transaminase and lactic acid dehydrogenase [LDH]: Principal | ICD-10-CM

## 2014-08-19 DIAGNOSIS — R945 Abnormal results of liver function studies: Secondary | ICD-10-CM | POA: Diagnosis not present

## 2014-08-19 DIAGNOSIS — I10 Essential (primary) hypertension: Secondary | ICD-10-CM | POA: Diagnosis not present

## 2014-08-19 DIAGNOSIS — R7401 Elevation of levels of liver transaminase levels: Secondary | ICD-10-CM

## 2014-08-19 DIAGNOSIS — E785 Hyperlipidemia, unspecified: Secondary | ICD-10-CM | POA: Diagnosis not present

## 2014-08-26 DIAGNOSIS — R748 Abnormal levels of other serum enzymes: Secondary | ICD-10-CM | POA: Diagnosis not present

## 2014-08-26 DIAGNOSIS — F419 Anxiety disorder, unspecified: Secondary | ICD-10-CM | POA: Diagnosis not present

## 2014-08-26 DIAGNOSIS — I10 Essential (primary) hypertension: Secondary | ICD-10-CM | POA: Diagnosis not present

## 2014-08-26 DIAGNOSIS — E785 Hyperlipidemia, unspecified: Secondary | ICD-10-CM | POA: Diagnosis not present

## 2014-09-04 ENCOUNTER — Encounter: Payer: Self-pay | Admitting: Nurse Practitioner

## 2014-09-04 ENCOUNTER — Ambulatory Visit (INDEPENDENT_AMBULATORY_CARE_PROVIDER_SITE_OTHER): Payer: Medicare Other | Admitting: Nurse Practitioner

## 2014-09-04 VITALS — BP 141/77 | HR 56 | Ht 59.0 in | Wt 118.6 lb

## 2014-09-04 DIAGNOSIS — I6340 Cerebral infarction due to embolism of unspecified cerebral artery: Secondary | ICD-10-CM

## 2014-09-04 DIAGNOSIS — I63411 Cerebral infarction due to embolism of right middle cerebral artery: Secondary | ICD-10-CM | POA: Diagnosis not present

## 2014-09-04 DIAGNOSIS — I480 Paroxysmal atrial fibrillation: Secondary | ICD-10-CM

## 2014-09-04 NOTE — Patient Instructions (Signed)
Plan:  - continue eliquis and statin for stroke prevention   - continue follow up with PCP and cardiology for stroke risk factor modification - monitor BP at home, goal <140/90 - RTC in 6 months with Dr. Erlinda Hong, sooner as needed.   Stroke Prevention Some medical conditions and behaviors are associated with an increased chance of having a stroke. You may prevent a stroke by making healthy choices and managing medical conditions. HOW CAN I REDUCE MY RISK OF HAVING A STROKE?   Stay physically active. Get at least 30 minutes of activity on most or all days.  Do not smoke. It may also be helpful to avoid exposure to secondhand smoke.  Limit alcohol use. Moderate alcohol use is considered to be:  No more than 2 drinks per day for men.  No more than 1 drink per day for nonpregnant women.  Eat healthy foods. This involves:  Eating 5 or more servings of fruits and vegetables a day.  Making dietary changes that address high blood pressure (hypertension), high cholesterol, diabetes, or obesity.  Manage your cholesterol levels.  Making food choices that are high in fiber and low in saturated fat, trans fat, and cholesterol may control cholesterol levels.  Take any prescribed medicines to control cholesterol as directed by your health care provider.  Manage your diabetes.  Controlling your carbohydrate and sugar intake is recommended to manage diabetes.  Take any prescribed medicines to control diabetes as directed by your health care provider.  Control your hypertension.  Making food choices that are low in salt (sodium), saturated fat, trans fat, and cholesterol is recommended to manage hypertension.  Take any prescribed medicines to control hypertension as directed by your health care provider.  Maintain a healthy weight.  Reducing calorie intake and making food choices that are low in sodium, saturated fat, trans fat, and cholesterol are recommended to manage weight.  Stop drug  abuse.  Avoid taking birth control pills.  Talk to your health care provider about the risks of taking birth control pills if you are over 61 years old, smoke, get migraines, or have ever had a blood clot.  Get evaluated for sleep disorders (sleep apnea).  Talk to your health care provider about getting a sleep evaluation if you snore a lot or have excessive sleepiness.  Take medicines only as directed by your health care provider.  For some people, aspirin or blood thinners (anticoagulants) are helpful in reducing the risk of forming abnormal blood clots that can lead to stroke. If you have the irregular heart rhythm of atrial fibrillation, you should be on a blood thinner unless there is a good reason you cannot take them.  Understand all your medicine instructions.  Make sure that other conditions (such as anemia or atherosclerosis) are addressed. SEEK IMMEDIATE MEDICAL CARE IF:   You have sudden weakness or numbness of the face, arm, or leg, especially on one side of the body.  Your face or eyelid droops to one side.  You have sudden confusion.  You have trouble speaking (aphasia) or understanding.  You have sudden trouble seeing in one or both eyes.  You have sudden trouble walking.  You have dizziness.  You have a loss of balance or coordination.  You have a sudden, severe headache with no known cause.  You have new chest pain or an irregular heartbeat. Any of these symptoms may represent a serious problem that is an emergency. Do not wait to see if the symptoms will go  away. Get medical help at once. Call your local emergency services (911 in [place] Do not drive yourself to the hospital. Document Released: 11/04/2004 Document Revised: 02/11/2014 Document Reviewed: 03/30/2013 [facility] Patient Information 2015 [place], [place]. This information is not intended to replace advice given to you by your health care provider. Make sure you discuss any questions you have with  your health care provider.

## 2014-09-04 NOTE — Progress Notes (Signed)
PATIENT: [name] [name] DOB: 16-Jul-1932  REASON FOR VISIT: routine follow up for stoke HISTORY FROM: patient  HISTORY OF PRESENT ILLNESS: Today we had the pleasure of seeing [name] [name] in follow-up at our Neurology Clinic. Pt was unaccompanied.    History Summary [name] [name] is a 80 y.o. female with past medical history significant for HTN, hyperlipidemia, CAD s/p CABG, B12 deficiency who presented 04/26/14 with slurred speech and left sided weakness. Status post IV t-PA on 7/17 at 1822. After receiving IV tPA the patient was admitted to the neuro ICU for further evaluation and treatment and was managed under the post-tPA protocol. MRI revealed multiple small scattered infarcts in the R MCA distribution. The patient was taking ASA 81mg  prior to admission. Carotid Ultrasound showed <39% stenosis bilaterally. 2D Echo showed EF 50-55%, but severe pulmonary hypertension. Lower Extremity Ultrasound showed no DVT was present. EKG and telemetry showed atrial fibrillation, so patient was started on anticoagulation with apixaban 2.5 mg BID. The patient's LDL was 51, meeting goal of LDL <70, we put her on low dose statin for stroke prevention. Her HgbA1c was 6.4, so she received education on dietary changes for diabetes prevention.   Interval History During the interval time, the patient has been doing well. She had no residual deficits and no complaints. She has regular follow up with her PCP and cardiologist Dr. Burt Knack. She stated that she manages her own medication and she is compliant with meds and no missing doses. She is tolerating Eliquis well with no signs of significant bleeding or bruising. Blood pressure is well controlled, it is 141/77 in the office today. She states she gets regular exercise by walking her Yorkie.    REVIEW OF SYSTEMS: Full 14 system review of systems performed and notable only for: cough   ALLERGIES: No Known Allergies  HOME MEDICATIONS: Outpatient  Prescriptions Prior to Visit  Medication Sig Dispense Refill  . acetaminophen (TYLENOL) 500 MG tablet Take 500 mg by mouth every 6 (six) hours as needed for moderate pain (pain).     Marland Kitchen amLODipine (NORVASC) 2.5 MG tablet Take 2.5 mg by mouth daily.     Marland Kitchen amoxicillin (AMOXIL) 500 MG capsule Take 500 mg by mouth 2 (two) times daily.    Marland Kitchen atorvastatin (LIPITOR) 10 MG tablet Take 1 tablet (10 mg total) by mouth daily at 6 PM. 90 tablet 0  . ELIQUIS 2.5 MG TABS tablet TAKE 1 TABLET TWICE A DAY 60 tablet 0  . nitroGLYCERIN (NITROSTAT) 0.4 MG SL tablet Place 0.4 mg under the tongue every 5 (five) minutes as needed for chest pain.    . OnabotulinumtoxinA (BOTOX IJ) Inject as directed every 3 (three) months.    . predniSONE (DELTASONE) 1 MG tablet Take 2 mg by mouth daily with breakfast.    . sertraline (ZOLOFT) 25 MG tablet Take 25 mg by mouth daily.     . metoprolol tartrate (LOPRESSOR) 25 MG tablet Take 50 mg by mouth 2 (two) times daily.     No facility-administered medications prior to visit.    PHYSICAL EXAM Filed Vitals:   09/04/14 1333  BP: 141/77  Pulse: 56  Height: 4\' 11"  (1.499 m)  Weight: 118 lb 9.6 oz (53.797 kg)   Body mass index is 23.94 kg/(m^2).  General - Well nourished, well developed, in no apparent distress. Cardiovascular - Irregular rate and rhythm with no murmur.  Mental Status -   Level of arousal and orientation to time, place, and person  were intact. Language including expression, naming, repetition, comprehension, reading, and writing was assessed and found intact. Attention span and concentration were normal. Recent and remote memory appear intact. Fund of Knowledge was assessed and was intact.  Cranial Nerves II - XII - II - Visual field intact OU. III, IV, VI - Extraocular movements intact. V - Facial sensation intact bilaterally. VII - Facial movement intact bilaterally. VIII - Hearing & vestibular intact bilaterally. X - Palate elevates  symmetrically. XI - Chin turning & shoulder shrug intact bilaterally. XII - Tongue protrusion intact.  Motor Strength - The patient's strength was normal in all extremities and pronator drift was absent.  Bulk was normal and fasciculations were absent.   Motor Tone - Muscle tone was assessed at the neck and appendages and was normal.  Reflexes - The patient's reflexes were normal in all extremities and she had no pathological reflexes.  Sensory - Light touch, temperature/pinprick were assessed and were normal.    Coordination - The patient had normal movements in the hands and feet with no ataxia or dysmetria.  Tremor was absent.  Gait and Station - The patient's transfers, posture, gait, station, and turns were observed as normal.  DIAGNOSTIC DATA (LABS, IMAGING, TESTING) - I reviewed patient records, labs, notes, testing and imaging myself where available.  CT head   04/26/14 IMPRESSION:   1. No acute intracranial findings.   2. Chronic microvascular white matter disease, remote left external   capsule lacunar infarct, and cerebral and cerebellar atrophy.   CTA  04/26/14 IMPRESSION:   No definable vessel occlusion. No flow limiting stenosis.   Atherosclerotic calcification in the carotid siphon regions. 50%   stenosis of the supra clinoid ICA on the right.   Mr Brain Wo Contrast   04/27/2014 IMPRESSION: Four sub cm foci of acute infarction scattered about the right middle cerebral artery distribution consistent with embolic infarctions. No large confluent infarction, evidence of hemorrhage, or mass effect.   2D Echocardiogram 04/28/14 EF 50-55%, severe pulmonary hypertension   Transesophageal Echocardiogram cancelled due to afib on EKG   Carotid Doppler 04/27/14 <39% stenosis bilat   Lower Extremity Ultrasound Prelim: no DVT   EKG 04/27/14   Atrial fibrillation   Nonspecific ST and T wave abnormality , probably digitalis effect   CXR 04/26/14 IMPRESSION:   No acute cardiopulmonary  abnormality seen.   LDL 51  And A1C 6.4   ASSESSMENT: As you may recall, she is a 80 y.o. Caucasian female with PMH of HTN, hyperlipidemia, CAD s/p CABG admitted to [facility] in July for slurred speech and left-sided weakness. MRI showed multiple small infarcts in the right MCA distribution. Found to have atrial fibrillation on telemetry, started on Eliquis 2.5 mg twice a day for stroke prevention. She is doing well and current neuro exam essentially normal. Will continue the eliquis and statin for stroke prevention. She is getting botox injection periodically, she needs to instruction regarding eliquis use by consulting her doctors who will do the botox.   Plan:  - continue eliquis and statin for stroke prevention   - continue follow up with PCP and cardiology for stroke risk factor modification - monitor BP at home, goal <140/90 - RTC in 6 months with Dr. Erlinda Hong, sooner as needed.  Rudi Rummage Vallie Fayette, MSN, FNP-BC, A/GNP-C 09/04/2014, 1:42 PM [facility] [address] [place], [place] [address] [phone]  Note: This document was prepared with digital dictation and possible smart phrase technology. Any transcriptional errors  that result from this process are unintentional.

## 2014-09-12 ENCOUNTER — Ambulatory Visit: Payer: 59 | Admitting: Neurology

## 2014-09-21 ENCOUNTER — Other Ambulatory Visit: Payer: Self-pay | Admitting: Neurology

## 2014-10-08 DIAGNOSIS — G245 Blepharospasm: Secondary | ICD-10-CM | POA: Diagnosis not present

## 2014-10-15 DIAGNOSIS — Z1231 Encounter for screening mammogram for malignant neoplasm of breast: Secondary | ICD-10-CM | POA: Diagnosis not present

## 2014-11-06 NOTE — Progress Notes (Signed)
I reviewed above note and agree with the assessment and plan.  Rosalin Hawking, MD PhD Stroke Neurology 11/06/2014 11:35 AM

## 2014-11-21 DIAGNOSIS — Z7952 Long term (current) use of systemic steroids: Secondary | ICD-10-CM | POA: Diagnosis not present

## 2014-11-21 DIAGNOSIS — M353 Polymyalgia rheumatica: Secondary | ICD-10-CM | POA: Diagnosis not present

## 2014-11-27 DIAGNOSIS — I10 Essential (primary) hypertension: Secondary | ICD-10-CM | POA: Diagnosis not present

## 2014-11-28 ENCOUNTER — Encounter: Payer: Self-pay | Admitting: Cardiovascular Disease

## 2014-11-28 DIAGNOSIS — E780 Pure hypercholesterolemia: Secondary | ICD-10-CM | POA: Diagnosis not present

## 2014-11-28 DIAGNOSIS — M810 Age-related osteoporosis without current pathological fracture: Secondary | ICD-10-CM | POA: Diagnosis not present

## 2014-11-28 DIAGNOSIS — M353 Polymyalgia rheumatica: Secondary | ICD-10-CM | POA: Diagnosis not present

## 2014-11-28 DIAGNOSIS — I10 Essential (primary) hypertension: Secondary | ICD-10-CM | POA: Diagnosis not present

## 2014-11-28 DIAGNOSIS — F419 Anxiety disorder, unspecified: Secondary | ICD-10-CM | POA: Diagnosis not present

## 2015-01-03 DIAGNOSIS — J385 Laryngeal spasm: Secondary | ICD-10-CM | POA: Diagnosis not present

## 2015-01-14 DIAGNOSIS — G245 Blepharospasm: Secondary | ICD-10-CM | POA: Diagnosis not present

## 2015-03-13 ENCOUNTER — Ambulatory Visit (INDEPENDENT_AMBULATORY_CARE_PROVIDER_SITE_OTHER): Payer: Medicare Other | Admitting: Neurology

## 2015-03-13 ENCOUNTER — Ambulatory Visit: Payer: 59 | Admitting: Neurology

## 2015-03-13 ENCOUNTER — Encounter: Payer: Self-pay | Admitting: Neurology

## 2015-03-13 VITALS — BP 166/74 | HR 57 | Wt 125.0 lb

## 2015-03-13 DIAGNOSIS — I63411 Cerebral infarction due to embolism of right middle cerebral artery: Secondary | ICD-10-CM | POA: Diagnosis not present

## 2015-03-13 DIAGNOSIS — I480 Paroxysmal atrial fibrillation: Secondary | ICD-10-CM | POA: Diagnosis not present

## 2015-03-13 NOTE — Progress Notes (Signed)
PATIENT: [name] [name] DOB: 19-Jan-1932  REASON FOR VISIT: routine follow up for stoke HISTORY FROM: patient  HISTORY OF PRESENT ILLNESS: Today we had the pleasure of seeing [name] [name] in follow-up at our Neurology Clinic. Pt was unaccompanied.    History Summary [name] is a 81 y.o. female with past medical history significant for HTN, hyperlipidemia, CAD s/p CABG, B12 deficiency who presented 04/26/14 with slurred speech and left sided weakness. Status post IV t-PA on 7/17 at 1822. After receiving IV tPA the patient was admitted to the neuro ICU for further evaluation and treatment and was managed under the post-tPA protocol. MRI revealed multiple small scattered infarcts in the R MCA distribution. The patient was taking ASA 81mg  prior to admission. Carotid Ultrasound showed <39% stenosis bilaterally. 2D Echo showed EF 50-55%, but severe pulmonary hypertension. Lower Extremity Ultrasound showed no DVT was present. EKG and telemetry showed atrial fibrillation, so patient was started on anticoagulation with apixaban 2.5 mg BID. The patient's LDL was 51, meeting goal of LDL <70, we put her on low dose statin for stroke prevention. Her HgbA1c was 6.4, so she received education on dietary changes for diabetes prevention.   09/04/14 follow up - the patient has been doing well. She had no residual deficits and no complaints. She has regular follow up with her PCP and cardiologist Dr. Burt Knack. She stated that she manages her own medication and she is compliant with meds and no missing doses. She is tolerating Eliquis well with no signs of significant bleeding or bruising. Blood pressure is well controlled, it is 141/77 in the office today. She states she gets regular exercise by walking her Yorkie  Interval History During the interval time, the pt has been doing well. No recurrent strokelike symptoms. She is tolerating eliquis well. She does not need to stop eliquis for Botox injection.  Her blood pressure today 166/74 in clinic, however she stated that at home her blood pressure between 120-135.    REVIEW OF SYSTEMS: Full 14 system review of systems performed and notable only for:  Constitutional: N/A  Cardiovascular:   Ear/Nose/Throat:   Skin:   Eyes:   Respiratory: N/A  Gastroitestinal: N/A  Genitourinary: N/A Hematology/Lymphatic: N/A  Endocrine: N/A  Musculoskeletal: Allergy/Immunology: N/A  Neurological:  Psychiatric:   ALLERGIES: No Known Allergies  HOME MEDICATIONS: Outpatient Prescriptions Prior to Visit  Medication Sig Dispense Refill  . acetaminophen (TYLENOL) 500 MG tablet Take 500 mg by mouth every 6 (six) hours as needed for moderate pain (pain).     Marland Kitchen amLODipine (NORVASC) 2.5 MG tablet Take 2.5 mg by mouth daily.     Marland Kitchen atorvastatin (LIPITOR) 10 MG tablet TAKE 1 TABLET (10 MG TOTAL) BY MOUTH DAILY AT 6 PM. 90 tablet 1  . ELIQUIS 2.5 MG TABS tablet TAKE 1 TABLET TWICE A DAY 60 tablet 0  . metoprolol (LOPRESSOR) 50 MG tablet Take 50 mg by mouth 2 (two) times daily.  0  . nitroGLYCERIN (NITROSTAT) 0.4 MG SL tablet Place 0.4 mg under the tongue every 5 (five) minutes as needed for chest pain.    . OnabotulinumtoxinA (BOTOX IJ) Inject as directed every 3 (three) months.    . predniSONE (DELTASONE) 1 MG tablet Take 2 mg by mouth daily with breakfast.    . sertraline (ZOLOFT) 25 MG tablet Take 25 mg by mouth daily.     Marland Kitchen amoxicillin (AMOXIL) 500 MG capsule Take 500 mg by mouth 2 (two) times daily.  No facility-administered medications prior to visit.    PHYSICAL EXAM Filed Vitals:   03/13/15 1126  BP: 166/74  Pulse: 57  Weight: 125 lb (56.7 kg)   Body mass index is 25.23 kg/(m^2).  General - Well nourished, well developed, in no apparent distress. Cardiovascular - Irregular rate and rhythm with no murmur.  Mental Status -   Level of arousal and orientation to time, place, and person were intact. Language including expression,  naming, repetition, comprehension, reading, and writing was assessed and found intact. Attention span and concentration were normal. Recent and remote memory appear intact. Fund of Knowledge was assessed and was intact.  Cranial Nerves II - XII - II - Visual field intact OU. III, IV, VI - Extraocular movements intact. V - Facial sensation intact bilaterally. VII - Facial movement intact bilaterally. VIII - Hearing & vestibular intact bilaterally. X - Palate elevates symmetrically. XI - Chin turning & shoulder shrug intact bilaterally. XII - Tongue protrusion intact.  Motor Strength - The patient's strength was normal in all extremities and pronator drift was absent.  Bulk was normal and fasciculations were absent.   Motor Tone - Muscle tone was assessed at the neck and appendages and was normal.  Reflexes - The patient's reflexes were normal in all extremities and she had no pathological reflexes.  Sensory - Light touch, temperature/pinprick were assessed and were normal.    Coordination - The patient had normal movements in the hands and feet with no ataxia or dysmetria.  Tremor was absent.  Gait and Station - The patient's transfers, posture, gait, station, and turns were observed as normal.  DIAGNOSTIC DATA (LABS, IMAGING, TESTING) I have personally reviewed the radiological images below and agree with the radiology interpretations.  CT head   04/26/14 IMPRESSION:   1. No acute intracranial findings.   2. Chronic microvascular white matter disease, remote left external   capsule lacunar infarct, and cerebral and cerebellar atrophy.   CTA  04/26/14 IMPRESSION:   No definable vessel occlusion. No flow limiting stenosis.   Atherosclerotic calcification in the carotid siphon regions. 50%   stenosis of the supra clinoid ICA on the right.   Mr Brain Wo Contrast   04/27/2014 IMPRESSION: Four sub cm foci of acute infarction scattered about the right middle cerebral artery distribution  consistent with embolic infarctions. No large confluent infarction, evidence of hemorrhage, or mass effect.   2D Echocardiogram 04/28/14 EF 50-55%, severe pulmonary hypertension   Transesophageal Echocardiogram cancelled due to afib on EKG   Carotid Doppler 04/27/14 <39% stenosis bilat   Lower Extremity Ultrasound Prelim: no DVT   EKG 04/27/14   Atrial fibrillation   Nonspecific ST and T wave abnormality , probably digitalis effect   CXR 04/26/14 IMPRESSION:   No acute cardiopulmonary abnormality seen.   Component     Latest Ref Rng 04/27/2014  Cholesterol     0 - 200 mg/dL 136  Triglycerides     <150 mg/dL 63  HDL Cholesterol     >39 mg/dL 72  Total CHOL/HDL Ratio      1.9  VLDL     0 - 40 mg/dL 13  LDL (calc)     0 - 99 mg/dL 51  Hemoglobin A1C     <5.7 % 6.4 (H)  Mean Plasma Glucose     <117 mg/dL 137 (H)     ASSESSMENT: As you may recall, she is a 81 y.o. Caucasian female with PMH of HTN, hyperlipidemia, CAD s/p CABG  admitted to [facility] in July for slurred speech and left-sided weakness. MRI showed multiple small infarcts in the right MCA distribution. Found to have atrial fibrillation on telemetry, started on Eliquis 2.5 mg twice a day for stroke prevention. She is doing well and current neuro exam essentially normal. Will continue the eliquis and statin for stroke prevention. She is getting botox injection periodically without stopping eliquis.   Plan:  - continue eliquis and lipitor for stroke prevention  - Follow up with your primary care physician for stroke risk factor modification. Recommend maintain blood pressure goal <130/80, diabetes with hemoglobin A1c goal below 6.5% and lipids with LDL cholesterol goal below 70 mg/dL.  - check BP at home  - bleeding precautions with botox injeciton. - RTC PRN.  Patient Instructions  - continue eliquis and lipitor for stroke prevention  - Follow up with your primary care physician for stroke risk factor modification. Recommend  maintain blood pressure goal <130/80, diabetes with hemoglobin A1c goal below 6.5% and lipids with LDL cholesterol goal below 70 mg/dL.  - check BP at home  - bleeding precautions with botox injeciton. - follow up as needed.

## 2015-03-13 NOTE — Patient Instructions (Addendum)
-   continue eliquis and lipitor for stroke prevention  - Follow up with your primary care physician for stroke risk factor modification. Recommend maintain blood pressure goal <130/80, diabetes with hemoglobin A1c goal below 6.5% and lipids with LDL cholesterol goal below 70 mg/dL.  - check BP at home  - bleeding precautions with botox injeciton. - follow up as needed.

## 2015-03-17 DIAGNOSIS — M353 Polymyalgia rheumatica: Secondary | ICD-10-CM | POA: Diagnosis not present

## 2015-03-17 DIAGNOSIS — Z7952 Long term (current) use of systemic steroids: Secondary | ICD-10-CM | POA: Diagnosis not present

## 2015-03-25 ENCOUNTER — Other Ambulatory Visit: Payer: Self-pay | Admitting: Neurology

## 2015-03-27 DIAGNOSIS — E780 Pure hypercholesterolemia: Secondary | ICD-10-CM | POA: Diagnosis not present

## 2015-03-27 DIAGNOSIS — I10 Essential (primary) hypertension: Secondary | ICD-10-CM | POA: Diagnosis not present

## 2015-03-27 DIAGNOSIS — M810 Age-related osteoporosis without current pathological fracture: Secondary | ICD-10-CM | POA: Diagnosis not present

## 2015-03-27 DIAGNOSIS — M353 Polymyalgia rheumatica: Secondary | ICD-10-CM | POA: Diagnosis not present

## 2015-04-01 DIAGNOSIS — E780 Pure hypercholesterolemia: Secondary | ICD-10-CM | POA: Diagnosis not present

## 2015-04-01 DIAGNOSIS — I10 Essential (primary) hypertension: Secondary | ICD-10-CM | POA: Diagnosis not present

## 2015-04-01 DIAGNOSIS — M353 Polymyalgia rheumatica: Secondary | ICD-10-CM | POA: Diagnosis not present

## 2015-04-01 DIAGNOSIS — M810 Age-related osteoporosis without current pathological fracture: Secondary | ICD-10-CM | POA: Diagnosis not present

## 2015-04-22 DIAGNOSIS — G245 Blepharospasm: Secondary | ICD-10-CM | POA: Diagnosis not present

## 2015-05-04 ENCOUNTER — Other Ambulatory Visit: Payer: Self-pay | Admitting: Neurology

## 2015-05-12 DIAGNOSIS — N390 Urinary tract infection, site not specified: Secondary | ICD-10-CM | POA: Diagnosis not present

## 2015-05-12 DIAGNOSIS — R300 Dysuria: Secondary | ICD-10-CM | POA: Diagnosis not present

## 2015-07-17 DIAGNOSIS — R300 Dysuria: Secondary | ICD-10-CM | POA: Diagnosis not present

## 2015-07-17 DIAGNOSIS — N390 Urinary tract infection, site not specified: Secondary | ICD-10-CM | POA: Diagnosis not present

## 2015-07-29 DIAGNOSIS — G245 Blepharospasm: Secondary | ICD-10-CM | POA: Diagnosis not present

## 2015-07-31 DIAGNOSIS — E559 Vitamin D deficiency, unspecified: Secondary | ICD-10-CM | POA: Diagnosis not present

## 2015-07-31 DIAGNOSIS — E785 Hyperlipidemia, unspecified: Secondary | ICD-10-CM | POA: Diagnosis not present

## 2015-07-31 DIAGNOSIS — I10 Essential (primary) hypertension: Secondary | ICD-10-CM | POA: Diagnosis not present

## 2015-07-31 DIAGNOSIS — M859 Disorder of bone density and structure, unspecified: Secondary | ICD-10-CM | POA: Diagnosis not present

## 2015-08-05 DIAGNOSIS — M353 Polymyalgia rheumatica: Secondary | ICD-10-CM | POA: Diagnosis not present

## 2015-08-05 DIAGNOSIS — E7800 Pure hypercholesterolemia, unspecified: Secondary | ICD-10-CM | POA: Diagnosis not present

## 2015-08-05 DIAGNOSIS — I480 Paroxysmal atrial fibrillation: Secondary | ICD-10-CM | POA: Diagnosis not present

## 2015-08-05 DIAGNOSIS — I10 Essential (primary) hypertension: Secondary | ICD-10-CM | POA: Diagnosis not present

## 2015-08-05 DIAGNOSIS — Z23 Encounter for immunization: Secondary | ICD-10-CM | POA: Diagnosis not present

## 2015-08-07 ENCOUNTER — Ambulatory Visit: Payer: 59 | Admitting: Physician Assistant

## 2015-08-11 ENCOUNTER — Other Ambulatory Visit (HOSPITAL_COMMUNITY): Payer: Self-pay | Admitting: Internal Medicine

## 2015-08-11 DIAGNOSIS — R0600 Dyspnea, unspecified: Secondary | ICD-10-CM

## 2015-08-15 DIAGNOSIS — J385 Laryngeal spasm: Secondary | ICD-10-CM | POA: Diagnosis not present

## 2015-08-15 DIAGNOSIS — R1310 Dysphagia, unspecified: Secondary | ICD-10-CM | POA: Diagnosis not present

## 2015-08-18 ENCOUNTER — Telehealth (HOSPITAL_COMMUNITY): Payer: Self-pay

## 2015-08-18 NOTE — Telephone Encounter (Signed)
Patient given detailed instructions per Myocardial Perfusion Study Information Sheet for the test on 08-20-2015 at 1000. Patient notified to arrive at [facility] for Echo, and that it is imperative to arrive on time for appointment to keep from having the test rescheduled.  If you need to cancel or reschedule your appointment, please call the office within 24 hours of your appointment. Failure to do so may result in a cancellation of your appointment, and a $50 no show fee. Patient verbalized understanding.Oletta Lamas, Hidaya Daniel A

## 2015-08-20 ENCOUNTER — Ambulatory Visit (HOSPITAL_COMMUNITY): Payer: Medicare Other | Attending: Cardiology

## 2015-08-20 ENCOUNTER — Other Ambulatory Visit: Payer: Self-pay

## 2015-08-20 ENCOUNTER — Ambulatory Visit (HOSPITAL_BASED_OUTPATIENT_CLINIC_OR_DEPARTMENT_OTHER): Payer: Medicare Other

## 2015-08-20 DIAGNOSIS — I272 Other secondary pulmonary hypertension: Secondary | ICD-10-CM | POA: Insufficient documentation

## 2015-08-20 DIAGNOSIS — I4891 Unspecified atrial fibrillation: Secondary | ICD-10-CM | POA: Insufficient documentation

## 2015-08-20 DIAGNOSIS — R0600 Dyspnea, unspecified: Secondary | ICD-10-CM | POA: Diagnosis not present

## 2015-08-20 DIAGNOSIS — R0609 Other forms of dyspnea: Secondary | ICD-10-CM | POA: Insufficient documentation

## 2015-08-20 DIAGNOSIS — Z8673 Personal history of transient ischemic attack (TIA), and cerebral infarction without residual deficits: Secondary | ICD-10-CM | POA: Insufficient documentation

## 2015-08-20 DIAGNOSIS — Z951 Presence of aortocoronary bypass graft: Secondary | ICD-10-CM | POA: Diagnosis not present

## 2015-08-20 DIAGNOSIS — I10 Essential (primary) hypertension: Secondary | ICD-10-CM | POA: Insufficient documentation

## 2015-08-20 DIAGNOSIS — I2510 Atherosclerotic heart disease of native coronary artery without angina pectoris: Secondary | ICD-10-CM | POA: Diagnosis not present

## 2015-08-20 LAB — MYOCARDIAL PERFUSION IMAGING
CHL CUP NUCLEAR SSS: 2
LVDIAVOL: 74 mL
LVSYSVOL: 40 mL
NUC STRESS TID: 0.64
Peak HR: 120 {beats}/min
RATE: 0.24
Rest HR: 99 {beats}/min
SDS: 2
SRS: 0

## 2015-08-20 MED ORDER — TECHNETIUM TC 99M SESTAMIBI GENERIC - CARDIOLITE
30.9000 | Freq: Once | INTRAVENOUS | Status: AC | PRN
  Administered 2015-08-20: 30.9 via INTRAVENOUS

## 2015-08-20 MED ORDER — TECHNETIUM TC 99M SESTAMIBI GENERIC - CARDIOLITE
10.2000 | Freq: Once | INTRAVENOUS | Status: AC | PRN
  Administered 2015-08-20: 10 via INTRAVENOUS

## 2015-08-20 MED ORDER — REGADENOSON 0.4 MG/5ML IV SOLN
0.4000 mg | Freq: Once | INTRAVENOUS | Status: AC
  Administered 2015-08-20: 0.4 mg via INTRAVENOUS

## 2015-09-03 ENCOUNTER — Other Ambulatory Visit (HOSPITAL_COMMUNITY): Payer: Self-pay | Admitting: Respiratory Therapy

## 2015-09-03 DIAGNOSIS — E7800 Pure hypercholesterolemia, unspecified: Secondary | ICD-10-CM | POA: Diagnosis not present

## 2015-09-03 DIAGNOSIS — R0609 Other forms of dyspnea: Secondary | ICD-10-CM | POA: Diagnosis not present

## 2015-09-03 DIAGNOSIS — M353 Polymyalgia rheumatica: Secondary | ICD-10-CM | POA: Diagnosis not present

## 2015-09-03 DIAGNOSIS — R0600 Dyspnea, unspecified: Secondary | ICD-10-CM

## 2015-09-03 DIAGNOSIS — I10 Essential (primary) hypertension: Secondary | ICD-10-CM | POA: Diagnosis not present

## 2015-09-03 DIAGNOSIS — J441 Chronic obstructive pulmonary disease with (acute) exacerbation: Secondary | ICD-10-CM

## 2015-09-09 DIAGNOSIS — M546 Pain in thoracic spine: Secondary | ICD-10-CM | POA: Diagnosis not present

## 2015-09-09 DIAGNOSIS — M545 Low back pain: Secondary | ICD-10-CM | POA: Diagnosis not present

## 2015-09-11 ENCOUNTER — Encounter (HOSPITAL_COMMUNITY): Payer: 59

## 2015-10-10 ENCOUNTER — Ambulatory Visit (HOSPITAL_COMMUNITY)
Admission: RE | Admit: 2015-10-10 | Discharge: 2015-10-10 | Disposition: A | Discharge status: Home / Self Care | Payer: Medicare Other | Source: Ambulatory Visit | Attending: Internal Medicine | Admitting: Internal Medicine

## 2015-10-10 DIAGNOSIS — R0600 Dyspnea, unspecified: Secondary | ICD-10-CM | POA: Insufficient documentation

## 2015-10-10 DIAGNOSIS — J441 Chronic obstructive pulmonary disease with (acute) exacerbation: Secondary | ICD-10-CM | POA: Insufficient documentation

## 2015-10-10 LAB — PULMONARY FUNCTION TEST
DL/VA % pred: 94 %
DL/VA: 4.01 ml/min/mmHg/L
DLCO unc % pred: 61 %
DLCO unc: 11.53 ml/min/mmHg
FEF 25-75 PRE: 0.73 L/s
FEF2575-%PRED-PRE: 65 %
FEV1-%PRED-PRE: 69 %
FEV1-PRE: 1.07 L
FEV1FVC-%Pred-Pre: 82 %
FEV6-%PRED-PRE: 89 %
FEV6-Pre: 1.75 L
FEV6FVC-%Pred-Pre: 106 %
FVC-%Pred-Pre: 83 %
FVC-Pre: 1.75 L
PRE FEV1/FVC RATIO: 61 %
Pre FEV6/FVC Ratio: 100 %
RV % PRED: 104 %
RV: 2.32 L
TLC % pred: 88 %
TLC: 3.96 L

## 2015-10-14 DIAGNOSIS — J449 Chronic obstructive pulmonary disease, unspecified: Secondary | ICD-10-CM | POA: Diagnosis not present

## 2015-10-14 DIAGNOSIS — E7800 Pure hypercholesterolemia, unspecified: Secondary | ICD-10-CM | POA: Diagnosis not present

## 2015-10-14 DIAGNOSIS — I10 Essential (primary) hypertension: Secondary | ICD-10-CM | POA: Diagnosis not present

## 2015-11-04 DIAGNOSIS — G245 Blepharospasm: Secondary | ICD-10-CM | POA: Diagnosis not present

## 2015-11-24 ENCOUNTER — Ambulatory Visit: Payer: 59 | Admitting: Cardiovascular Disease

## 2015-12-22 DIAGNOSIS — N390 Urinary tract infection, site not specified: Secondary | ICD-10-CM | POA: Diagnosis not present

## 2015-12-22 DIAGNOSIS — R300 Dysuria: Secondary | ICD-10-CM | POA: Diagnosis not present

## 2016-01-19 DIAGNOSIS — M810 Age-related osteoporosis without current pathological fracture: Secondary | ICD-10-CM | POA: Diagnosis not present

## 2016-01-19 DIAGNOSIS — I10 Essential (primary) hypertension: Secondary | ICD-10-CM | POA: Diagnosis not present

## 2016-01-26 DIAGNOSIS — E7800 Pure hypercholesterolemia, unspecified: Secondary | ICD-10-CM | POA: Diagnosis not present

## 2016-01-26 DIAGNOSIS — M810 Age-related osteoporosis without current pathological fracture: Secondary | ICD-10-CM | POA: Diagnosis not present

## 2016-01-26 DIAGNOSIS — I10 Essential (primary) hypertension: Secondary | ICD-10-CM | POA: Diagnosis not present

## 2016-01-26 DIAGNOSIS — M353 Polymyalgia rheumatica: Secondary | ICD-10-CM | POA: Diagnosis not present

## 2016-01-30 DIAGNOSIS — J385 Laryngeal spasm: Secondary | ICD-10-CM | POA: Diagnosis not present

## 2016-02-10 DIAGNOSIS — G245 Blepharospasm: Secondary | ICD-10-CM | POA: Diagnosis not present

## 2016-04-13 ENCOUNTER — Other Ambulatory Visit: Payer: Self-pay | Admitting: Neurology

## 2016-05-06 ENCOUNTER — Other Ambulatory Visit: Payer: Self-pay | Admitting: Neurology

## 2016-05-18 DIAGNOSIS — G245 Blepharospasm: Secondary | ICD-10-CM | POA: Diagnosis not present

## 2016-05-19 NOTE — Progress Notes (Signed)
Patient was sent a letter on 05/19/2016 from Dr.Xu about future refills. Pt was last seen 03/2015 and was told to follow up as needed. Pt has no follow up appts. PT was given refills for lipitor and eliquis. Letter states once refills end she needs to contact PCP.

## 2016-05-22 ENCOUNTER — Other Ambulatory Visit: Payer: Self-pay | Admitting: Neurology

## 2016-06-04 ENCOUNTER — Other Ambulatory Visit: Payer: Self-pay | Admitting: Neurology

## 2016-06-13 ENCOUNTER — Other Ambulatory Visit: Payer: Self-pay | Admitting: Neurology

## 2016-07-27 DIAGNOSIS — E7800 Pure hypercholesterolemia, unspecified: Secondary | ICD-10-CM | POA: Diagnosis not present

## 2016-07-27 DIAGNOSIS — M353 Polymyalgia rheumatica: Secondary | ICD-10-CM | POA: Diagnosis not present

## 2016-07-27 DIAGNOSIS — Z0000 Encounter for general adult medical examination without abnormal findings: Secondary | ICD-10-CM | POA: Diagnosis not present

## 2016-07-27 DIAGNOSIS — I10 Essential (primary) hypertension: Secondary | ICD-10-CM | POA: Diagnosis not present

## 2016-07-27 DIAGNOSIS — M810 Age-related osteoporosis without current pathological fracture: Secondary | ICD-10-CM | POA: Diagnosis not present

## 2016-07-30 DIAGNOSIS — J385 Laryngeal spasm: Secondary | ICD-10-CM | POA: Diagnosis not present

## 2016-08-18 DIAGNOSIS — Z1231 Encounter for screening mammogram for malignant neoplasm of breast: Secondary | ICD-10-CM | POA: Diagnosis not present

## 2016-08-24 DIAGNOSIS — G245 Blepharospasm: Secondary | ICD-10-CM | POA: Diagnosis not present

## 2016-11-30 DIAGNOSIS — G245 Blepharospasm: Secondary | ICD-10-CM | POA: Diagnosis not present

## 2017-01-26 DIAGNOSIS — M810 Age-related osteoporosis without current pathological fracture: Secondary | ICD-10-CM | POA: Diagnosis not present

## 2017-01-26 DIAGNOSIS — E7800 Pure hypercholesterolemia, unspecified: Secondary | ICD-10-CM | POA: Diagnosis not present

## 2017-01-26 DIAGNOSIS — I4891 Unspecified atrial fibrillation: Secondary | ICD-10-CM | POA: Diagnosis not present

## 2017-01-26 DIAGNOSIS — E559 Vitamin D deficiency, unspecified: Secondary | ICD-10-CM | POA: Diagnosis not present

## 2017-01-26 DIAGNOSIS — I10 Essential (primary) hypertension: Secondary | ICD-10-CM | POA: Diagnosis not present

## 2017-02-02 DIAGNOSIS — I10 Essential (primary) hypertension: Secondary | ICD-10-CM | POA: Diagnosis not present

## 2017-02-02 DIAGNOSIS — M810 Age-related osteoporosis without current pathological fracture: Secondary | ICD-10-CM | POA: Diagnosis not present

## 2017-02-02 DIAGNOSIS — M353 Polymyalgia rheumatica: Secondary | ICD-10-CM | POA: Diagnosis not present

## 2017-02-02 DIAGNOSIS — Z23 Encounter for immunization: Secondary | ICD-10-CM | POA: Diagnosis not present

## 2017-02-02 DIAGNOSIS — E7800 Pure hypercholesterolemia, unspecified: Secondary | ICD-10-CM | POA: Diagnosis not present

## 2017-02-04 DIAGNOSIS — J385 Laryngeal spasm: Secondary | ICD-10-CM | POA: Diagnosis not present

## 2017-03-08 DIAGNOSIS — G245 Blepharospasm: Secondary | ICD-10-CM | POA: Diagnosis not present

## 2017-06-17 DIAGNOSIS — G245 Blepharospasm: Secondary | ICD-10-CM | POA: Diagnosis not present

## 2017-08-03 DIAGNOSIS — I10 Essential (primary) hypertension: Secondary | ICD-10-CM | POA: Diagnosis not present

## 2017-08-03 DIAGNOSIS — E039 Hypothyroidism, unspecified: Secondary | ICD-10-CM | POA: Diagnosis not present

## 2017-08-03 DIAGNOSIS — Z0000 Encounter for general adult medical examination without abnormal findings: Secondary | ICD-10-CM | POA: Diagnosis not present

## 2017-08-03 DIAGNOSIS — Z23 Encounter for immunization: Secondary | ICD-10-CM | POA: Diagnosis not present

## 2017-08-26 DIAGNOSIS — J385 Laryngeal spasm: Secondary | ICD-10-CM | POA: Diagnosis not present

## 2017-09-27 DIAGNOSIS — G245 Blepharospasm: Secondary | ICD-10-CM | POA: Diagnosis not present

## 2017-11-11 DIAGNOSIS — K649 Unspecified hemorrhoids: Secondary | ICD-10-CM | POA: Diagnosis not present

## 2017-12-14 DIAGNOSIS — N390 Urinary tract infection, site not specified: Secondary | ICD-10-CM | POA: Diagnosis not present

## 2018-01-03 DIAGNOSIS — G245 Blepharospasm: Secondary | ICD-10-CM | POA: Diagnosis not present

## 2018-02-01 DIAGNOSIS — I10 Essential (primary) hypertension: Secondary | ICD-10-CM | POA: Diagnosis not present

## 2018-02-01 DIAGNOSIS — E039 Hypothyroidism, unspecified: Secondary | ICD-10-CM | POA: Diagnosis not present

## 2018-02-08 DIAGNOSIS — I10 Essential (primary) hypertension: Secondary | ICD-10-CM | POA: Diagnosis not present

## 2018-02-08 DIAGNOSIS — E039 Hypothyroidism, unspecified: Secondary | ICD-10-CM | POA: Diagnosis not present

## 2018-02-08 DIAGNOSIS — E785 Hyperlipidemia, unspecified: Secondary | ICD-10-CM | POA: Diagnosis not present

## 2018-02-08 DIAGNOSIS — E7800 Pure hypercholesterolemia, unspecified: Secondary | ICD-10-CM | POA: Diagnosis not present

## 2018-02-08 DIAGNOSIS — Z0000 Encounter for general adult medical examination without abnormal findings: Secondary | ICD-10-CM | POA: Diagnosis not present

## 2018-03-02 DIAGNOSIS — R109 Unspecified abdominal pain: Secondary | ICD-10-CM | POA: Diagnosis not present

## 2018-03-02 DIAGNOSIS — Z0000 Encounter for general adult medical examination without abnormal findings: Secondary | ICD-10-CM | POA: Diagnosis not present

## 2018-03-02 DIAGNOSIS — E039 Hypothyroidism, unspecified: Secondary | ICD-10-CM | POA: Diagnosis not present

## 2018-03-03 ENCOUNTER — Other Ambulatory Visit: Payer: Self-pay

## 2018-03-03 ENCOUNTER — Encounter (HOSPITAL_COMMUNITY): Payer: Self-pay | Admitting: Pharmacy Technician

## 2018-03-03 ENCOUNTER — Inpatient Hospital Stay (HOSPITAL_COMMUNITY)
Admission: EM | Admit: 2018-03-03 | Discharge: 2018-03-11 | DRG: 375 | Disposition: A | Discharge status: Home / Self Care | Payer: Medicare Other | Attending: Internal Medicine | Admitting: Internal Medicine

## 2018-03-03 ENCOUNTER — Emergency Department (HOSPITAL_COMMUNITY): Payer: Medicare Other

## 2018-03-03 DIAGNOSIS — J385 Laryngeal spasm: Secondary | ICD-10-CM | POA: Diagnosis present

## 2018-03-03 DIAGNOSIS — Z7952 Long term (current) use of systemic steroids: Secondary | ICD-10-CM

## 2018-03-03 DIAGNOSIS — I25810 Atherosclerosis of coronary artery bypass graft(s) without angina pectoris: Secondary | ICD-10-CM | POA: Diagnosis not present

## 2018-03-03 DIAGNOSIS — K921 Melena: Secondary | ICD-10-CM | POA: Diagnosis not present

## 2018-03-03 DIAGNOSIS — R109 Unspecified abdominal pain: Secondary | ICD-10-CM | POA: Diagnosis not present

## 2018-03-03 DIAGNOSIS — Z8673 Personal history of transient ischemic attack (TIA), and cerebral infarction without residual deficits: Secondary | ICD-10-CM

## 2018-03-03 DIAGNOSIS — R1902 Left upper quadrant abdominal swelling, mass and lump: Secondary | ICD-10-CM | POA: Diagnosis not present

## 2018-03-03 DIAGNOSIS — N949 Unspecified condition associated with female genital organs and menstrual cycle: Secondary | ICD-10-CM | POA: Diagnosis not present

## 2018-03-03 DIAGNOSIS — N83201 Unspecified ovarian cyst, right side: Secondary | ICD-10-CM | POA: Diagnosis not present

## 2018-03-03 DIAGNOSIS — I639 Cerebral infarction, unspecified: Secondary | ICD-10-CM | POA: Diagnosis present

## 2018-03-03 DIAGNOSIS — I272 Pulmonary hypertension, unspecified: Secondary | ICD-10-CM | POA: Diagnosis present

## 2018-03-03 DIAGNOSIS — K668 Other specified disorders of peritoneum: Secondary | ICD-10-CM

## 2018-03-03 DIAGNOSIS — C181 Malignant neoplasm of appendix: Secondary | ICD-10-CM | POA: Diagnosis not present

## 2018-03-03 DIAGNOSIS — Z7901 Long term (current) use of anticoagulants: Secondary | ICD-10-CM | POA: Diagnosis not present

## 2018-03-03 DIAGNOSIS — N131 Hydronephrosis with ureteral stricture, not elsewhere classified: Secondary | ICD-10-CM | POA: Diagnosis present

## 2018-03-03 DIAGNOSIS — N839 Noninflammatory disorder of ovary, fallopian tube and broad ligament, unspecified: Secondary | ICD-10-CM | POA: Diagnosis present

## 2018-03-03 DIAGNOSIS — R933 Abnormal findings on diagnostic imaging of other parts of digestive tract: Secondary | ICD-10-CM | POA: Diagnosis not present

## 2018-03-03 DIAGNOSIS — D638 Anemia in other chronic diseases classified elsewhere: Secondary | ICD-10-CM | POA: Diagnosis present

## 2018-03-03 DIAGNOSIS — I2510 Atherosclerotic heart disease of native coronary artery without angina pectoris: Secondary | ICD-10-CM | POA: Diagnosis present

## 2018-03-03 DIAGNOSIS — I480 Paroxysmal atrial fibrillation: Secondary | ICD-10-CM

## 2018-03-03 DIAGNOSIS — I10 Essential (primary) hypertension: Secondary | ICD-10-CM | POA: Diagnosis present

## 2018-03-03 DIAGNOSIS — Z90710 Acquired absence of both cervix and uterus: Secondary | ICD-10-CM

## 2018-03-03 DIAGNOSIS — R1084 Generalized abdominal pain: Secondary | ICD-10-CM | POA: Diagnosis not present

## 2018-03-03 DIAGNOSIS — K449 Diaphragmatic hernia without obstruction or gangrene: Secondary | ICD-10-CM | POA: Diagnosis present

## 2018-03-03 DIAGNOSIS — R1031 Right lower quadrant pain: Secondary | ICD-10-CM | POA: Diagnosis present

## 2018-03-03 DIAGNOSIS — Z8249 Family history of ischemic heart disease and other diseases of the circulatory system: Secondary | ICD-10-CM

## 2018-03-03 DIAGNOSIS — Z79899 Other long term (current) drug therapy: Secondary | ICD-10-CM

## 2018-03-03 DIAGNOSIS — C801 Malignant (primary) neoplasm, unspecified: Secondary | ICD-10-CM | POA: Diagnosis not present

## 2018-03-03 DIAGNOSIS — E7800 Pure hypercholesterolemia, unspecified: Secondary | ICD-10-CM | POA: Diagnosis present

## 2018-03-03 DIAGNOSIS — K642 Third degree hemorrhoids: Secondary | ICD-10-CM | POA: Diagnosis present

## 2018-03-03 DIAGNOSIS — K5730 Diverticulosis of large intestine without perforation or abscess without bleeding: Secondary | ICD-10-CM | POA: Diagnosis present

## 2018-03-03 DIAGNOSIS — N1330 Unspecified hydronephrosis: Secondary | ICD-10-CM | POA: Diagnosis not present

## 2018-03-03 DIAGNOSIS — Z823 Family history of stroke: Secondary | ICD-10-CM | POA: Diagnosis not present

## 2018-03-03 DIAGNOSIS — Z951 Presence of aortocoronary bypass graft: Secondary | ICD-10-CM | POA: Diagnosis not present

## 2018-03-03 DIAGNOSIS — N838 Other noninflammatory disorders of ovary, fallopian tube and broad ligament: Secondary | ICD-10-CM

## 2018-03-03 DIAGNOSIS — N2889 Other specified disorders of kidney and ureter: Secondary | ICD-10-CM | POA: Diagnosis not present

## 2018-03-03 DIAGNOSIS — I482 Chronic atrial fibrillation: Secondary | ICD-10-CM | POA: Diagnosis present

## 2018-03-03 DIAGNOSIS — R110 Nausea: Secondary | ICD-10-CM | POA: Diagnosis not present

## 2018-03-03 DIAGNOSIS — E538 Deficiency of other specified B group vitamins: Secondary | ICD-10-CM | POA: Diagnosis present

## 2018-03-03 DIAGNOSIS — C786 Secondary malignant neoplasm of retroperitoneum and peritoneum: Principal | ICD-10-CM | POA: Diagnosis present

## 2018-03-03 DIAGNOSIS — N83202 Unspecified ovarian cyst, left side: Secondary | ICD-10-CM | POA: Diagnosis not present

## 2018-03-03 DIAGNOSIS — R970 Elevated carcinoembryonic antigen [CEA]: Secondary | ICD-10-CM | POA: Diagnosis not present

## 2018-03-03 DIAGNOSIS — Z79891 Long term (current) use of opiate analgesic: Secondary | ICD-10-CM

## 2018-03-03 DIAGNOSIS — R531 Weakness: Secondary | ICD-10-CM | POA: Diagnosis not present

## 2018-03-03 DIAGNOSIS — N9489 Other specified conditions associated with female genital organs and menstrual cycle: Secondary | ICD-10-CM

## 2018-03-03 DIAGNOSIS — R112 Nausea with vomiting, unspecified: Secondary | ICD-10-CM | POA: Diagnosis not present

## 2018-03-03 DIAGNOSIS — R1909 Other intra-abdominal and pelvic swelling, mass and lump: Secondary | ICD-10-CM | POA: Diagnosis not present

## 2018-03-03 DIAGNOSIS — Z79890 Hormone replacement therapy (postmenopausal): Secondary | ICD-10-CM

## 2018-03-03 DIAGNOSIS — I4891 Unspecified atrial fibrillation: Secondary | ICD-10-CM | POA: Diagnosis present

## 2018-03-03 DIAGNOSIS — Z87891 Personal history of nicotine dependence: Secondary | ICD-10-CM

## 2018-03-03 HISTORY — DX: Other diseases of vocal cords: J38.3

## 2018-03-03 HISTORY — DX: Personal history of transient ischemic attack (TIA), and cerebral infarction without residual deficits: Z86.73

## 2018-03-03 HISTORY — DX: Personal history of other diseases of the circulatory system: Z86.79

## 2018-03-03 LAB — URINALYSIS, ROUTINE W REFLEX MICROSCOPIC
BILIRUBIN URINE: NEGATIVE
Glucose, UA: NEGATIVE mg/dL
Hgb urine dipstick: NEGATIVE
KETONES UR: 5 mg/dL — AB
Nitrite: NEGATIVE
Protein, ur: NEGATIVE mg/dL
SPECIFIC GRAVITY, URINE: 1.014 (ref 1.005–1.030)
pH: 7 (ref 5.0–8.0)

## 2018-03-03 LAB — COMPREHENSIVE METABOLIC PANEL
ALT: 11 U/L — AB (ref 14–54)
AST: 19 U/L (ref 15–41)
Albumin: 3.8 g/dL (ref 3.5–5.0)
Alkaline Phosphatase: 55 U/L (ref 38–126)
Anion gap: 10 (ref 5–15)
BUN: 21 mg/dL — AB (ref 6–20)
CALCIUM: 9.5 mg/dL (ref 8.9–10.3)
CO2: 26 mmol/L (ref 22–32)
Chloride: 103 mmol/L (ref 101–111)
Creatinine, Ser: 1.16 mg/dL — ABNORMAL HIGH (ref 0.44–1.00)
GFR, EST AFRICAN AMERICAN: 49 mL/min — AB (ref >60)
GFR, EST NON AFRICAN AMERICAN: 42 mL/min — AB (ref >60)
Glucose, Bld: 120 mg/dL — ABNORMAL HIGH (ref 65–99)
Potassium: 3.5 mmol/L (ref 3.5–5.1)
Sodium: 139 mmol/L (ref 135–145)
Total Bilirubin: 0.7 mg/dL (ref 0.3–1.2)
Total Protein: 6.9 g/dL (ref 6.5–8.1)

## 2018-03-03 LAB — CBC
HCT: 39.5 % (ref 36.0–46.0)
Hemoglobin: 12.2 g/dL (ref 12.0–15.0)
MCH: 26.9 pg (ref 26.0–34.0)
MCHC: 30.9 g/dL (ref 30.0–36.0)
MCV: 87.2 fL (ref 78.0–100.0)
Platelets: 269 10*3/uL (ref 150–400)
RBC: 4.53 MIL/uL (ref 3.87–5.11)
RDW: 13.3 % (ref 11.5–15.5)
WBC: 10.2 10*3/uL (ref 4.0–10.5)

## 2018-03-03 LAB — LIPASE, BLOOD: Lipase: 31 U/L (ref 11–51)

## 2018-03-03 LAB — POC OCCULT BLOOD, ED: Fecal Occult Bld: NEGATIVE

## 2018-03-03 LAB — I-STAT TROPONIN, ED: TROPONIN I, POC: 0 ng/mL (ref 0.00–0.08)

## 2018-03-03 MED ORDER — ALBUTEROL SULFATE (2.5 MG/3ML) 0.083% IN NEBU: 2.5000 mg | INHALATION_SOLUTION | RESPIRATORY_TRACT | Status: DC | PRN

## 2018-03-03 MED ORDER — ONDANSETRON HCL 4 MG/2ML IJ SOLN
4.0000 mg | Freq: Four times a day (QID) | INTRAMUSCULAR | Status: DC | PRN
  Administered 2018-03-03 – 2018-03-05 (×3): 4 mg via INTRAVENOUS
  Filled 2018-03-03 (×3): qty 2

## 2018-03-03 MED ORDER — HEPARIN BOLUS VIA INFUSION
2000.0000 [IU] | Freq: Once | INTRAVENOUS | Status: AC
  Administered 2018-03-03: 2000 [IU] via INTRAVENOUS
  Filled 2018-03-03: qty 2000

## 2018-03-03 MED ORDER — SODIUM CHLORIDE 0.9 % IV BOLUS
1000.0000 mL | Freq: Once | INTRAVENOUS | Status: AC
  Administered 2018-03-03: 1000 mL via INTRAVENOUS

## 2018-03-03 MED ORDER — HEPARIN (PORCINE) IN NACL 100-0.45 UNIT/ML-% IJ SOLN
800.0000 [IU]/h | INTRAMUSCULAR | Status: AC
  Administered 2018-03-03: 800 [IU]/h via INTRAVENOUS
  Administered 2018-03-04: 900 [IU]/h via INTRAVENOUS
  Administered 2018-03-06: 800 [IU]/h via INTRAVENOUS
  Filled 2018-03-03 (×6): qty 250

## 2018-03-03 MED ORDER — ONDANSETRON HCL 4 MG PO TABS: 4.0000 mg | ORAL_TABLET | Freq: Four times a day (QID) | ORAL | Status: DC | PRN

## 2018-03-03 MED ORDER — POLYETHYLENE GLYCOL 3350 17 G PO PACK: 17.0000 g | PACK | Freq: Every day | ORAL | Status: DC | PRN

## 2018-03-03 MED ORDER — SODIUM CHLORIDE 0.9 % IV SOLN
INTRAVENOUS | Status: DC
  Administered 2018-03-04: 14:00:00 via INTRAVENOUS

## 2018-03-03 MED ORDER — METOPROLOL TARTRATE 25 MG PO TABS
75.0000 mg | ORAL_TABLET | Freq: Two times a day (BID) | ORAL | Status: DC
  Administered 2018-03-03 – 2018-03-11 (×16): 75 mg via ORAL
  Filled 2018-03-03 (×17): qty 1

## 2018-03-03 MED ORDER — IOHEXOL 300 MG/ML  SOLN
100.0000 mL | Freq: Once | INTRAMUSCULAR | Status: AC | PRN
  Administered 2018-03-03: 80 mL via INTRAVENOUS

## 2018-03-03 MED ORDER — OXYCODONE-ACETAMINOPHEN 5-325 MG PO TABS
1.0000 | ORAL_TABLET | Freq: Four times a day (QID) | ORAL | Status: DC | PRN
  Administered 2018-03-03 – 2018-03-08 (×12): 2 via ORAL
  Administered 2018-03-09: 1 via ORAL
  Administered 2018-03-09: 2 via ORAL
  Administered 2018-03-09 – 2018-03-10 (×2): 1 via ORAL
  Administered 2018-03-10 – 2018-03-11 (×4): 2 via ORAL
  Filled 2018-03-03 (×2): qty 1
  Filled 2018-03-03 (×2): qty 2
  Filled 2018-03-03: qty 1
  Filled 2018-03-03 (×9): qty 2
  Filled 2018-03-03 (×2): qty 1
  Filled 2018-03-03 (×5): qty 2

## 2018-03-03 MED ORDER — ACETAMINOPHEN 650 MG RE SUPP: 650.0000 mg | Freq: Four times a day (QID) | RECTAL | Status: DC | PRN

## 2018-03-03 MED ORDER — SODIUM CHLORIDE 0.9 % IV BOLUS
500.0000 mL | Freq: Once | INTRAVENOUS | Status: AC
  Administered 2018-03-03: 500 mL via INTRAVENOUS

## 2018-03-03 MED ORDER — ATORVASTATIN CALCIUM 10 MG PO TABS
10.0000 mg | ORAL_TABLET | Freq: Every day | ORAL | Status: DC
  Administered 2018-03-04 – 2018-03-06 (×3): 10 mg via ORAL
  Filled 2018-03-03 (×4): qty 1

## 2018-03-03 MED ORDER — ACETAMINOPHEN 325 MG PO TABS
650.0000 mg | ORAL_TABLET | Freq: Four times a day (QID) | ORAL | Status: DC | PRN
  Administered 2018-03-06: 650 mg via ORAL
  Filled 2018-03-03: qty 2

## 2018-03-03 MED ORDER — MORPHINE SULFATE (PF) 2 MG/ML IV SOLN
1.0000 mg | INTRAVENOUS | Status: DC | PRN
  Administered 2018-03-04 – 2018-03-05 (×4): 1 mg via INTRAVENOUS
  Filled 2018-03-03 (×4): qty 1

## 2018-03-03 MED ORDER — METOCLOPRAMIDE HCL 5 MG/ML IJ SOLN
5.0000 mg | Freq: Once | INTRAMUSCULAR | Status: AC
  Administered 2018-03-03: 5 mg via INTRAVENOUS
  Filled 2018-03-03: qty 2

## 2018-03-03 NOTE — ED Triage Notes (Signed)
Pt arrives via EMS from home with reports of abd pain with NVD. afib 110 upon arrival. Pt seen at pcp for the same. Pt with complaints of dizziness. Hx 5 bypass. 150/85, 95% RA, CBG 107, HR 110 afib rvr. 18g :LFA.

## 2018-03-03 NOTE — ED Notes (Signed)
Did EKG. Gave to Dr. Lenna Sciara

## 2018-03-03 NOTE — ED Notes (Signed)
Pt with 1 bout of emesis

## 2018-03-03 NOTE — ED Notes (Signed)
Pt reports starting new prescription for tramadol last night.

## 2018-03-03 NOTE — Progress Notes (Signed)
ANTICOAGULATION CONSULT NOTE - Initial Consult  Pharmacy Consult for Heparin Indication: atrial fibrillation - [place] for apixaban  No Known Allergies  Patient Measurements: Height: 5' (152.4 cm) Weight: 126 lb (57.2 kg) IBW/kg (Calculated) : 45.5 Heparin Dosing Weight: na  Vital Signs: Temp: 97.9 F (36.6 C) (05/24 1133) Temp Source: Rectal (05/24 1133) BP: 158/85 (05/24 1600) Pulse Rate: 73 (05/24 1500)  Labs: Recent Labs    03/03/18 1118  HGB 12.2  HCT 39.5  PLT 269  CREATININE 1.16*   Estimated Creatinine Clearance: 29.1 mL/min (A) (by C-G formula based on SCr of 1.16 mg/dL (H)).  Medical History: Past Medical History:  Diagnosis Date  . CAD (coronary artery disease)    multivessel  . Hiatal hernia   . Hypercholesterolemia   . Hypertension   . Iron deficiency   . Osteopenia   . Vitamin B12 deficiency    Assessment: 84yo female presents with c/o abdominal pain and CT results noted for mass.  Pharmacy has been asked to dose her IV heparin therapy while work-up is in progress.  She is on oral anticoagulation with apixaban prior to admit at renal dose adjusted 2.5mg  bid.  Reviewing labs and her H/H and platelets are stable and no noted bleeding.  She has some age related renal decline with an estimated crcl ~ 34ml/min.     Spoke with patient and confirmed that she remembers taking her apixaban yesterday but has had nothing today.  Goal of Therapy:  Heparin level 0.3-0.7 units/ml  Monitor platelets by anticoagulation protocol: Yes   Plan:  - Heparin 2000 units IV bolus x 1 - Heparin infusion at 800 units/hr - Check 6-8 hour level and adjust accordingly  Rober Minion, PharmD., MS Clinical Pharmacist Pager:  [phone] Thank you for allowing pharmacy to be part of this patients care team. 03/03/2018,4:50 PM

## 2018-03-03 NOTE — H&P (Signed)
HISTORY AND PHYSICAL       PATIENT DETAILS Name: [name] [name] Age: 84 y.o. Sex: female Date of Birth: May 15, 1932 Admit Date: 03/03/2018 PCP:Kim, Jeneen Rinks, MD   Patient coming from: Home   CHIEF COMPLAINT:  Right lower quadrant pain x2 weeks  HPI: [name] [name] is a 84 y.o. female with medical history significant of CAD status post remote CABG, atrial fibrillation on Eliquis, CVA without any residual deficits-presenting to the ED for evaluation of the above-noted complaints.  Per patient, approximately 2 weeks back-she started noticing pain in the right lower quadrant.  Pain initially resolved to over-the-counter medications-Tylenol/Motrin.  However pain gradually increased over the past few days.  Pain is dull, around 10/10 at its worse (per family) and without any radiation.  No particular aggravating factors, somewhat relieved by tramadol.  Patient does acknowledge some frequency of urination and dysuria.  Does not have any flank or back pain.  Patient went to her primary care doctor a few days back due to worsening right lower quadrant pain and was provided tramadol.  Unfortunately the pain continued to worsen, as a result the patient presented to the ED for further evaluation and treatment.  Associated with nausea, and one episode of vomiting earlier this morning.  ED Course:  In the ED-patient underwent a CT scan of of the abdomen/pelvis-showed a complex masses arising from the right adnexal region, with possible invasion/encasing of the right ureter and resultant right hydroureter and right sided hydronephrosis.  Note: Lives at: Home Mobility:  Independent Chronic Indwelling Foley:no   REVIEW OF SYSTEMS:  Constitutional:   No  weight loss, night sweats,  Fevers, chills, fatigue.  HEENT:    No headaches, Dysphagia,Tooth/dental problems,Sore throat,  No sneezing, itching, ear ache, nasal congestion, post nasal drip  Cardio-vascular: No chest pain,Orthopnea,  PND,lower extremity edema, anasarca, palpitations  GI:  No heartburn, indigestion, diarrhea, melena or hematochezia  Resp: No shortness of breath, cough, hemoptysis,plueritic chest pain.   Skin:  No rash or lesions.  GU:  No  change in color of urine.No flank pain.  Musculoskeletal: No joint pain or swelling.  No decreased range of motion.  No back pain.  Endocrine: No heat intolerance, no cold intolerance, no polyuria, no polydipsia  Psych: No change in mood or affect. No depression or anxiety.  No memory loss.   ALLERGIES:  No Known Allergies  PAST MEDICAL HISTORY: Past Medical History:  Diagnosis Date  . CAD (coronary artery disease)    multivessel  . Hiatal hernia   . Hypercholesterolemia   . Hypertension   . Iron deficiency   . Osteopenia   . Vitamin B12 deficiency     PAST SURGICAL HISTORY: Past Surgical History:  Procedure Laterality Date  . CORONARY ARTERY BYPASS GRAFT     x 5  . left breast biopsy    . partial hystercetomy    . spasmodic dysphonia      MEDICATIONS AT HOME: Prior to Admission medications   Medication Sig Start Date End Date Taking? Authorizing Provider  acetaminophen (TYLENOL) 500 MG tablet Take 500 mg by mouth every 6 (six) hours as needed for moderate pain (pain).     [provider]  amLODipine (NORVASC) 2.5 MG tablet Take 2.5 mg by mouth daily.  07/29/14   [provider]  atorvastatin (LIPITOR) 10 MG tablet TAKE 1 TABLET (10 MG TOTAL) BY MOUTH DAILY AT 6 PM. 04/14/16   Rosalin Hawking, MD  ELIQUIS 2.5  MG TABS tablet TAKE 1 TABLET BY MOUTH TWICE A DAY 05/07/16   Rosalin Hawking, MD  metoprolol (LOPRESSOR) 50 MG tablet Take 50 mg by mouth 2 (two) times daily. 07/16/14   [provider]  nitroGLYCERIN (NITROSTAT) 0.4 MG SL tablet Place 0.4 mg under the tongue every 5 (five) minutes as needed for chest pain.    [provider]  OnabotulinumtoxinA (BOTOX IJ) Inject as directed every 3 (three) months.     [provider]  predniSONE (DELTASONE) 1 MG tablet Take 2 mg by mouth daily with breakfast.    [provider]  sertraline (ZOLOFT) 50 MG tablet Take 50 mg by mouth daily. 02/23/15   [provider]    FAMILY HISTORY: Family History  Problem Relation Age of Onset  . Stroke Father   . Heart attack Mother      SOCIAL HISTORY:  reports that she has quit smoking. She has never used smokeless tobacco. She reports that she does not drink alcohol or use drugs.  PHYSICAL EXAM: Blood pressure (!) 158/85, pulse 73, temperature 97.9 F (36.6 C), temperature source Rectal, resp. rate (!) 21, height 5' (1.524 m), weight 57.2 kg (126 lb), SpO2 93 %.  General appearance :Awake, alert, not in any distress. Speech Clear. Eyes:, pupils equally reactive to light and accomodation,no scleral icterus.Pink conjunctiva HEENT: Atraumatic and Normocephalic Neck: supple, no JVD. No cervical lymphadenopathy. No thyromegaly Resp:Good air entry bilaterally, no added sounds  CVS: S1 S2 regular, no murmurs.  GI: Bowel sounds present, mild to moderate tender in the right lower quadrant without any peritoneal signs.  Extremities: B/L Lower Ext shows no edema, both legs are warm to touch Neurology:  speech clear,Non focal, sensation is grossly intact. Psychiatric: Normal judgment and insight. Alert and oriented x 3. Normal mood. Musculoskeletal:gait appears to be normal.No digital cyanosis Skin:No Rash, warm and dry Wounds:N/A  LABS ON ADMISSION:  I have personally reviewed following labs and imaging studies  CBC: Recent Labs  Lab 03/03/18 1118  WBC 10.2  HGB 12.2  HCT 39.5  MCV 87.2  PLT 027    Basic Metabolic Panel: Recent Labs  Lab 03/03/18 1118  NA 139  K 3.5  CL 103  CO2 26  GLUCOSE 120*  BUN 21*  CREATININE 1.16*  CALCIUM 9.5    GFR: Estimated Creatinine Clearance: 29.1 mL/min (A) (by C-G formula based on SCr of 1.16 mg/dL (H)).  Liver Function  Tests: Recent Labs  Lab 03/03/18 1118  AST 19  ALT 11*  ALKPHOS 55  BILITOT 0.7  PROT 6.9  ALBUMIN 3.8   Recent Labs  Lab 03/03/18 1118  LIPASE 31   No results for input(s): AMMONIA in the last 168 hours.  Coagulation Profile: No results for input(s): INR, PROTIME in the last 168 hours.  Cardiac Enzymes: No results for input(s): CKTOTAL, CKMB, CKMBINDEX, TROPONINI in the last 168 hours.  BNP (last 3 results) No results for input(s): PROBNP in the last 8760 hours.  HbA1C: No results for input(s): HGBA1C in the last 72 hours.  CBG: No results for input(s): GLUCAP in the last 168 hours.  Lipid Profile: No results for input(s): CHOL, HDL, LDLCALC, TRIG, CHOLHDL, LDLDIRECT in the last 72 hours.  Thyroid Function Tests: No results for input(s): TSH, T4TOTAL, FREET4, T3FREE, THYROIDAB in the last 72 hours.  Anemia Panel: No results for input(s): VITAMINB12, FOLATE, FERRITIN, TIBC, IRON, RETICCTPCT in the last 72 hours.  Urine analysis:    Component Value  Date/Time   COLORURINE STRAW (A) 03/03/2018 1250   APPEARANCEUR HAZY (A) 03/03/2018 1250   LABSPEC 1.014 03/03/2018 1250   PHURINE 7.0 03/03/2018 1250   GLUCOSEU NEGATIVE 03/03/2018 1250   HGBUR NEGATIVE 03/03/2018 1250   BILIRUBINUR NEGATIVE 03/03/2018 1250   KETONESUR 5 (A) 03/03/2018 1250   PROTEINUR NEGATIVE 03/03/2018 1250   UROBILINOGEN 0.2 03/28/2007 1924   NITRITE NEGATIVE 03/03/2018 1250   LEUKOCYTESUR SMALL (A) 03/03/2018 1250    Sepsis Labs: Lactic Acid, Venous No results found for: [place]   Microbiology: No results found for this or any previous visit (from the past 240 hour(s)).    RADIOLOGIC STUDIES ON ADMISSION: Ct Abdomen Pelvis W Contrast  Result Date: 03/03/2018 CLINICAL DATA:  Abdominal pain, primarily on the right EXAM: CT ABDOMEN AND PELVIS WITH CONTRAST TECHNIQUE: Multidetector CT imaging of the abdomen and pelvis was performed using the standard protocol following bolus  administration of intravenous contrast. Oral contrast was also administered. CONTRAST:  61mL OMNIPAQUE IOHEXOL 300 MG/ML  SOLN COMPARISON:  Abdominal ultrasound August 19, 2014 FINDINGS: Lower chest: There is bibasilar atelectasis. There are foci of coronary artery calcification. There is a sizable hiatal type hernia. Hepatobiliary: No focal liver lesions are evident on this noncontrast enhanced study. Gallbladder wall is not appreciably thickened. There is no biliary duct dilatation. Pancreas: There is no well-defined mass seen in the pancreas. No inflammatory focus evident. There are benign-appearing calcifications in the body of the pancreas which may represent residua of prior pancreatitis. No pancreatic duct dilatation evident. Spleen: No splenic lesions evident. Adrenals/Urinary Tract: Adrenals bilaterally appear normal. There is no renal mass on either side. There is decreased enhancement of the right kidney compared to the left kidney. There is severe hydronephrosis on the right. No hydronephrosis is evident on the left. There is no renal or ureteral calculus on either side. There is diffuse dilatation of the left ureter to its distal aspect. In the region of the distal left ureter, there is wall thickening with what appears to be a right adnexal mass either impressing upon or frankly invading the right distal ureter. Urinary bladder is midline with wall thickness within normal limits. Stomach/Bowel: There is no appreciable bowel wall or mesenteric thickening. No evident bowel obstruction. No free air or portal venous air. Vascular/Lymphatic: There is atherosclerotic calcification in the aorta and common iliac arteries. There is also common femoral artery atherosclerosis bilaterally. No aneurysm evident. Major mesenteric arterial vessels appear patent. There is no appreciable adenopathy in the abdomen or pelvis. Reproductive: Uterus absent. There is a complex mass in the right adnexal region containing  calcification. This mass measures 5.0 x 4.9 x 4.2 cm. There is enlargement of the left ovary with mixed attenuation measuring 4.8 x 4.2 x 3.9 cm. Other: There is a mass in the right adnexal region immediately adjacent to the larger mass arising from the right ovary measuring 2.5 x 2.4 x 2.4 cm. This mass may well represent an omental lesion immediately adjacent to the right ovary which is either invading or impressing upon the distal right ureter. No similar lesions elsewhere. No abscess or ascites is present in the abdomen or pelvis. Appendix not seen. It is conceivable that the mass in the right adnexal region represents an appendiceal neoplasm as opposed to an ovarian neoplasm. Musculoskeletal: There are pars defects bilaterally at L5 with 7 mm of anterolisthesis of L5 on S1. There is degenerative change in the lumbar spine. There are no blastic or lytic bone lesions. There  is no intramuscular or abdominal wall lesion. IMPRESSION: 1. There are complex masses in the right adnexal region. One of these masses either extends into or compresses the distal right ureter. The larger of these right adnexal masses measures 5.0 x 4.9 x 4.2 cm. There is an adjacent mass measuring 2.5 x 2.4 x 2.4 cm. The appearance is concerning for ovarian neoplastic lesions on the right, possibly with an immediately adjacent omental lesion impressing upon or invading the right ureter. It should be noted that a normal appendix is not seen. A neoplastic lesion involving the appendix located in the right adnexal region must be considered a differential consideration. There is no inflammation in these areas. 2. Enlarged left ovary with inhomogeneous appearance. Concern for left ovarian neoplasm also present. 3. Severe hydronephrosis and ureterectasis on the right with decreased enhancement right kidney compared to the left consistent with the obstructing focus in the distal ureter region on the right. 4. No bowel obstruction. No abscess. No  renal or ureteral calculi. No hydronephrosis. 5.  No adenopathy or liver lesions evident. 6. Aortoiliac atherosclerosis. Femoral artery atherosclerosis. There is coronary artery calcification as well. 7.  Pars defects at L5 bilaterally with spondylolisthesis at L5-S1. 8.  Uterus absent. 9.  Sizable hiatal type hernia. Comment: Pelvic MR nonemergently may be helpful for further delineation of the lesions in the right adnexal region. Aortic Atherosclerosis (ICD10-I70.0). Electronically Signed   By: Lowella Grip III M.D.   On: 03/03/2018 14:25     EKG:  Personally reviewed.  Atrial fibrillation with rate in the 80s  ASSESSMENT AND PLAN: Intractable right lower quadrant abdominal pain: Either from the adnexal mass itself or due to invasion/encasing of the right ureter and resultant hydroureter and hydronephrosis.  Patient will be provided supportive care-as needed narcotics and antiemetics.  Have consulted both urology to evaluate and decide how to proceed next.  Her last dose of Eliquis was yesterday evening, will continue to hold Eliquis-and just place her on a heparin infusion-as her CHA2DS2-VASc score of around 6.  Right adnexal mass with resultant right hydroureter/hydronephrosis: As noted above-urology and GYN oncology have been consulted.  Dr. Gerarda Fraction recommended we obtain a CEA, CA 125, pelvic/transvaginal ultrasound.  She will evaluate tomorrow and await further recommendations.  Patient will also be evaluated by urology tomorrow to see if patient needs stenting/decompression of the right kidney.  Chronic atrial fibrillation: Continue metoprolol-holding Eliquis-placing her on a IV heparin infusion.  See above.  CVA: No residual deficits on exam-given her high CHA2DS2-VASc score-placing patient on IV heparin-holding Eliquis for now  History of CAD status post CABG: No anginal symptoms  Further plan will depend as patient's clinical course evolves and further radiologic and laboratory data  become available. Patient will be monitored closely.  Above noted plan was discussed with patient/daughter face to face at bedside, they were in agreement.   CONSULTS: None  DVT Prophylaxis: IV Heparin  Code Status: Full Code  Disposition Plan:  Discharge back home  possibly in 2-3 days, depending on clinical course  Admission status: Inpatient going to tele  The medical decision making on this patient was of high complexity and the patient is at high risk for clinical deterioration, therefore this is a level 3 visit.  Total time spent 65 minutes.Greater than 50% of this time was spent in counseling, explanation of diagnosis, planning of further management, and coordination of care.  Oren Binet [facility] Pager [phone]  If 7PM-7AM, please contact night-coverage [email or web address] Password [email or web address]  03/03/2018, 4:18 PM

## 2018-03-03 NOTE — ED Notes (Signed)
Patient transported to CT 

## 2018-03-03 NOTE — Progress Notes (Signed)
[name] [name] is a 84 y.o. female patient admitted from ED awake, alert - oriented  X 4 - no acute distress noted.  VSS - Blood pressure (!) 138/93, pulse 91, temperature 97.9 F (36.6 C), temperature source Rectal, resp. rate (!) 21, height 5' (1.524 m), weight 57.2 kg (126 lb), SpO2 94 %.    IV in place, occlusive dsg intact without redness.  Orientation to room, and floor completed with information packet given to patient/family.  Patient declined safety video at this time.  Admission INP armband ID verified with patient/family, and in place.   SR up x 2, fall assessment complete, with patient and family able to verbalize understanding of risk associated with falls, and verbalized understanding to call nsg before up out of bed.  Call light within reach, patient able to voice, and demonstrate understanding.  Skin, clean-dry- intact without evidence of bruising, or skin tears.   No evidence of skin break down noted on exam.     Will cont to eval and treat per MD orders.  Celine Ahr, RN 03/03/2018 6:56 PM

## 2018-03-03 NOTE — ED Provider Notes (Addendum)
[facility] EMERGENCY DEPARTMENT Provider Note   CSN: [id number] Arrival date & time: 03/03/18  1102     History   Chief Complaint Chief Complaint  Patient presents with  . Abdominal Pain    HPI [name] [name] is a 84 y.o. female.  HPI Complains of diffuse abdominal pain intermittent for the past 2 weeks. She was seen by her primary care physiciandr. Kammfor same complaint.she presents today as pain continues. Symptoms accompanied by nausea and vomiting. She last vomited this morning. No blood. No urinary symptoms. I spent move yesterday, normal.no fever no other associated symptoms.denies chest pain. Other associated symptoms include lightheadedness and generalized weakness.. No blood per rectum. No other associated symptoms. No treatment prior to coming here. No nausea presently Past Medical History:  Diagnosis Date  . CAD (coronary artery disease)    multivessel  . Hiatal hernia   . Hypercholesterolemia   . Hypertension   . Iron deficiency   . Osteopenia   . Vitamin B12 deficiency     Patient Active Problem List   Diagnosis Date Noted  . Cerebral infarction due to embolism of right middle cerebral artery ([place]) 06/07/2014  . Pulmonary hypertension ([place]) 05/20/2014  . Atrial fibrillation ([place]) 04/30/2014  . Stroke with cerebral ischemia ([place]) 04/26/2014  . Coronary atherosclerosis of native coronary artery 11/14/2012  . Other and unspecified hyperlipidemia 11/14/2012  . Unspecified essential hypertension 11/06/2010    Past Surgical History:  Procedure Laterality Date  . CORONARY ARTERY BYPASS GRAFT     x 5  . left breast biopsy    . partial hystercetomy    . spasmodic dysphonia       OB History   None      Home Medications    Prior to Admission medications   Medication Sig Start Date End Date Taking? Authorizing Provider  acetaminophen (TYLENOL) 500 MG tablet Take 500 mg by mouth every 6 (six) hours as needed for moderate pain (pain).      [provider]  amLODipine (NORVASC) 2.5 MG tablet Take 2.5 mg by mouth daily.  07/29/14   [provider]  atorvastatin (LIPITOR) 10 MG tablet TAKE 1 TABLET (10 MG TOTAL) BY MOUTH DAILY AT 6 PM. 04/14/16   Rosalin Hawking, MD  ELIQUIS 2.5 MG TABS tablet TAKE 1 TABLET BY MOUTH TWICE A DAY 05/07/16   Rosalin Hawking, MD  metoprolol (LOPRESSOR) 50 MG tablet Take 50 mg by mouth 2 (two) times daily. 07/16/14   [provider]  nitroGLYCERIN (NITROSTAT) 0.4 MG SL tablet Place 0.4 mg under the tongue every 5 (five) minutes as needed for chest pain.    [provider]  OnabotulinumtoxinA (BOTOX IJ) Inject as directed every 3 (three) months.    [provider]  predniSONE (DELTASONE) 1 MG tablet Take 2 mg by mouth daily with breakfast.    [provider]  sertraline (ZOLOFT) 50 MG tablet Take 50 mg by mouth daily. 02/23/15   [provider]    Family History Family History  Problem Relation Age of Onset  . Stroke Father   . Heart attack Mother     Social History Social History   Tobacco Use  . Smoking status: Former Research scientist (life sciences)  . Smokeless tobacco: Never Used  Substance Use Topics  . Alcohol use: No    Alcohol/week: 0.0 oz  . Drug use: No     Allergies   Patient has no known allergies.   Review of Systems Review of  Systems  Constitutional: Negative.   HENT: Negative.   Respiratory: Negative.   Cardiovascular: Negative.   Gastrointestinal: Positive for abdominal pain and vomiting.  Musculoskeletal: Negative.   Skin: Negative.   Neurological: Positive for weakness.       Generalized weakness  Psychiatric/Behavioral: Negative.   All other systems reviewed and are negative.    Physical Exam Updated Vital Signs Ht 5' (1.524 m)   Wt 57.2 kg (126 lb)   BMI 24.61 kg/m   Physical Exam  Constitutional:  Frail appearing  HENT:  Head: Normocephalic and atraumatic.  Eyes: Pupils are equal, round, and reactive to light.  Conjunctivae are normal.  Neck: Neck supple. No tracheal deviation present. No thyromegaly present.  Cardiovascular: Normal rate.  No murmur heard. Irregularly irregular  Pulmonary/Chest: Effort normal and breath sounds normal.  Abdominal: Soft. Bowel sounds are normal. She exhibits no distension and no mass. There is tenderness. There is no guarding.  mildlyTender over up her quadrants bilaterally  Genitourinary:  Genitourinary Comments: Rectal normal tone round stool no gross blood  Musculoskeletal: Normal range of motion. She exhibits no edema or tenderness.  Neurological: She is alert. Coordination normal.  Skin: Skin is warm and dry. No rash noted.  Psychiatric: She has a normal mood and affect.  Nursing note and vitals reviewed.    ED Treatments / Results  Labs (all labs ordered are listed, but only abnormal results are displayed) Labs Reviewed  LIPASE, BLOOD  COMPREHENSIVE METABOLIC PANEL  CBC  URINALYSIS, ROUTINE W REFLEX MICROSCOPIC  I-STAT TROPONIN, ED    EKG EKG Interpretation  Date/Time:  Friday Mar 03 2018 11:10:29 EDT Ventricular Rate:  85 PR Interval:    QRS Duration: 84 QT Interval:  329 QTC Calculation: 392 R Axis:   80 Text Interpretation:  Atrial fibrillation Nonspecific repol abnormality, diffuse leads No significant change since last tracing Confirmed by Orlie Dakin [phone]) on 03/03/2018 11:14:04 AM   Radiology No results found.  Procedures Procedures (including critical care time)  Medications Ordered in ED Medications - No data to display   Initial Impression / Assessment and Plan / ED Course  I have reviewed the triage vital signs and the nursing notes.  Pertinent labs & imaging results that were available during my care of the patient were reviewed by me and considered in my medical decision making (see chart for details).     2:40 PM after treatment with IV normal saline bolus patient became lightheaded on standing and vomited.  Additional IV bolus normal saline 1 L ordered as well as Reglan intravenously.  3:45 PM nausea is improved. Patient resting comfortably in bed. I've consulted hospitalist service who will arrange for overnight stay. Palliative care consult ordered Results for orders placed or performed during the hospital encounter of 03/03/18  Lipase, blood  Result Value Ref Range   Lipase 31 11 - 51 U/L  Comprehensive metabolic panel  Result Value Ref Range   Sodium 139 135 - 145 mmol/L   Potassium 3.5 3.5 - 5.1 mmol/L   Chloride 103 101 - 111 mmol/L   CO2 26 22 - 32 mmol/L   Glucose, Bld 120 (H) 65 - 99 mg/dL   BUN 21 (H) 6 - 20 mg/dL   Creatinine, Ser 1.16 (H) 0.44 - 1.00 mg/dL   Calcium 9.5 8.9 - 10.3 mg/dL   Total Protein 6.9 6.5 - 8.1 g/dL   Albumin 3.8 3.5 - 5.0 g/dL   AST 19 15 - 41 U/L  ALT 11 (L) 14 - 54 U/L   Alkaline Phosphatase 55 38 - 126 U/L   Total Bilirubin 0.7 0.3 - 1.2 mg/dL   GFR calc non Af Amer 42 (L) >60 mL/min   GFR calc Af Amer 49 (L) >60 mL/min   Anion gap 10 5 - 15  CBC  Result Value Ref Range   WBC 10.2 4.0 - 10.5 K/uL   RBC 4.53 3.87 - 5.11 MIL/uL   Hemoglobin 12.2 12.0 - 15.0 g/dL   HCT 39.5 36.0 - 46.0 %   MCV 87.2 78.0 - 100.0 fL   MCH 26.9 26.0 - 34.0 pg   MCHC 30.9 30.0 - 36.0 g/dL   RDW 13.3 11.5 - 15.5 %   Platelets 269 150 - 400 K/uL  Urinalysis, Routine w reflex microscopic  Result Value Ref Range   Color, Urine STRAW (A) YELLOW   APPearance HAZY (A) CLEAR   Specific Gravity, Urine 1.014 1.005 - 1.030   pH 7.0 5.0 - 8.0   Glucose, UA NEGATIVE NEGATIVE mg/dL   Hgb urine dipstick NEGATIVE NEGATIVE   Bilirubin Urine NEGATIVE NEGATIVE   Ketones, ur 5 (A) NEGATIVE mg/dL   Protein, ur NEGATIVE NEGATIVE mg/dL   Nitrite NEGATIVE NEGATIVE   Leukocytes, UA SMALL (A) NEGATIVE   RBC / HPF 0-5 0 - 5 RBC/hpf   WBC, UA 0-5 0 - 5 WBC/hpf   Bacteria, UA RARE (A) NONE SEEN   Squamous Epithelial / LPF 6-10 0 - 5  I-Stat Troponin, ED (not at [facility])  Result  Value Ref Range   Troponin i, poc 0.00 0.00 - 0.08 ng/mL   Comment 3          POC occult blood, ED Provider will collect  Result Value Ref Range   Fecal Occult Bld NEGATIVE NEGATIVE   Ct Abdomen Pelvis W Contrast  Result Date: 03/03/2018 CLINICAL DATA:  Abdominal pain, primarily on the right EXAM: CT ABDOMEN AND PELVIS WITH CONTRAST TECHNIQUE: Multidetector CT imaging of the abdomen and pelvis was performed using the standard protocol following bolus administration of intravenous contrast. Oral contrast was also administered. CONTRAST:  81mL OMNIPAQUE IOHEXOL 300 MG/ML  SOLN COMPARISON:  Abdominal ultrasound August 19, 2014 FINDINGS: Lower chest: There is bibasilar atelectasis. There are foci of coronary artery calcification. There is a sizable hiatal type hernia. Hepatobiliary: No focal liver lesions are evident on this noncontrast enhanced study. Gallbladder wall is not appreciably thickened. There is no biliary duct dilatation. Pancreas: There is no well-defined mass seen in the pancreas. No inflammatory focus evident. There are benign-appearing calcifications in the body of the pancreas which may represent residua of prior pancreatitis. No pancreatic duct dilatation evident. Spleen: No splenic lesions evident. Adrenals/Urinary Tract: Adrenals bilaterally appear normal. There is no renal mass on either side. There is decreased enhancement of the right kidney compared to the left kidney. There is severe hydronephrosis on the right. No hydronephrosis is evident on the left. There is no renal or ureteral calculus on either side. There is diffuse dilatation of the left ureter to its distal aspect. In the region of the distal left ureter, there is wall thickening with what appears to be a right adnexal mass either impressing upon or frankly invading the right distal ureter. Urinary bladder is midline with wall thickness within normal limits. Stomach/Bowel: There is no appreciable bowel wall or mesenteric  thickening. No evident bowel obstruction. No free air or portal venous air. Vascular/Lymphatic: There is atherosclerotic calcification in the  aorta and common iliac arteries. There is also common femoral artery atherosclerosis bilaterally. No aneurysm evident. Major mesenteric arterial vessels appear patent. There is no appreciable adenopathy in the abdomen or pelvis. Reproductive: Uterus absent. There is a complex mass in the right adnexal region containing calcification. This mass measures 5.0 x 4.9 x 4.2 cm. There is enlargement of the left ovary with mixed attenuation measuring 4.8 x 4.2 x 3.9 cm. Other: There is a mass in the right adnexal region immediately adjacent to the larger mass arising from the right ovary measuring 2.5 x 2.4 x 2.4 cm. This mass may well represent an omental lesion immediately adjacent to the right ovary which is either invading or impressing upon the distal right ureter. No similar lesions elsewhere. No abscess or ascites is present in the abdomen or pelvis. Appendix not seen. It is conceivable that the mass in the right adnexal region represents an appendiceal neoplasm as opposed to an ovarian neoplasm. Musculoskeletal: There are pars defects bilaterally at L5 with 7 mm of anterolisthesis of L5 on S1. There is degenerative change in the lumbar spine. There are no blastic or lytic bone lesions. There is no intramuscular or abdominal wall lesion. IMPRESSION: 1. There are complex masses in the right adnexal region. One of these masses either extends into or compresses the distal right ureter. The larger of these right adnexal masses measures 5.0 x 4.9 x 4.2 cm. There is an adjacent mass measuring 2.5 x 2.4 x 2.4 cm. The appearance is concerning for ovarian neoplastic lesions on the right, possibly with an immediately adjacent omental lesion impressing upon or invading the right ureter. It should be noted that a normal appendix is not seen. A neoplastic lesion involving the appendix  located in the right adnexal region must be considered a differential consideration. There is no inflammation in these areas. 2. Enlarged left ovary with inhomogeneous appearance. Concern for left ovarian neoplasm also present. 3. Severe hydronephrosis and ureterectasis on the right with decreased enhancement right kidney compared to the left consistent with the obstructing focus in the distal ureter region on the right. 4. No bowel obstruction. No abscess. No renal or ureteral calculi. No hydronephrosis. 5.  No adenopathy or liver lesions evident. 6. Aortoiliac atherosclerosis. Femoral artery atherosclerosis. There is coronary artery calcification as well. 7.  Pars defects at L5 bilaterally with spondylolisthesis at L5-S1. 8.  Uterus absent. 9.  Sizable hiatal type hernia. Comment: Pelvic MR nonemergently may be helpful for further delineation of the lesions in the right adnexal region. Aortic Atherosclerosis (ICD10-I70.0). Electronically Signed   By: Lowella Grip III M.D.   On: 03/03/2018 14:25  lab work remarkable for mild renal insufficiency Final Clinical Impressions(s) / ED Diagnoses  Diagnosis #1 adnexal masses #2 abdominal pain #3 nausea and vomiting Final diagnoses:  None   #4 hydronephrosis ED Discharge Orders    None      #5 renal insufficiency Orlie Dakin, MD 03/03/18 3300    Orlie Dakin, MD 03/03/18 1553

## 2018-03-04 ENCOUNTER — Inpatient Hospital Stay (HOSPITAL_COMMUNITY): Payer: Medicare Other

## 2018-03-04 ENCOUNTER — Encounter (HOSPITAL_COMMUNITY): Payer: Self-pay | Admitting: Obstetrics

## 2018-03-04 DIAGNOSIS — R110 Nausea: Secondary | ICD-10-CM

## 2018-03-04 DIAGNOSIS — K921 Melena: Secondary | ICD-10-CM

## 2018-03-04 DIAGNOSIS — R970 Elevated carcinoembryonic antigen [CEA]: Secondary | ICD-10-CM

## 2018-03-04 LAB — BASIC METABOLIC PANEL
ANION GAP: 8 (ref 5–15)
BUN: 14 mg/dL (ref 6–20)
CALCIUM: 9 mg/dL (ref 8.9–10.3)
CO2: 25 mmol/L (ref 22–32)
CREATININE: 1.19 mg/dL — AB (ref 0.44–1.00)
Chloride: 105 mmol/L (ref 101–111)
GFR, EST AFRICAN AMERICAN: 48 mL/min — AB (ref >60)
GFR, EST NON AFRICAN AMERICAN: 41 mL/min — AB (ref >60)
Glucose, Bld: 113 mg/dL — ABNORMAL HIGH (ref 65–99)
Potassium: 4 mmol/L (ref 3.5–5.1)
Sodium: 138 mmol/L (ref 135–145)

## 2018-03-04 LAB — CBC
HCT: 37.4 % (ref 36.0–46.0)
HEMOGLOBIN: 11.6 g/dL — AB (ref 12.0–15.0)
MCH: 27.3 pg (ref 26.0–34.0)
MCHC: 31 g/dL (ref 30.0–36.0)
MCV: 88 fL (ref 78.0–100.0)
PLATELETS: 269 10*3/uL (ref 150–400)
RBC: 4.25 MIL/uL (ref 3.87–5.11)
RDW: 13.3 % (ref 11.5–15.5)
WBC: 9.9 10*3/uL (ref 4.0–10.5)

## 2018-03-04 LAB — APTT
aPTT: 52 seconds — ABNORMAL HIGH (ref 24–36)
aPTT: 93 seconds — ABNORMAL HIGH (ref 24–36)

## 2018-03-04 LAB — HEPARIN LEVEL (UNFRACTIONATED): HEPARIN UNFRACTIONATED: 1.2 [IU]/mL — AB (ref 0.30–0.70)

## 2018-03-04 LAB — CA 125: CANCER ANTIGEN (CA) 125: 29 U/mL (ref 0.0–38.1)

## 2018-03-04 LAB — PROTIME-INR
INR: 1.06
Prothrombin Time: 13.7 seconds (ref 11.4–15.2)

## 2018-03-04 LAB — CEA: CEA: 10.3 ng/mL — ABNORMAL HIGH (ref 0.0–4.7)

## 2018-03-04 MED ORDER — PEG 3350-KCL-NA BICARB-NACL 420 G PO SOLR
4000.0000 mL | Freq: Once | ORAL | Status: AC
  Administered 2018-03-06: 4000 mL via ORAL
  Filled 2018-03-04 (×3): qty 4000

## 2018-03-04 MED ORDER — SODIUM CHLORIDE 0.9 % IV SOLN
INTRAVENOUS | Status: DC
Start: 2018-03-04 — End: 2018-03-11
  Administered 2018-03-04: 18:00:00 via INTRAVENOUS

## 2018-03-04 MED ORDER — PANTOPRAZOLE SODIUM 40 MG IV SOLR
40.0000 mg | Freq: Two times a day (BID) | INTRAVENOUS | Status: DC
  Administered 2018-03-04 – 2018-03-09 (×11): 40 mg via INTRAVENOUS
  Filled 2018-03-04 (×14): qty 40

## 2018-03-04 NOTE — Progress Notes (Signed)
ANTICOAGULATION CONSULT NOTE - Follow up [place] for Heparin Indication: atrial fibrillation - apixaban on hold  No Known Allergies  Patient Measurements: Height: 5' (152.4 cm) Weight: 126 lb (57.2 kg) IBW/kg (Calculated) : 45.5 Heparin Dosing Weight: 57  Vital Signs: Temp: 99 F (37.2 C) (05/24 2138) Temp Source: Oral (05/24 2138) BP: 154/88 (05/24 2138) Pulse Rate: 122 (05/24 2138)  Labs: Recent Labs    03/03/18 1118 03/03/18 2329 03/03/18 2345  HGB 12.2  --   --   HCT 39.5  --   --   PLT 269  --   --   APTT  --   --  52*  HEPARINUNFRC  --  1.20*  --   CREATININE 1.16*  --   --    Estimated Creatinine Clearance: 29.1 mL/min (A) (by C-G formula based on SCr of 1.16 mg/dL (H)).  Medical History: Past Medical History:  Diagnosis Date  . CAD (coronary artery disease)    multivessel  . Hiatal hernia   . Hypercholesterolemia   . Hypertension   . Iron deficiency   . Osteopenia   . Vitamin B12 deficiency    Assessment: 84yo female presents with c/o abdominal pain and CT results noted for mass.  Pharmacy has been asked to dose her IV heparin therapy while work-up is in progress.  She is on oral anticoagulation with apixaban prior to admit at renal dose adjusted 2.5mg  bid.  Reviewing labs and her H/H and platelets are stable and no noted bleeding.  She has some age related renal decline with an estimated crcl ~ 23ml/min.   Last apixaban dose on 5/24.  Heparin level is not correlating with aPTT currently d/t apixaban.  HL SUPRAtherapeutic at 1.2 and aPTT SUBtherapeutic at 52.   Goal of Therapy:  Heparin level 0.3-0.7 units/ml  APTT 66-102 Monitor platelets by anticoagulation protocol: Yes   Plan:  Increase Heparin infusion to 900 units/hr Check aPTT in 8 hours Check anti-Xa level/aPTT daily while on heparin Continue to monitor H&H and platelets   Thank you for allowing [place] to participate in this patients care.  Jens Som, PharmD Main  pharmacy at: [phone] 03/04/2018 3:51 AM

## 2018-03-04 NOTE — Progress Notes (Addendum)
ANTICOAGULATION CONSULT NOTE - Follow up [place] for Heparin Indication: atrial fibrillation - apixaban on hold  No Known Allergies  Patient Measurements: Height: 5' (152.4 cm) Weight: 126 lb (57.2 kg) IBW/kg (Calculated) : 45.5 Heparin Dosing Weight: 57  Vital Signs: Temp: 98.3 F (36.8 C) (05/25 0449) Temp Source: Oral (05/25 0449) BP: 157/73 (05/25 0449) Pulse Rate: 67 (05/25 0449)  Labs: Recent Labs    03/03/18 1118 03/03/18 2329 03/03/18 2345 03/04/18 0356 03/04/18 1205  HGB 12.2  --   --  11.6*  --   HCT 39.5  --   --  37.4  --   PLT 269  --   --  269  --   APTT  --   --  52*  --  93*  LABPROT  --   --   --  13.7  --   INR  --   --   --  1.06  --   HEPARINUNFRC  --  1.20*  --   --   --   CREATININE 1.16*  --   --  1.19*  --    Estimated Creatinine Clearance: 28.4 mL/min (A) (by C-G formula based on SCr of 1.19 mg/dL (H)).  Medical History: Past Medical History:  Diagnosis Date  . CAD (coronary artery disease)    multivessel  . Hiatal hernia   . Hypercholesterolemia   . Hypertension   . Iron deficiency   . Osteopenia   . Vitamin B12 deficiency    Assessment: 84yo female presents with c/o abdominal pain and CT results noted for mass.  Pharmacy has been asked to dose her IV heparin therapy while work-up is in progress.  She is on oral anticoagulation with apixaban prior to admit at renal dose adjusted 2.5mg  bid.  CBC stable with no signs/sx bleeding, but of note, started protonix IV BID. Monitor for any bleeding. She has some age related renal decline with an estimated crcl ~ 69ml/min.   Last apixaban dose on 5/24.  Heparin level is not correlating with aPTT currently d/t apixaban.  Heparin level supratherapeutic at 1.2 and aPTT now therapeutic, 93 seconds after rate increase   Goal of Therapy:  Heparin level 0.3-0.7 units/ml  APTT 66-102 Monitor platelets by anticoagulation protocol: Yes   Plan:  Continue Heparin infusion 900  units/hr Check anti-Xa level/aPTT daily while on heparin Continue to monitor H&H and platelets  Nida Boatman, PharmD PGY1 Acute Care Pharmacy Resident 03/04/2018 1:14 PM

## 2018-03-04 NOTE — H&P (View-Only) (Signed)
Referring Provider: Dr. Tyrell Antonio Primary Care Physician:  Jani Gravel, MD Primary Gastroenterologist:  Althia Forts  Reason for Consultation:  Abdominal pain; GI bleed  HPI: [name] [name] is a 84 y.o. female with multiple medical problems seen for a consult due to abdominal pain and melena. Reports black stools intermittently for a month and does not know when her last episode of black stools was but last BM prior to admit was Thursday and was brown. Has been having RLQ, right-side and right flank pain for the past 2 weeks. Pain is dull and severe at times. CT shows a right adnexal mass and enlarged left ovary concerning for a neoplasm. Reports last colonoscopy over 10 years ago and records not known. Daughter in room. Prior to admit was on Elquis for Afib and also has a history of a stroke. Currently on Heparin IV drip. Hgb 11.6. CEA elevated at 10.3. CA-125 29. Hemoccult negative.  Past Medical History:  Diagnosis Date  . CAD (coronary artery disease)    multivessel  . H/O atrial fibrillation   . H/O: CVA (cerebrovascular accident)    presented 19 with slurred speech. No residual deficit  . Hiatal hernia   . Hypercholesterolemia   . Hypertension   . Iron deficiency   . Osteopenia   . Spasmodic dysphonia   . Vitamin B12 deficiency     Past Surgical History:  Procedure Laterality Date  . CORONARY ARTERY BYPASS GRAFT     x 5  . left breast biopsy    . spasmodic dysphonia    . VAGINAL HYSTERECTOMY     for prolapse    Prior to Admission medications   Medication Sig Start Date End Date Taking? Authorizing Provider  acetaminophen (TYLENOL) 500 MG tablet Take 500 mg by mouth every 6 (six) hours as needed for moderate pain (pain).    Yes [provider]  amLODipine (NORVASC) 2.5 MG tablet Take 2.5 mg by mouth daily.  07/29/14  Yes [provider]  atorvastatin (LIPITOR) 10 MG tablet TAKE 1 TABLET (10 MG TOTAL) BY MOUTH DAILY AT 6 PM. 04/14/16  Yes Rosalin Hawking, MD   carboxymethylcellulose (REFRESH PLUS) 0.5 % SOLN Place 1 drop into both eyes as needed (dry eyes).   Yes [provider]  ELIQUIS 2.5 MG TABS tablet TAKE 1 TABLET BY MOUTH TWICE A DAY 05/07/16  Yes Rosalin Hawking, MD  metoprolol (LOPRESSOR) 50 MG tablet Take 50 mg by mouth 2 (two) times daily. 07/16/14  Yes [provider]  nitroGLYCERIN (NITROSTAT) 0.4 MG SL tablet Place 0.4 mg under the tongue every 5 (five) minutes as needed for chest pain.   Yes [provider]  OnabotulinumtoxinA (BOTOX IJ) Inject as directed every 3 (three) months.   Yes [provider]  traMADol (ULTRAM) 50 MG tablet Take 50 mg by mouth every 6 (six) hours as needed for pain. 03/02/18  Yes [provider]  levothyroxine (SYNTHROID, LEVOTHROID) 25 MCG tablet Take 25 mcg by mouth daily. Per Physician take once weekly 03/02/18   [provider]  predniSONE (DELTASONE) 1 MG tablet Take 2 mg by mouth daily with breakfast.    [provider]  sertraline (ZOLOFT) 50 MG tablet Take 50 mg by mouth daily. 02/23/15   [provider]    Scheduled Meds: . atorvastatin  10 mg Oral q1800  . metoprolol tartrate  75 mg Oral BID  . pantoprazole (PROTONIX) IV  40 mg Intravenous Q12H   Continuous Infusions: . sodium chloride 100  mL/hr at 03/04/18 1400  . heparin 900 Units/hr (03/04/18 1250)   PRN Meds:.acetaminophen **OR** acetaminophen, albuterol, morphine injection, ondansetron **OR** ondansetron (ZOFRAN) IV, oxyCODONE-acetaminophen, polyethylene glycol  Allergies as of 03/03/2018  . (No Known Allergies)    Family History  Problem Relation Age of Onset  . Stroke Father   . Heart attack Mother     Social History   Socioeconomic History  . Marital status: Married    Spouse name: Not on file  . Number of children: 1  . Years of education: 12th  . Highest education level: Not on file  Occupational History  . 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 Financial resource strain: Not on file  . Food insecurity:    Worry: Not on file    Inability: Not on file  . Transportation needs:    Medical: Not on file    Non-medical: Not on file  Tobacco Use  . Smoking status: Former Research scientist (life sciences)  . Smokeless tobacco: Never Used  Substance and Sexual Activity  . Alcohol use: No    Alcohol/week: 0.0 oz  . Drug use: No  . Sexual activity: Yes  Lifestyle  . Physical activity:    Days per week: Not on file    Minutes per session: Not on file  . Stress: Not on file  Relationships  . Social connections:    Talks on phone: Not on file    Gets together: Not on file    Attends religious service: Not on file    Active member of club or organization: Not on file    Attends meetings of clubs or organizations: Not on file    Relationship status: Not on file  . Intimate partner violence:    Fear of current or ex partner: Not on file    Emotionally abused: Not on file    Physically abused: Not on file    Forced sexual activity: Not on file  Other Topics Concern  . Not on file  Social History Narrative   Patient is married with one child.   Patient is right handed.   Patient has hs education.   Patient drinks 2 cups daily.    Review of Systems: All negative except as stated above in HPI.  Physical Exam: Vital signs: Vitals:   03/04/18 0449 03/04/18 0450  BP: (!) 157/73   Pulse: 67   Resp: 17   Temp: 98.3 F (36.8 C)   SpO2: 92% 95%   Last BM Date: 03/02/18(per patient) General:   Lethargic, elderly, well-nourished, no acute distress Head: normocephalic, atraumatic Eyes: anicteric sclera ENT: oropharynx clear Neck: supple, nontender Lungs:  Clear throughout to auscultation.   No wheezes, crackles, or rhonchi. No acute distress. Heart:  Regular rate and rhythm; no murmurs, clicks, rubs,  or gallops. Abdomen: Diffuse tenderness (worse in RLQ) with guarding, soft, nondistended, +BS Rectal:   Deferred Ext: no edema  GI:  Lab Results: Recent Labs    03/03/18 1118 03/04/18 0356  WBC 10.2 9.9  HGB 12.2 11.6*  HCT 39.5 37.4  PLT 269 269   BMET Recent Labs    03/03/18 1118 03/04/18 0356  NA 139 138  K 3.5 4.0  CL 103 105  CO2 26 25  GLUCOSE 120* 113*  BUN 21* 14  CREATININE 1.16* 1.19*  CALCIUM 9.5 9.0   LFT Recent Labs    03/03/18 1118  PROT 6.9  ALBUMIN 3.8  AST 19  ALT 11*  ALKPHOS 55  BILITOT 0.7   PT/INR Recent Labs    03/04/18 0356  LABPROT 13.7  INR 1.06     Studies/Results: Ct Abdomen Pelvis W Contrast  Result Date: 03/03/2018 CLINICAL DATA:  Abdominal pain, primarily on the right EXAM: CT ABDOMEN AND PELVIS WITH CONTRAST TECHNIQUE: Multidetector CT imaging of the abdomen and pelvis was performed using the standard protocol following bolus administration of intravenous contrast. Oral contrast was also administered. CONTRAST:  17mL OMNIPAQUE IOHEXOL 300 MG/ML  SOLN COMPARISON:  Abdominal ultrasound August 19, 2014 FINDINGS: Lower chest: There is bibasilar atelectasis. There are foci of coronary artery calcification. There is a sizable hiatal type hernia. Hepatobiliary: No focal liver lesions are evident on this noncontrast enhanced study. Gallbladder wall is not appreciably thickened. There is no biliary duct dilatation. Pancreas: There is no well-defined mass seen in the pancreas. No inflammatory focus evident. There are benign-appearing calcifications in the body of the pancreas which may represent residua of prior pancreatitis. No pancreatic duct dilatation evident. Spleen: No splenic lesions evident. Adrenals/Urinary Tract: Adrenals bilaterally appear normal. There is no renal mass on either side. There is decreased enhancement of the right kidney compared to the left kidney. There is severe hydronephrosis on the right. No hydronephrosis is evident on the left. There is no renal or ureteral calculus on either side. There is diffuse dilatation of  the left ureter to its distal aspect. In the region of the distal left ureter, there is wall thickening with what appears to be a right adnexal mass either impressing upon or frankly invading the right distal ureter. Urinary bladder is midline with wall thickness within normal limits. Stomach/Bowel: There is no appreciable bowel wall or mesenteric thickening. No evident bowel obstruction. No free air or portal venous air. Vascular/Lymphatic: There is atherosclerotic calcification in the aorta and common iliac arteries. There is also common femoral artery atherosclerosis bilaterally. No aneurysm evident. Major mesenteric arterial vessels appear patent. There is no appreciable adenopathy in the abdomen or pelvis. Reproductive: Uterus absent. There is a complex mass in the right adnexal region containing calcification. This mass measures 5.0 x 4.9 x 4.2 cm. There is enlargement of the left ovary with mixed attenuation measuring 4.8 x 4.2 x 3.9 cm. Other: There is a mass in the right adnexal region immediately adjacent to the larger mass arising from the right ovary measuring 2.5 x 2.4 x 2.4 cm. This mass may well represent an omental lesion immediately adjacent to the right ovary which is either invading or impressing upon the distal right ureter. No similar lesions elsewhere. No abscess or ascites is present in the abdomen or pelvis. Appendix not seen. It is conceivable that the mass in the right adnexal region represents an appendiceal neoplasm as opposed to an ovarian neoplasm. Musculoskeletal: There are pars defects bilaterally at L5 with 7 mm of anterolisthesis of L5 on S1. There is degenerative change in the lumbar spine. There are no blastic or lytic bone lesions. There is no intramuscular or abdominal wall lesion. IMPRESSION: 1. There are complex masses in the right adnexal region. One of these masses either extends into or compresses the distal right ureter. The larger of these right adnexal masses measures  5.0 x 4.9 x 4.2 cm. There is an adjacent mass measuring 2.5 x 2.4 x 2.4 cm. The appearance is concerning for ovarian neoplastic lesions on the right, possibly with an immediately adjacent omental lesion impressing upon or invading the right ureter.  It should be noted that a normal appendix is not seen. A neoplastic lesion involving the appendix located in the right adnexal region must be considered a differential consideration. There is no inflammation in these areas. 2. Enlarged left ovary with inhomogeneous appearance. Concern for left ovarian neoplasm also present. 3. Severe hydronephrosis and ureterectasis on the right with decreased enhancement right kidney compared to the left consistent with the obstructing focus in the distal ureter region on the right. 4. No bowel obstruction. No abscess. No renal or ureteral calculi. No hydronephrosis. 5.  No adenopathy or liver lesions evident. 6. Aortoiliac atherosclerosis. Femoral artery atherosclerosis. There is coronary artery calcification as well. 7.  Pars defects at L5 bilaterally with spondylolisthesis at L5-S1. 8.  Uterus absent. 9.  Sizable hiatal type hernia. Comment: Pelvic MR nonemergently may be helpful for further delineation of the lesions in the right adnexal region. Aortic Atherosclerosis (ICD10-I70.0). Electronically Signed   By: Lowella Grip III M.D.   On: 03/03/2018 14:25    Impression/Plan: 84 yo with right adenexal mass and enlarged left ovary with elevated CEA level and intermittent melena. No sign of active GI bleeding and heme negative. Needs an EGD/colonoscopy and will plan to prep on 03/06/18 and do the procedures with Propofol sedation on 03/07/18. Will need IV Heparin held 4 hours prior to the procedures. Clear liquid diet on 03/06/18 for colon prep. Supportive care. Will follow.    LOS: 1 day   Blackwood C.  03/04/2018, 2:58 PM  Questions please call [phone]

## 2018-03-04 NOTE — Progress Notes (Signed)
PROGRESS NOTE    [name] [name]  YHC:[id number] DOB: 11-09-1931 DOA: 03/03/2018 PCP: [name] Gravel, MD   Brief Narrative by Dr [name] [name]: [name] [name] is a 84 y.o. female with medical history significant of CAD status post remote CABG, atrial fibrillation on Eliquis, CVA without any residual deficits-presenting to the ED for evaluation of the above-noted complaints.  Per patient, approximately 2 weeks back-she started noticing pain in the right lower quadrant.  Pain initially resolved to over-the-counter medications-Tylenol/Motrin.  However pain gradually increased over the past few days.  Pain is dull, around 10/10 at its worse (per family) and without any radiation.  No particular aggravating factors, somewhat relieved by tramadol.  Patient does acknowledge some frequency of urination and dysuria.  Does not have any flank or back pain.  Patient went to her primary care doctor a few days back due to worsening right lower quadrant pain and was provided tramadol.  Unfortunately the pain continued to worsen, as a result the patient presented to the ED for further evaluation and treatment. Associated with nausea, and one episode of vomiting earlier this morning.   Assessment & Plan:   Principal Problem:   Intractable right lower quadrant abdominal pain Active Problems:   CAD (coronary artery disease)   Stroke with cerebral ischemia (HCC)   Atrial fibrillation (HCC)   Pulmonary hypertension (HCC)  1-Complex Masses Right adnexa and left ovary enlargement concern for ovarian neoplasm.  Intractable right lower Quadrant abdominal pain. Related to adnexal masses;  Pain controlled.  -CEA 10, elevated.  -CA 125 29 normal.  -US pelvis ordered.  -Appreciate Dr [name] [name] evaluation she is recommending GI consultation given bilateral adnexal masses (concern with metastatic diseases), Elevated CEA and black stool for GI work up prior any surgical resection. .  -Dr [name] [name] consulted.  ? Melena, added PPI.  GI consulted. Monitor hb.   Right Hydronephrosis, with adnexal Masses;  Cr stable.  Dr [name] [name] consulted. ? Stent at time of Sx.   Chronic A fib; prior history of stroke.  On eliquis at home  Continue with heparin Gtt.  Monitor hb daily.   History of CVA; on eliquis at home  Continue with heparin.    DVT prophylaxis: Heparin Gtt Code Status: full code.  Family Communication: son in law at bedside.  Disposition Plan: to be determine , work up for adnexal mass in progress.    Consultants:   GI  GYN  Urology      Procedures:   US pelvis/    Antimicrobials:   none   Subjective: She report nausea. Report on and off black stool.  Also report abdominal pain  She has history of larynx spasm, for which she gets Botox.   Objective: Vitals:   03/03/18 1859 03/03/18 2138 03/04/18 0449 03/04/18 0450  BP: (!) 171/81 (!) 154/88 (!) 157/73   Pulse: 66 (!) 122 67   Resp: 17 17 17    Temp: 98.5 F (36.9 C) 99 F (37.2 C) 98.3 F (36.8 C)   TempSrc: Oral Oral Oral   SpO2: (!) 85% 95% 92% 95%  Weight:      Height:        Intake/Output Summary (Last 24 hours) at 03/04/2018 0850 Last data filed at 03/04/2018 0815 Gross per 24 hour  Intake 1471.07 ml  Output 900 ml  Net 571.07 ml   Filed Weights   03/03/18 1106  Weight: 57.2 kg (126 lb)    Examination:  General exam: Appears calm and comfortable  Respiratory system:  Clear to auscultation. Respiratory effort normal. Cardiovascular system: S1 & S2 heard, RRR. No JVD, murmurs, rubs, gallops or clicks. No pedal edema. Gastrointestinal system: Abdomen is  soft and mild tender lower quadrant. No organomegaly or masses felt. Normal bowel sounds heard. Central nervous system: Alert and oriented. No focal neurological deficits. Extremities: Symmetric 5 x 5 power. Skin: No rashes, lesions or ulcers Psychiatry: Judgement and insight appear normal. Mood & affect appropriate.     Data Reviewed: I have personally  reviewed following labs and imaging studies  CBC: Recent Labs  Lab 03/03/18 1118 03/04/18 0356  WBC 10.2 9.9  HGB 12.2 11.6*  HCT 39.5 37.4  MCV 87.2 88.0  PLT 269 161   Basic Metabolic Panel: Recent Labs  Lab 03/03/18 1118 03/04/18 0356  NA 139 138  K 3.5 4.0  CL 103 105  CO2 26 25  GLUCOSE 120* 113*  BUN 21* 14  CREATININE 1.16* 1.19*  CALCIUM 9.5 9.0   GFR: Estimated Creatinine Clearance: 28.4 mL/min (A) (by C-G formula based on SCr of 1.19 mg/dL (H)). Liver Function Tests: Recent Labs  Lab 03/03/18 1118  AST 19  ALT 11*  ALKPHOS 55  BILITOT 0.7  PROT 6.9  ALBUMIN 3.8   Recent Labs  Lab 03/03/18 1118  LIPASE 31   No results for input(s): AMMONIA in the last 168 hours. Coagulation Profile: Recent Labs  Lab 03/04/18 0356  INR 1.06   Cardiac Enzymes: No results for input(s): CKTOTAL, CKMB, CKMBINDEX, TROPONINI in the last 168 hours. BNP (last 3 results) No results for input(s): PROBNP in the last 8760 hours. HbA1C: No results for input(s): HGBA1C in the last 72 hours. CBG: No results for input(s): GLUCAP in the last 168 hours. Lipid Profile: No results for input(s): CHOL, HDL, LDLCALC, TRIG, CHOLHDL, LDLDIRECT in the last 72 hours. Thyroid Function Tests: No results for input(s): TSH, T4TOTAL, FREET4, T3FREE, THYROIDAB in the last 72 hours. Anemia Panel: No results for input(s): VITAMINB12, FOLATE, FERRITIN, TIBC, IRON, RETICCTPCT in the last 72 hours. Sepsis Labs: No results for input(s): PROCALCITON, LATICACIDVEN in the last 168 hours.  No results found for this or any previous visit (from the past 240 hour(s)).       Radiology Studies: Ct Abdomen Pelvis W Contrast  Result Date: 03/03/2018 CLINICAL DATA:  Abdominal pain, primarily on the right EXAM: CT ABDOMEN AND PELVIS WITH CONTRAST TECHNIQUE: Multidetector CT imaging of the abdomen and pelvis was performed using the standard protocol following bolus administration of intravenous  contrast. Oral contrast was also administered. CONTRAST:  68mL OMNIPAQUE IOHEXOL 300 MG/ML  SOLN COMPARISON:  Abdominal ultrasound August 19, 2014 FINDINGS: Lower chest: There is bibasilar atelectasis. There are foci of coronary artery calcification. There is a sizable hiatal type hernia. Hepatobiliary: No focal liver lesions are evident on this noncontrast enhanced study. Gallbladder wall is not appreciably thickened. There is no biliary duct dilatation. Pancreas: There is no well-defined mass seen in the pancreas. No inflammatory focus evident. There are benign-appearing calcifications in the body of the pancreas which may represent residua of prior pancreatitis. No pancreatic duct dilatation evident. Spleen: No splenic lesions evident. Adrenals/Urinary Tract: Adrenals bilaterally appear normal. There is no renal mass on either side. There is decreased enhancement of the right kidney compared to the left kidney. There is severe hydronephrosis on the right. No hydronephrosis is evident on the left. There is no renal or ureteral calculus on either side. There is diffuse dilatation of the left ureter to  its distal aspect. In the region of the distal left ureter, there is wall thickening with what appears to be a right adnexal mass either impressing upon or frankly invading the right distal ureter. Urinary bladder is midline with wall thickness within normal limits. Stomach/Bowel: There is no appreciable bowel wall or mesenteric thickening. No evident bowel obstruction. No free air or portal venous air. Vascular/Lymphatic: There is atherosclerotic calcification in the aorta and common iliac arteries. There is also common femoral artery atherosclerosis bilaterally. No aneurysm evident. Major mesenteric arterial vessels appear patent. There is no appreciable adenopathy in the abdomen or pelvis. Reproductive: Uterus absent. There is a complex mass in the right adnexal region containing calcification. This mass measures  5.0 x 4.9 x 4.2 cm. There is enlargement of the left ovary with mixed attenuation measuring 4.8 x 4.2 x 3.9 cm. Other: There is a mass in the right adnexal region immediately adjacent to the larger mass arising from the right ovary measuring 2.5 x 2.4 x 2.4 cm. This mass may well represent an omental lesion immediately adjacent to the right ovary which is either invading or impressing upon the distal right ureter. No similar lesions elsewhere. No abscess or ascites is present in the abdomen or pelvis. Appendix not seen. It is conceivable that the mass in the right adnexal region represents an appendiceal neoplasm as opposed to an ovarian neoplasm. Musculoskeletal: There are pars defects bilaterally at L5 with 7 mm of anterolisthesis of L5 on S1. There is degenerative change in the lumbar spine. There are no blastic or lytic bone lesions. There is no intramuscular or abdominal wall lesion. IMPRESSION: 1. There are complex masses in the right adnexal region. One of these masses either extends into or compresses the distal right ureter. The larger of these right adnexal masses measures 5.0 x 4.9 x 4.2 cm. There is an adjacent mass measuring 2.5 x 2.4 x 2.4 cm. The appearance is concerning for ovarian neoplastic lesions on the right, possibly with an immediately adjacent omental lesion impressing upon or invading the right ureter. It should be noted that a normal appendix is not seen. A neoplastic lesion involving the appendix located in the right adnexal region must be considered a differential consideration. There is no inflammation in these areas. 2. Enlarged left ovary with inhomogeneous appearance. Concern for left ovarian neoplasm also present. 3. Severe hydronephrosis and ureterectasis on the right with decreased enhancement right kidney compared to the left consistent with the obstructing focus in the distal ureter region on the right. 4. No bowel obstruction. No abscess. No renal or ureteral calculi. No  hydronephrosis. 5.  No adenopathy or liver lesions evident. 6. Aortoiliac atherosclerosis. Femoral artery atherosclerosis. There is coronary artery calcification as well. 7.  Pars defects at L5 bilaterally with spondylolisthesis at L5-S1. 8.  Uterus absent. 9.  Sizable hiatal type hernia. Comment: Pelvic MR nonemergently may be helpful for further delineation of the lesions in the right adnexal region. Aortic Atherosclerosis (ICD10-I70.0). Electronically Signed   By: Lowella Grip III M.D.   On: 03/03/2018 14:25        Scheduled Meds: . atorvastatin  10 mg Oral q1800  . metoprolol tartrate  75 mg Oral BID   Continuous Infusions: . sodium chloride    . heparin 900 Units/hr (03/04/18 0359)     LOS: 1 day    Time spent: 35 minutes.     Elmarie Shiley, MD [facility] Pager [phone] If 7PM-7AM, please contact night-coverage [email or web address] Password  [email or web address] 03/04/2018, 8:50 AM

## 2018-03-04 NOTE — Consult Note (Signed)
Urology Consult  Referring physician: Nena Alexander Reason for referral: hydronephrosis  Chief Complaint: Hyddronephrosis  History of Present Illness: Elderly patient admitted for RLQ pain and hydro and adnexal mass. Medical comorbidities on blood thinners; worsened over two weeks; pain control started and eliquis held;   Some of the details of history varied: in last two weeks was having bilateral discomfort lower abdomen; right equals left; then it was severe but now it is low grade and "more just feel nauseated"; then she reports approx. Two day history of right flank pain in back but none since; no cystitis symptoms  Baseline: recurrent UTI/frequency and nocturia; no previous GU surgery and no stones   Spoke to nursing staff: no pain issues thru night  Modifying factors: There are no other modifying factors  Associated signs and symptoms: There are no other associated signs and symptoms Aggravating and relieving factors: There are no other aggravating or relieving factors Severity: Moderate Duration: Persistent  Gyn/Onc: concerned for metastatic ovarian masses: wanted GI work up for black stools; transvaginal u/sound ordered  Serum Cr 1.19  CT 1. There are complex masses in the right adnexal region. One of these masses either extends into or compresses the distal right ureter. The larger of these right adnexal masses measures 5.0 x 4.9 x 4.2 cm. There is an adjacent mass measuring 2.5 x 2.4 x 2.4 cm. The appearance is concerning for ovarian neoplastic lesions on the right, possibly with an immediately adjacent omental lesion impressing upon or invading the right ureter. It should be noted that a normal appendix is not seen. A neoplastic lesion involving the appendix located in the right adnexal region must be considered a differential consideration. There is no inflammation in these Areas. Severe hydronephrosis and ureterectasis on the right with decreased enhancement right  kidney compared to the left consistent with the obstructing focus in the distal ureter region on the right.  Past Medical History:  Diagnosis Date  . CAD (coronary artery disease)    multivessel  . Hiatal hernia   . Hypercholesterolemia   . Hypertension   . Iron deficiency   . Osteopenia   . Vitamin B12 deficiency    Past Surgical History:  Procedure Laterality Date  . CORONARY ARTERY BYPASS GRAFT     x 5  . left breast biopsy    . partial hystercetomy    . spasmodic dysphonia      Medications: I have reviewed the patient's current medications. Allergies: No Known Allergies  Family History  Problem Relation Age of Onset  . Stroke Father   . Heart attack Mother    Social History:  reports that she has quit smoking. She has never used smokeless tobacco. She reports that she does not drink alcohol or use drugs.  ROS: All systems are reviewed and negative except as noted. Rest negative  Physical Exam:  Vital signs in last 24 hours: Temp:  [97.9 F (36.6 C)-99 F (37.2 C)] 98.3 F (36.8 C) (05/25 0449) Pulse Rate:  [66-122] 67 (05/25 0449) Resp:  [10-21] 17 (05/25 0449) BP: (104-171)/(71-93) 157/73 (05/25 0449) SpO2:  [85 %-99 %] 95 % (05/25 0450) Weight:  [57.2 kg (126 lb)] 57.2 kg (126 lb) (05/24 1106)  Cardiovascular: Skin warm; not flushed Respiratory: Breaths quiet; no shortness of breath Abdomen: No masses Neurological: Normal sensation to touch Musculoskeletal: Normal motor function arms and legs Lymphatics: No inguinal adenopathy Skin: No rashes Genitourinary:no CVA tenderness; no belly tenderness; looks comfortable  Laboratory Data:  Results  for orders placed or performed during the hospital encounter of 03/03/18 (from the past 72 hour(s))  Lipase, blood     Status: None   Collection Time: 03/03/18 11:18 AM  Result Value Ref Range   Lipase 31 11 - 51 U/L    Comment: Performed at [facility], [address], [place], [place] [address]   Comprehensive metabolic panel     Status: Abnormal   Collection Time: 03/03/18 11:18 AM  Result Value Ref Range   Sodium 139 135 - 145 mmol/L   Potassium 3.5 3.5 - 5.1 mmol/L   Chloride 103 101 - 111 mmol/L   CO2 26 22 - 32 mmol/L   Glucose, Bld 120 (H) 65 - 99 mg/dL   BUN 21 (H) 6 - 20 mg/dL   Creatinine, Ser 1.16 (H) 0.44 - 1.00 mg/dL   Calcium 9.5 8.9 - 10.3 mg/dL   Total Protein 6.9 6.5 - 8.1 g/dL   Albumin 3.8 3.5 - 5.0 g/dL   AST 19 15 - 41 U/L   ALT 11 (L) 14 - 54 U/L   Alkaline Phosphatase 55 38 - 126 U/L   Total Bilirubin 0.7 0.3 - 1.2 mg/dL   GFR calc non Af Amer 42 (L) >60 mL/min   GFR calc Af Amer 49 (L) >60 mL/min    Comment: (NOTE) The eGFR has been calculated using the CKD EPI equation. This calculation has not been validated in all clinical situations. eGFR's persistently <60 mL/min signify possible Chronic Kidney Disease.    Anion gap 10 5 - 15    Comment: Performed at [facility] [address], [place] [address]  CBC     Status: None   Collection Time: 03/03/18 11:18 AM  Result Value Ref Range   WBC 10.2 4.0 - 10.5 K/uL   RBC 4.53 3.87 - 5.11 MIL/uL   Hemoglobin 12.2 12.0 - 15.0 g/dL   HCT 39.5 36.0 - 46.0 %   MCV 87.2 78.0 - 100.0 fL   MCH 26.9 26.0 - 34.0 pg   MCHC 30.9 30.0 - 36.0 g/dL   RDW 13.3 11.5 - 15.5 %   Platelets 269 150 - 400 K/uL    Comment: Performed at [facility], [place] [address], [place], [place] [address]  POC occult blood, ED Provider will collect     Status: None   Collection Time: 03/03/18 11:38 AM  Result Value Ref Range   Fecal Occult Bld NEGATIVE NEGATIVE  I-Stat Troponin, ED (not at [facility])     Status: None   Collection Time: 03/03/18 11:42 AM  Result Value Ref Range   Troponin i, poc 0.00 0.00 - 0.08 ng/mL   Comment 3            Comment: Due to the release kinetics of cTnI, a negative result within the first hours of the onset of symptoms does not rule out myocardial infarction with certainty. If  myocardial infarction is still suspected, repeat the test at appropriate intervals.   Urinalysis, Routine w reflex microscopic     Status: Abnormal   Collection Time: 03/03/18 12:50 PM  Result Value Ref Range   Color, Urine STRAW (A) YELLOW   APPearance HAZY (A) CLEAR   Specific Gravity, Urine 1.014 1.005 - 1.030   pH 7.0 5.0 - 8.0   Glucose, UA NEGATIVE NEGATIVE mg/dL   Hgb urine dipstick NEGATIVE NEGATIVE   Bilirubin Urine NEGATIVE NEGATIVE   Ketones, ur 5 (A)  NEGATIVE mg/dL   Protein, ur NEGATIVE NEGATIVE mg/dL   Nitrite NEGATIVE NEGATIVE   Leukocytes, UA SMALL (A) NEGATIVE   RBC / HPF 0-5 0 - 5 RBC/hpf   WBC, UA 0-5 0 - 5 WBC/hpf   Bacteria, UA RARE (A) NONE SEEN   Squamous Epithelial / LPF 6-10 0 - 5    Comment: Performed at [facility], [place] [address] [place], [place] [address]  CA 125     Status: None   Collection Time: 03/03/18  7:06 PM  Result Value Ref Range   Cancer Antigen (CA) 125 29.0 0.0 - 38.1 U/mL    Comment: (NOTE) Roche [employer] Electrochemiluminescence Immunoassay (ECLIA) Values obtained with different assay methods or kits cannot be used interchangeably.  Results cannot be interpreted as absolute evidence of the presence or absence of malignant disease. Performed At: [facility] [place], [place] [id number] Rush Farmer MD QP:[id number] Performed at [facility], [place] [address], [place], [place] [address]   CEA     Status: Abnormal   Collection Time: 03/03/18  7:06 PM  Result Value Ref Range   CEA 10.3 (H) 0.0 - 4.7 ng/mL    Comment: (NOTE)                             Nonsmokers          <3.9                             Smokers             <5.6 [employer] Electrochemiluminescence Immunoassay (ECLIA) Values obtained with different assay methods or kits cannot be used interchangeably.  Results cannot be interpreted as absolute evidence of the presence or absence of malignant disease. Performed At: [facility] [place], [place] [id number] Rush Farmer MD KN:[id number] Performed at [facility], [place] [address] [place], [place] [address]   Heparin level (unfractionated)     Status: Abnormal   Collection Time: 03/03/18 11:29 PM  Result Value Ref Range   Heparin Unfractionated 1.20 (H) 0.30 - 0.70 IU/mL    Comment: RESULTS CONFIRMED BY MANUAL DILUTION (NOTE) If heparin results are below expected values, and patient dosage has  been confirmed, suggest follow up testing of antithrombin III levels. Performed at [facility], [place] [address], [place], [place] [address]   APTT     Status: Abnormal   Collection Time: 03/03/18 11:45 PM  Result Value Ref Range   aPTT 52 (H) 24 - 36 seconds    Comment:        IF BASELINE aPTT IS ELEVATED, SUGGEST PATIENT RISK ASSESSMENT BE USED TO DETERMINE APPROPRIATE ANTICOAGULANT THERAPY. Performed at [facility], [place] [address] [place], [place] [address]   CBC     Status: Abnormal   Collection Time: 03/04/18  3:56 AM  Result Value Ref Range   WBC 9.9 4.0 - 10.5 K/uL   RBC 4.25 3.87 - 5.11 MIL/uL   Hemoglobin 11.6 (L) 12.0 - 15.0 g/dL   HCT 37.4 36.0 - 46.0 %   MCV 88.0 78.0 - 100.0 fL   MCH 27.3 26.0 - 34.0 pg   MCHC 31.0 30.0 - 36.0 g/dL   RDW 13.3 11.5 - 15.5 %   Platelets 269 150 - 400 K/uL    Comment:  Performed at [facility], [place] [address], [place], [place] [address]  Basic metabolic panel     Status: Abnormal   Collection Time: 03/04/18  3:56 AM  Result Value Ref Range   Sodium 138 135 - 145 mmol/L   Potassium 4.0 3.5 - 5.1 mmol/L   Chloride 105 101 - 111 mmol/L   CO2 25 22 - 32 mmol/L   Glucose, Bld 113 (H) 65 - 99 mg/dL   BUN 14 6 - 20 mg/dL   Creatinine, Ser 1.19 (H) 0.44 - 1.00 mg/dL   Calcium 9.0 8.9 - 10.3 mg/dL   GFR calc non Af Amer 41 (L) >60 mL/min   GFR calc Af Amer 48 (L) >60 mL/min    Comment: (NOTE) The eGFR has been calculated using the CKD EPI equation. This  calculation has not been validated in all clinical situations. eGFR's persistently <60 mL/min signify possible Chronic Kidney Disease.    Anion gap 8 5 - 15    Comment: Performed at [facility] [address] [place], [place] [address]  Protime-INR     Status: None   Collection Time: 03/04/18  3:56 AM  Result Value Ref Range   Prothrombin Time 13.7 11.4 - 15.2 seconds   INR 1.06     Comment: Performed at [facility], [place] [address] [place], [place] [address]   No results found for this or any previous visit (from the past 240 hour(s)). Creatinine: Recent Labs    03/03/18 1118 03/04/18 0356  CREATININE 1.16* 1.19*    Xrays: See report/chart Reviewed  Impression/Assessment:  Right hydronephrosis with ovarian masses; picture drawn  Plan:  No acute reason to stent today (lack of pain, fever , elevated Cr) Will follow ? Stent at time of ? Surgery if needed by gynecology Call me at [phone] if questions  Pros and cons and risks and indications of stent discussed     Joniece Smotherman A 03/04/2018, 8:47 AM

## 2018-03-04 NOTE — Consult Note (Signed)
Referring Provider: Dr. Tyrell Antonio Primary Care Physician:  Jani Gravel, MD Primary Gastroenterologist:  Althia Forts  Reason for Consultation:  Abdominal pain; GI bleed  HPI: [name] [name] is a 84 y.o. female with multiple medical problems seen for a consult due to abdominal pain and melena. Reports black stools intermittently for a month and does not know when her last episode of black stools was but last BM prior to admit was Thursday and was brown. Has been having RLQ, right-side and right flank pain for the past 2 weeks. Pain is dull and severe at times. CT shows a right adnexal mass and enlarged left ovary concerning for a neoplasm. Reports last colonoscopy over 10 years ago and records not known. Daughter in room. Prior to admit was on Elquis for Afib and also has a history of a stroke. Currently on Heparin IV drip. Hgb 11.6. CEA elevated at 10.3. CA-125 29. Hemoccult negative.  Past Medical History:  Diagnosis Date  . CAD (coronary artery disease)    multivessel  . H/O atrial fibrillation   . H/O: CVA (cerebrovascular accident)    presented 29 with slurred speech. No residual deficit  . Hiatal hernia   . Hypercholesterolemia   . Hypertension   . Iron deficiency   . Osteopenia   . Spasmodic dysphonia   . Vitamin B12 deficiency     Past Surgical History:  Procedure Laterality Date  . CORONARY ARTERY BYPASS GRAFT     x 5  . left breast biopsy    . spasmodic dysphonia    . VAGINAL HYSTERECTOMY     for prolapse    Prior to Admission medications   Medication Sig Start Date End Date Taking? Authorizing Provider  acetaminophen (TYLENOL) 500 MG tablet Take 500 mg by mouth every 6 (six) hours as needed for moderate pain (pain).    Yes [provider]  amLODipine (NORVASC) 2.5 MG tablet Take 2.5 mg by mouth daily.  07/29/14  Yes [provider]  atorvastatin (LIPITOR) 10 MG tablet TAKE 1 TABLET (10 MG TOTAL) BY MOUTH DAILY AT 6 PM. 04/14/16  Yes Rosalin Hawking, MD   carboxymethylcellulose (REFRESH PLUS) 0.5 % SOLN Place 1 drop into both eyes as needed (dry eyes).   Yes [provider]  ELIQUIS 2.5 MG TABS tablet TAKE 1 TABLET BY MOUTH TWICE A DAY 05/07/16  Yes Rosalin Hawking, MD  metoprolol (LOPRESSOR) 50 MG tablet Take 50 mg by mouth 2 (two) times daily. 07/16/14  Yes [provider]  nitroGLYCERIN (NITROSTAT) 0.4 MG SL tablet Place 0.4 mg under the tongue every 5 (five) minutes as needed for chest pain.   Yes [provider]  OnabotulinumtoxinA (BOTOX IJ) Inject as directed every 3 (three) months.   Yes [provider]  traMADol (ULTRAM) 50 MG tablet Take 50 mg by mouth every 6 (six) hours as needed for pain. 03/02/18  Yes [provider]  levothyroxine (SYNTHROID, LEVOTHROID) 25 MCG tablet Take 25 mcg by mouth daily. Per Physician take once weekly 03/02/18   [provider]  predniSONE (DELTASONE) 1 MG tablet Take 2 mg by mouth daily with breakfast.    [provider]  sertraline (ZOLOFT) 50 MG tablet Take 50 mg by mouth daily. 02/23/15   [provider]    Scheduled Meds: . atorvastatin  10 mg Oral q1800  . metoprolol tartrate  75 mg Oral BID  . pantoprazole (PROTONIX) IV  40 mg Intravenous Q12H   Continuous Infusions: . sodium chloride 100  mL/hr at 03/04/18 1400  . heparin 900 Units/hr (03/04/18 1250)   PRN Meds:.acetaminophen **OR** acetaminophen, albuterol, morphine injection, ondansetron **OR** ondansetron (ZOFRAN) IV, oxyCODONE-acetaminophen, polyethylene glycol  Allergies as of 03/03/2018  . (No Known Allergies)    Family History  Problem Relation Age of Onset  . Stroke Father   . Heart attack Mother     Social History   Socioeconomic History  . Marital status: Married    Spouse name: Not on file  . Number of children: 1  . Years of education: 12th  . Highest education level: Not on file  Occupational History  . 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 Financial resource strain: Not on file  . Food insecurity:    Worry: Not on file    Inability: Not on file  . Transportation needs:    Medical: Not on file    Non-medical: Not on file  Tobacco Use  . Smoking status: Former Research scientist (life sciences)  . Smokeless tobacco: Never Used  Substance and Sexual Activity  . Alcohol use: No    Alcohol/week: 0.0 oz  . Drug use: No  . Sexual activity: Yes  Lifestyle  . Physical activity:    Days per week: Not on file    Minutes per session: Not on file  . Stress: Not on file  Relationships  . Social connections:    Talks on phone: Not on file    Gets together: Not on file    Attends religious service: Not on file    Active member of club or organization: Not on file    Attends meetings of clubs or organizations: Not on file    Relationship status: Not on file  . Intimate partner violence:    Fear of current or ex partner: Not on file    Emotionally abused: Not on file    Physically abused: Not on file    Forced sexual activity: Not on file  Other Topics Concern  . Not on file  Social History Narrative   Patient is married with one child.   Patient is right handed.   Patient has hs education.   Patient drinks 2 cups daily.    Review of Systems: All negative except as stated above in HPI.  Physical Exam: Vital signs: Vitals:   03/04/18 0449 03/04/18 0450  BP: (!) 157/73   Pulse: 67   Resp: 17   Temp: 98.3 F (36.8 C)   SpO2: 92% 95%   Last BM Date: 03/02/18(per patient) General:   Lethargic, elderly, well-nourished, no acute distress Head: normocephalic, atraumatic Eyes: anicteric sclera ENT: oropharynx clear Neck: supple, nontender Lungs:  Clear throughout to auscultation.   No wheezes, crackles, or rhonchi. No acute distress. Heart:  Regular rate and rhythm; no murmurs, clicks, rubs,  or gallops. Abdomen: Diffuse tenderness (worse in RLQ) with guarding, soft, nondistended, +BS Rectal:   Deferred Ext: no edema  GI:  Lab Results: Recent Labs    03/03/18 1118 03/04/18 0356  WBC 10.2 9.9  HGB 12.2 11.6*  HCT 39.5 37.4  PLT 269 269   BMET Recent Labs    03/03/18 1118 03/04/18 0356  NA 139 138  K 3.5 4.0  CL 103 105  CO2 26 25  GLUCOSE 120* 113*  BUN 21* 14  CREATININE 1.16* 1.19*  CALCIUM 9.5 9.0   LFT Recent Labs    03/03/18 1118  PROT 6.9  ALBUMIN 3.8  AST 19  ALT 11*  ALKPHOS 55  BILITOT 0.7   PT/INR Recent Labs    03/04/18 0356  LABPROT 13.7  INR 1.06     Studies/Results: Ct Abdomen Pelvis W Contrast  Result Date: 03/03/2018 CLINICAL DATA:  Abdominal pain, primarily on the right EXAM: CT ABDOMEN AND PELVIS WITH CONTRAST TECHNIQUE: Multidetector CT imaging of the abdomen and pelvis was performed using the standard protocol following bolus administration of intravenous contrast. Oral contrast was also administered. CONTRAST:  47mL OMNIPAQUE IOHEXOL 300 MG/ML  SOLN COMPARISON:  Abdominal ultrasound August 19, 2014 FINDINGS: Lower chest: There is bibasilar atelectasis. There are foci of coronary artery calcification. There is a sizable hiatal type hernia. Hepatobiliary: No focal liver lesions are evident on this noncontrast enhanced study. Gallbladder wall is not appreciably thickened. There is no biliary duct dilatation. Pancreas: There is no well-defined mass seen in the pancreas. No inflammatory focus evident. There are benign-appearing calcifications in the body of the pancreas which may represent residua of prior pancreatitis. No pancreatic duct dilatation evident. Spleen: No splenic lesions evident. Adrenals/Urinary Tract: Adrenals bilaterally appear normal. There is no renal mass on either side. There is decreased enhancement of the right kidney compared to the left kidney. There is severe hydronephrosis on the right. No hydronephrosis is evident on the left. There is no renal or ureteral calculus on either side. There is diffuse dilatation of  the left ureter to its distal aspect. In the region of the distal left ureter, there is wall thickening with what appears to be a right adnexal mass either impressing upon or frankly invading the right distal ureter. Urinary bladder is midline with wall thickness within normal limits. Stomach/Bowel: There is no appreciable bowel wall or mesenteric thickening. No evident bowel obstruction. No free air or portal venous air. Vascular/Lymphatic: There is atherosclerotic calcification in the aorta and common iliac arteries. There is also common femoral artery atherosclerosis bilaterally. No aneurysm evident. Major mesenteric arterial vessels appear patent. There is no appreciable adenopathy in the abdomen or pelvis. Reproductive: Uterus absent. There is a complex mass in the right adnexal region containing calcification. This mass measures 5.0 x 4.9 x 4.2 cm. There is enlargement of the left ovary with mixed attenuation measuring 4.8 x 4.2 x 3.9 cm. Other: There is a mass in the right adnexal region immediately adjacent to the larger mass arising from the right ovary measuring 2.5 x 2.4 x 2.4 cm. This mass may well represent an omental lesion immediately adjacent to the right ovary which is either invading or impressing upon the distal right ureter. No similar lesions elsewhere. No abscess or ascites is present in the abdomen or pelvis. Appendix not seen. It is conceivable that the mass in the right adnexal region represents an appendiceal neoplasm as opposed to an ovarian neoplasm. Musculoskeletal: There are pars defects bilaterally at L5 with 7 mm of anterolisthesis of L5 on S1. There is degenerative change in the lumbar spine. There are no blastic or lytic bone lesions. There is no intramuscular or abdominal wall lesion. IMPRESSION: 1. There are complex masses in the right adnexal region. One of these masses either extends into or compresses the distal right ureter. The larger of these right adnexal masses measures  5.0 x 4.9 x 4.2 cm. There is an adjacent mass measuring 2.5 x 2.4 x 2.4 cm. The appearance is concerning for ovarian neoplastic lesions on the right, possibly with an immediately adjacent omental lesion impressing upon or invading the right ureter.  It should be noted that a normal appendix is not seen. A neoplastic lesion involving the appendix located in the right adnexal region must be considered a differential consideration. There is no inflammation in these areas. 2. Enlarged left ovary with inhomogeneous appearance. Concern for left ovarian neoplasm also present. 3. Severe hydronephrosis and ureterectasis on the right with decreased enhancement right kidney compared to the left consistent with the obstructing focus in the distal ureter region on the right. 4. No bowel obstruction. No abscess. No renal or ureteral calculi. No hydronephrosis. 5.  No adenopathy or liver lesions evident. 6. Aortoiliac atherosclerosis. Femoral artery atherosclerosis. There is coronary artery calcification as well. 7.  Pars defects at L5 bilaterally with spondylolisthesis at L5-S1. 8.  Uterus absent. 9.  Sizable hiatal type hernia. Comment: Pelvic MR nonemergently may be helpful for further delineation of the lesions in the right adnexal region. Aortic Atherosclerosis (ICD10-I70.0). Electronically Signed   By: Lowella Grip III M.D.   On: 03/03/2018 14:25    Impression/Plan: 84 yo with right adenexal mass and enlarged left ovary with elevated CEA level and intermittent melena. No sign of active GI bleeding and heme negative. Needs an EGD/colonoscopy and will plan to prep on 03/06/18 and do the procedures with Propofol sedation on 03/07/18. Will need IV Heparin held 4 hours prior to the procedures. Clear liquid diet on 03/06/18 for colon prep. Supportive care. Will follow.    LOS: 1 day   Castalia C.  03/04/2018, 2:58 PM  Questions please call [phone]

## 2018-03-04 NOTE — Progress Notes (Signed)
Gyn Onc Consult:  Brief note to summarize visit/recommendations; full consult note to follow today.  S: C/o right flank pain x 2-3 days. Nausea x 2-3 weeks. Admits to black stools for "some time". Occasional BRBPR due to hemorrhoids acting up. No pelvic pain, just "feeling sick".  O: CT reviewed as well as tumor markers. Bilateral adnexal masses with hydroureter/nephrosis on right.  CA125 normal CEA 10+ (elevated)  A/P: 1. Given bilateral adnexal masses (which increases concern for metastatic disease), elevated CEA, nausea, and black stools I recommend GI workup prior to proceeding to surgical resection of adnexa. 2. If proceed to surgical resection of adnexa plan would be laparascopic. However I worry in the face of ureteral obstruction, which is not due to the volume of the mass, chances are high for conversion to laparotomy which increases the morbidity and risk of complications in the postoperative setting. 3. Defer plan for ureteral stent to urology. 4. Transvaginal ultrasound ordered to get a sense for the vascularity/internal composition of the adnexa.  Precious Haws, MD Gyn [phone] (pager)

## 2018-03-04 NOTE — Consult Note (Addendum)
Consult Note: Gyn-Onc  Consult was requested by Dr. Sloan Leiter with the hospitalist service.   CC:  Flank pain and nausea Found to have bilateral adnexal masses and hydronephrosis   HPI: [name]  is a very nice 84 y.o.  P1  PMH complicated by h/o CVA and CAD. No residual deficits from CVA, but is on oral anticoagulation.   She lives fairly independently, caring jointly with her daughter for her husband who has had some difficulty since his MI. She does travel out of the house for meals on occasion, but is mostly confined to her home where she has stairs she goes up and down several times a day without respiratory distress.   She reports starting about 2-3 weeks ago she noted nausea. 2 days ago this was accompanied by right flank pain and she presented to her PCP. She was given pain medication however on the day of presenting to the ER she reports she vomited. A CT scan was ordered with oral contrast and she vomited the contrast. Outside of these 2 episodes she has had no other emesis. She denies fever.  She denies pelvic pain, states just feels bloating in that area and nausea.  On further questioning she admits to [facility] with "hemorrhoids" that act up and lack of bowel control at times, which sounds chronic. States occasional black stools. Denies iron or MVI use.  Last colonoscopy >10 years ago given her age, and notes that one was normal.  Has a good appetite, but has noted early satiety about 1 year. Denies weight loss.  Measurement of disease:  Recent Labs    03/03/18 1906  CAN125 29.0    **CEA = 10.3  Radiology: . CTScan reviewed by me. Bilateral masses in adnexa. No obvious extrapelvic disease. Radiology eludes to omental disease although on the images  I reviewed the only area may be the LUQ where I think it's small bowel when I view in coronal with localizer on axial views. Other is the RLQ but I am not able to make out obvious omental disease. Marland Kitchen TVUS / Pelvic  Abdom US pending   Allergy: No Known Allergies  Social Hx:  Tobacco use: prior tobacco use; quit with child 50 years ago Alcohol use: none Illicit Drug use: none  Past Surgical Hx:  Past Surgical History:  Procedure Laterality Date  . CORONARY ARTERY BYPASS GRAFT     x 5  . left breast biopsy    . spasmodic dysphonia    . VAGINAL HYSTERECTOMY     for prolapse    Past Medical Hx:  Past Medical History:  Diagnosis Date  . CAD (coronary artery disease)    multivessel  . H/O atrial fibrillation   . H/O: CVA (cerebrovascular accident)    presented 21 with slurred speech. No residual deficit  . Hiatal hernia   . Hypercholesterolemia   . Hypertension   . Iron deficiency   . Osteopenia   . Spasmodic dysphonia   . Vitamin B12 deficiency     Past Gynecological History:   GYNECOLOGIC HISTORY:  No LMP recorded. Patient has had a hysterectomy. Menarche: 84 years old P 1 LMP 52 Contraceptive no OCP but used "foam" HRT none  Last Pap N/A had Hysterectomy for prolapse  Family Hx:  Family History  Problem Relation Age of Onset  . Stroke Father   . Heart attack Mother     Review of Systems:  As in HPI  Vitals:  Blood pressure (!) 157/73,  pulse 67, temperature 98.3 F (36.8 C), temperature source Oral, resp. rate 17, height 5' (1.524 m), weight 126 lb (57.2 kg), SpO2 95 %. Body mass index is 24.61 kg/m.   Physical Exam: ECOG PERFORMANCE STATUS: 1 - Symptomatic but completely ambulatory   General :  Well developed, 84 y.o., female in no apparent distress HEENT:  Normocephalic/atraumatic, symmetric, EOMI, eyelids normal Neck:   Supple, no masses.  Respiratory:  CTA BL CV:   RRR, no murmurs Breast:  Deferred Abdomen:   Soft, non-tender and nondistended. No evidence of hernia. No masses. Extremities:  No lymphedema, no erythema, non-tender. Skin:   Normal inspection Neuro/Psych:  No focal motor deficit, no abnormal mental status. Normal affect. Alert and  oriented to person, place, and time  Genito Urinary: Deferred in hospital bed on meeting  Assessment Bilateral adnexal masses Elevated CEA Hydronephrosis  Plan 1. Given bilateral adnexal masses (which increases concern for metastatic disease), elevated CEA, nausea, and black stools I recommend GI workup prior to proceeding to surgical resection of adnexa. 2. If proceed to surgical resection of adnexa plan would be laparascopic. However I worry in the face of ureteral obstruction, which is not due to the volume of the mass, chances are high for conversion to laparotomy which increases the morbidity and risk of complications in the postoperative setting. 3. Defer plan for ureteral stent to urology. 4. Transvaginal ultrasound ordered to get a sense for the vascularity/internal composition of the adnexa.    Isabel Caprice, MD  03/04/2018, 2:57 PM   Approximately 1 hour was spent  with the patient with >50% spent in face to face counseling. I performed a history, physical examination, and personally reviewed the patient's imaging films including the CTScan and ultrasound.

## 2018-03-05 LAB — HEPARIN LEVEL (UNFRACTIONATED): Heparin Unfractionated: 1.08 IU/mL — ABNORMAL HIGH (ref 0.30–0.70)

## 2018-03-05 LAB — CBC
HEMATOCRIT: 34.9 % — AB (ref 36.0–46.0)
Hemoglobin: 10.7 g/dL — ABNORMAL LOW (ref 12.0–15.0)
MCH: 27.2 pg (ref 26.0–34.0)
MCHC: 30.7 g/dL (ref 30.0–36.0)
MCV: 88.6 fL (ref 78.0–100.0)
PLATELETS: 236 10*3/uL (ref 150–400)
RBC: 3.94 MIL/uL (ref 3.87–5.11)
RDW: 13.3 % (ref 11.5–15.5)
WBC: 12.4 10*3/uL — AB (ref 4.0–10.5)

## 2018-03-05 LAB — APTT: aPTT: 119 seconds — ABNORMAL HIGH (ref 24–36)

## 2018-03-05 MED ORDER — SENNA 8.6 MG PO TABS
1.0000 | ORAL_TABLET | Freq: Every day | ORAL | Status: DC
  Administered 2018-03-05 – 2018-03-11 (×5): 8.6 mg via ORAL
  Filled 2018-03-05 (×6): qty 1

## 2018-03-05 MED ORDER — POLYETHYLENE GLYCOL 3350 17 G PO PACK
17.0000 g | PACK | Freq: Every day | ORAL | Status: DC
  Administered 2018-03-05 – 2018-03-11 (×5): 17 g via ORAL
  Filled 2018-03-05 (×6): qty 1

## 2018-03-05 NOTE — Progress Notes (Signed)
PROGRESS NOTE    [name] [name]  SEG:[id number] DOB: Nov 26, 1931 DOA: 03/03/2018 PCP: Jani Gravel, MD   Brief Narrative by Dr Sloan Leiter: [name] [name] is a 84 y.o. female with medical history significant of CAD status post remote CABG, atrial fibrillation on Eliquis, CVA without any residual deficits-presenting to the ED for evaluation of the above-noted complaints.  Per patient, approximately 2 weeks back-she started noticing pain in the right lower quadrant.  Pain initially resolved to over-the-counter medications-Tylenol/Motrin.  However pain gradually increased over the past few days.  Pain is dull, around 10/10 at its worse (per family) and without any radiation.  No particular aggravating factors, somewhat relieved by tramadol.  Patient does acknowledge some frequency of urination and dysuria.  Does not have any flank or back pain.  Patient went to her primary care doctor a few days back due to worsening right lower quadrant pain and was provided tramadol.  Unfortunately the pain continued to worsen, as a result the patient presented to the ED for further evaluation and treatment. Associated with nausea, and one episode of vomiting earlier this morning.   Assessment & Plan:   Principal Problem:   Intractable right lower quadrant abdominal pain Active Problems:   CAD (coronary artery disease)   Stroke with cerebral ischemia (HCC)   Atrial fibrillation (HCC)   Pulmonary hypertension (HCC)  1-Complex Masses Right adnexa and left ovary enlargement concern for ovarian neoplasm.  Intractable right lower Quadrant abdominal pain. Related to adnexal masses;  Pain controlled.  -CEA 10, elevated.  -CA 125 29 normal.  -US pelvis ordered.  -Appreciate Dr Gerarda Fraction evaluation she is recommending GI consultation given bilateral adnexal masses (concern with metastatic diseases), Elevated CEA and black stool for GI work up prior any surgical resection.  -Dr Hubbard Hartshorn consulted.  Plan for endoscopy,  colonoscopy 5-28  Anemia; ? Melena;  Continue to trend Hb.  Patient denies having BM .   Right Hydronephrosis, with adnexal Masses;  Cr stable.  Dr Matilde Sprang consulted. ? Stent at time of Sx.  Dr. Tresa Moore; think patient might need local resection, ureteral implant if patient is deem surgical candidate for resection  by GYN.   Chronic A fib; prior history of stroke.  On eliquis at home  Continue with heparin Gtt.  Monitor hb daily.   History of CVA; on eliquis at home  Continue with heparin.    DVT prophylaxis: Heparin Gtt Code Status: full code.  Family Communication: son in law at bedside.  Disposition Plan: to be determine , work up for adnexal mass in progress.    Consultants:   GI  GYN  Urology      Procedures:   US pelvis/    Antimicrobials:   none   Subjective: She is having nausea.  No BM since wednesday  Pain is controlled with pain medications.    Objective: Vitals:   03/04/18 1634 03/04/18 1638 03/04/18 2123 03/05/18 0500  BP: 129/70  (!) 152/88 (!) 164/91  Pulse: 67  (!) 105 81  Resp: 16  17 17   Temp: 98 F (36.7 C)  98.4 F (36.9 C) 98.4 F (36.9 C)  TempSrc: Oral  Oral Oral  SpO2: (!) 74% 90% (!) 88% 96%  Weight:      Height:        Intake/Output Summary (Last 24 hours) at 03/05/2018 0914 Last data filed at 03/05/2018 0829 Gross per 24 hour  Intake 945.9 ml  Output 550 ml  Net 395.9 ml   [employer]  03/03/18 1106  Weight: 57.2 kg (126 lb)    Examination:  General exam: NAD Respiratory system: CTA Cardiovascular system: S 1, S 2 RRR Gastrointestinal system: BS present, soft, mild tenderness.  Central nervous system: alert and oriented.  Extremities: no edema Skin: No rashes, lesions or ulcers    Data Reviewed: I have personally reviewed following labs and imaging studies  CBC: Recent Labs  Lab 03/03/18 1118 03/04/18 0356 03/05/18 0309  WBC 10.2 9.9 12.4*  HGB 12.2 11.6* 10.7*  HCT 39.5 37.4 34.9*  MCV  87.2 88.0 88.6  PLT 269 269 510   Basic Metabolic Panel: Recent Labs  Lab 03/03/18 1118 03/04/18 0356  NA 139 138  K 3.5 4.0  CL 103 105  CO2 26 25  GLUCOSE 120* 113*  BUN 21* 14  CREATININE 1.16* 1.19*  CALCIUM 9.5 9.0   GFR: Estimated Creatinine Clearance: 28.4 mL/min (A) (by C-G formula based on SCr of 1.19 mg/dL (H)). Liver Function Tests: Recent Labs  Lab 03/03/18 1118  AST 19  ALT 11*  ALKPHOS 55  BILITOT 0.7  PROT 6.9  ALBUMIN 3.8   Recent Labs  Lab 03/03/18 1118  LIPASE 31   No results for input(s): AMMONIA in the last 168 hours. Coagulation Profile: Recent Labs  Lab 03/04/18 0356  INR 1.06   Cardiac Enzymes: No results for input(s): CKTOTAL, CKMB, CKMBINDEX, TROPONINI in the last 168 hours. BNP (last 3 results) No results for input(s): PROBNP in the last 8760 hours. HbA1C: No results for input(s): HGBA1C in the last 72 hours. CBG: No results for input(s): GLUCAP in the last 168 hours. Lipid Profile: No results for input(s): CHOL, HDL, LDLCALC, TRIG, CHOLHDL, LDLDIRECT in the last 72 hours. Thyroid Function Tests: No results for input(s): TSH, T4TOTAL, FREET4, T3FREE, THYROIDAB in the last 72 hours. Anemia Panel: No results for input(s): VITAMINB12, FOLATE, FERRITIN, TIBC, IRON, RETICCTPCT in the last 72 hours. Sepsis Labs: No results for input(s): PROCALCITON, LATICACIDVEN in the last 168 hours.  No results found for this or any previous visit (from the past 240 hour(s)).       Radiology Studies: US Pelvis Transvanginal Non-ob (tv Only)  Result Date: 03/04/2018 CLINICAL DATA:  Right-sided ovarian mass on CT. EXAM: TRANSABDOMINAL AND TRANSVAGINAL ULTRASOUND OF PELVIS TECHNIQUE: Both transabdominal and transvaginal ultrasound examinations of the pelvis were performed. Transabdominal technique was performed for global imaging of the pelvis including uterus, ovaries, adnexal regions, and pelvic cul-de-sac. It was necessary to proceed with  endovaginal exam following the transabdominal exam to visualize the ovary/adnexa. COMPARISON:  CT of 1 day prior FINDINGS: Uterus Surgically absent Right ovary Measurements: 3.9 x 2.2 x 3.7 cm. Partially calcified right ovarian lesion of 2.3 x 1.9 x 1.4 cm, including on image 37. Just inferior to this, a partially cystic, partially solid hypoechoic lesion measures 1.6 x 1.2 x 0.9 cm. Left ovary Measurements: 4.9 x 4.4 x 4.2 cm. Primarily solid with central cystic component lesion measures 2.5 x 2.3 x 1.6 cm. Other findings Trace free pelvic fluid. There is also fluid adjacent the left ovary. IMPRESSION: 1. Bilateral ovarian lesions, including a calcified right-sided lesion and a dominant solid left-sided lesion. Suspicious for benign or malignant ovarian neoplasm(s). Consider gynecological consultation. If the patient is a surgical candidate, pre and postcontrast MRI may be informative. 2. Small volume cul-de-sac and left adnexal fluid. Electronically Signed   By: Abigail Miyamoto M.D.   On: 03/04/2018 15:21   US Pelvis (transabdominal Only)  Result  Date: 03/04/2018 CLINICAL DATA:  Right-sided ovarian mass on CT. EXAM: TRANSABDOMINAL AND TRANSVAGINAL ULTRASOUND OF PELVIS TECHNIQUE: Both transabdominal and transvaginal ultrasound examinations of the pelvis were performed. Transabdominal technique was performed for global imaging of the pelvis including uterus, ovaries, adnexal regions, and pelvic cul-de-sac. It was necessary to proceed with endovaginal exam following the transabdominal exam to visualize the ovary/adnexa. COMPARISON:  CT of 1 day prior FINDINGS: Uterus Surgically absent Right ovary Measurements: 3.9 x 2.2 x 3.7 cm. Partially calcified right ovarian lesion of 2.3 x 1.9 x 1.4 cm, including on image 37. Just inferior to this, a partially cystic, partially solid hypoechoic lesion measures 1.6 x 1.2 x 0.9 cm. Left ovary Measurements: 4.9 x 4.4 x 4.2 cm. Primarily solid with central cystic component lesion  measures 2.5 x 2.3 x 1.6 cm. Other findings Trace free pelvic fluid. There is also fluid adjacent the left ovary. IMPRESSION: 1. Bilateral ovarian lesions, including a calcified right-sided lesion and a dominant solid left-sided lesion. Suspicious for benign or malignant ovarian neoplasm(s). Consider gynecological consultation. If the patient is a surgical candidate, pre and postcontrast MRI may be informative. 2. Small volume cul-de-sac and left adnexal fluid. Electronically Signed   By: Abigail Miyamoto M.D.   On: 03/04/2018 15:21   Ct Abdomen Pelvis W Contrast  Result Date: 03/03/2018 CLINICAL DATA:  Abdominal pain, primarily on the right EXAM: CT ABDOMEN AND PELVIS WITH CONTRAST TECHNIQUE: Multidetector CT imaging of the abdomen and pelvis was performed using the standard protocol following bolus administration of intravenous contrast. Oral contrast was also administered. CONTRAST:  43mL OMNIPAQUE IOHEXOL 300 MG/ML  SOLN COMPARISON:  Abdominal ultrasound August 19, 2014 FINDINGS: Lower chest: There is bibasilar atelectasis. There are foci of coronary artery calcification. There is a sizable hiatal type hernia. Hepatobiliary: No focal liver lesions are evident on this noncontrast enhanced study. Gallbladder wall is not appreciably thickened. There is no biliary duct dilatation. Pancreas: There is no well-defined mass seen in the pancreas. No inflammatory focus evident. There are benign-appearing calcifications in the body of the pancreas which may represent residua of prior pancreatitis. No pancreatic duct dilatation evident. Spleen: No splenic lesions evident. Adrenals/Urinary Tract: Adrenals bilaterally appear normal. There is no renal mass on either side. There is decreased enhancement of the right kidney compared to the left kidney. There is severe hydronephrosis on the right. No hydronephrosis is evident on the left. There is no renal or ureteral calculus on either side. There is diffuse dilatation of the  left ureter to its distal aspect. In the region of the distal left ureter, there is wall thickening with what appears to be a right adnexal mass either impressing upon or frankly invading the right distal ureter. Urinary bladder is midline with wall thickness within normal limits. Stomach/Bowel: There is no appreciable bowel wall or mesenteric thickening. No evident bowel obstruction. No free air or portal venous air. Vascular/Lymphatic: There is atherosclerotic calcification in the aorta and common iliac arteries. There is also common femoral artery atherosclerosis bilaterally. No aneurysm evident. Major mesenteric arterial vessels appear patent. There is no appreciable adenopathy in the abdomen or pelvis. Reproductive: Uterus absent. There is a complex mass in the right adnexal region containing calcification. This mass measures 5.0 x 4.9 x 4.2 cm. There is enlargement of the left ovary with mixed attenuation measuring 4.8 x 4.2 x 3.9 cm. Other: There is a mass in the right adnexal region immediately adjacent to the larger mass arising from the right ovary measuring  2.5 x 2.4 x 2.4 cm. This mass may well represent an omental lesion immediately adjacent to the right ovary which is either invading or impressing upon the distal right ureter. No similar lesions elsewhere. No abscess or ascites is present in the abdomen or pelvis. Appendix not seen. It is conceivable that the mass in the right adnexal region represents an appendiceal neoplasm as opposed to an ovarian neoplasm. Musculoskeletal: There are pars defects bilaterally at L5 with 7 mm of anterolisthesis of L5 on S1. There is degenerative change in the lumbar spine. There are no blastic or lytic bone lesions. There is no intramuscular or abdominal wall lesion. IMPRESSION: 1. There are complex masses in the right adnexal region. One of these masses either extends into or compresses the distal right ureter. The larger of these right adnexal masses measures 5.0 x  4.9 x 4.2 cm. There is an adjacent mass measuring 2.5 x 2.4 x 2.4 cm. The appearance is concerning for ovarian neoplastic lesions on the right, possibly with an immediately adjacent omental lesion impressing upon or invading the right ureter. It should be noted that a normal appendix is not seen. A neoplastic lesion involving the appendix located in the right adnexal region must be considered a differential consideration. There is no inflammation in these areas. 2. Enlarged left ovary with inhomogeneous appearance. Concern for left ovarian neoplasm also present. 3. Severe hydronephrosis and ureterectasis on the right with decreased enhancement right kidney compared to the left consistent with the obstructing focus in the distal ureter region on the right. 4. No bowel obstruction. No abscess. No renal or ureteral calculi. No hydronephrosis. 5.  No adenopathy or liver lesions evident. 6. Aortoiliac atherosclerosis. Femoral artery atherosclerosis. There is coronary artery calcification as well. 7.  Pars defects at L5 bilaterally with spondylolisthesis at L5-S1. 8.  Uterus absent. 9.  Sizable hiatal type hernia. Comment: Pelvic MR nonemergently may be helpful for further delineation of the lesions in the right adnexal region. Aortic Atherosclerosis (ICD10-I70.0). Electronically Signed   By: Lowella Grip III M.D.   On: 03/03/2018 14:25        Scheduled Meds: . atorvastatin  10 mg Oral q1800  . metoprolol tartrate  75 mg Oral BID  . pantoprazole (PROTONIX) IV  40 mg Intravenous Q12H  . polyethylene glycol  17 g Oral Daily  . [START ON 03/06/2018] polyethylene glycol-electrolytes  4,000 mL Oral Once  . senna  1 tablet Oral Daily   Continuous Infusions: . sodium chloride 100 mL/hr at 03/04/18 1400  . sodium chloride 10 mL/hr at 03/04/18 1737  . heparin 800 Units/hr (03/05/18 0913)     LOS: 2 days    Time spent: 35 minutes.     Elmarie Shiley, MD [facility] Pager [phone] If  7PM-7AM, please contact night-coverage [email or web address] Password [facility] 03/05/2018, 9:14 AM

## 2018-03-05 NOTE — Progress Notes (Signed)
Subjective/Chief Complaint:  1 - Right Malignant Ureteral Obstruction - Rt moderate hydro with tortuous ureter to area of Rt adenexal mass by CT and [place] 02/2018. Very high concern for direct ureteral invasion / involvment as there is minimal mass effect for Rt ovrian mass and ureter appears to traverse directly through midst of mass.   2 - Bilateral Ovarian Masses - bilateral 3-4cm complex adnexal masses by CT and [place] 02/2018. Will likely need GYN-Onc resection, surgical plan pending. She has h/o benign vag-hyst for prolapse. NO other abd surgeries.   Cr 1.19, UA unremarkable.  Today "Agripina" is stable. UOP and Cr remain excellent.    Objective: Vital signs in last 24 hours: Temp:  [98 F (36.7 C)-98.4 F (36.9 C)] 98.4 F (36.9 C) (05/26 0500) Pulse Rate:  [67-105] 81 (05/26 0500) Resp:  [16-17] 17 (05/26 0500) BP: (129-164)/(70-91) 164/91 (05/26 0500) SpO2:  [74 %-96 %] 96 % (05/26 0500) Last BM Date: 03/02/18  Intake/Output from previous day: 05/25 0701 - 05/26 0700 In: 945.9 [I.V.:945.9] Out: 450 [Urine:450] Intake/Output this shift: Total I/O In: -  Out: 250 [Urine:250]  General appearance: alert, cooperative, appears stated age and son in law at bedside Eyes: negative Nose: Nares normal. Septum midline. Mucosa normal. No drainage or sinus tenderness. Throat: lips, mucosa, and tongue normal; teeth and gums normal Neck: supple, symmetrical, trachea midline Back: symmetric, no curvature. ROM normal. No CVA tenderness. Resp: non-labored on minimal [place] O2 Cardio: Nl rate GI: soft, non-tender; bowel sounds normal; no masses,  no organomegaly and no scars Extremities: extremities normal, atraumatic, no cyanosis or edema Skin: Skin color, texture, turgor normal. No rashes or lesions Lymph nodes: Cervical, supraclavicular, and axillary nodes normal. Neurologic: Grossly normal  Lab Results:  Recent Labs    03/04/18 0356 03/05/18 0309  WBC 9.9 12.4*  HGB 11.6* 10.7*   HCT 37.4 34.9*  PLT 269 236   BMET Recent Labs    03/03/18 1118 03/04/18 0356  NA 139 138  K 3.5 4.0  CL 103 105  CO2 26 25  GLUCOSE 120* 113*  BUN 21* 14  CREATININE 1.16* 1.19*  CALCIUM 9.5 9.0   PT/INR Recent Labs    03/04/18 0356  LABPROT 13.7  INR 1.06   ABG No results for input(s): PHART, HCO3 in the last 72 hours.  Invalid input(s): PCO2, PO2  Studies/Results: US Pelvis Transvanginal Non-ob (tv Only)  Result Date: 03/04/2018 CLINICAL DATA:  Right-sided ovarian mass on CT. EXAM: TRANSABDOMINAL AND TRANSVAGINAL ULTRASOUND OF PELVIS TECHNIQUE: Both transabdominal and transvaginal ultrasound examinations of the pelvis were performed. Transabdominal technique was performed for global imaging of the pelvis including uterus, ovaries, adnexal regions, and pelvic cul-de-sac. It was necessary to proceed with endovaginal exam following the transabdominal exam to visualize the ovary/adnexa. COMPARISON:  CT of 1 day prior FINDINGS: Uterus Surgically absent Right ovary Measurements: 3.9 x 2.2 x 3.7 cm. Partially calcified right ovarian lesion of 2.3 x 1.9 x 1.4 cm, including on image 37. Just inferior to this, a partially cystic, partially solid hypoechoic lesion measures 1.6 x 1.2 x 0.9 cm. Left ovary Measurements: 4.9 x 4.4 x 4.2 cm. Primarily solid with central cystic component lesion measures 2.5 x 2.3 x 1.6 cm. Other findings Trace free pelvic fluid. There is also fluid adjacent the left ovary. IMPRESSION: 1. Bilateral ovarian lesions, including a calcified right-sided lesion and a dominant solid left-sided lesion. Suspicious for benign or malignant ovarian neoplasm(s). Consider gynecological consultation. If the patient is  a surgical candidate, pre and postcontrast MRI may be informative. 2. Small volume cul-de-sac and left adnexal fluid. Electronically Signed   By: Abigail Miyamoto M.D.   On: 03/04/2018 15:21   US Pelvis (transabdominal Only)  Result Date: 03/04/2018 CLINICAL DATA:   Right-sided ovarian mass on CT. EXAM: TRANSABDOMINAL AND TRANSVAGINAL ULTRASOUND OF PELVIS TECHNIQUE: Both transabdominal and transvaginal ultrasound examinations of the pelvis were performed. Transabdominal technique was performed for global imaging of the pelvis including uterus, ovaries, adnexal regions, and pelvic cul-de-sac. It was necessary to proceed with endovaginal exam following the transabdominal exam to visualize the ovary/adnexa. COMPARISON:  CT of 1 day prior FINDINGS: Uterus Surgically absent Right ovary Measurements: 3.9 x 2.2 x 3.7 cm. Partially calcified right ovarian lesion of 2.3 x 1.9 x 1.4 cm, including on image 37. Just inferior to this, a partially cystic, partially solid hypoechoic lesion measures 1.6 x 1.2 x 0.9 cm. Left ovary Measurements: 4.9 x 4.4 x 4.2 cm. Primarily solid with central cystic component lesion measures 2.5 x 2.3 x 1.6 cm. Other findings Trace free pelvic fluid. There is also fluid adjacent the left ovary. IMPRESSION: 1. Bilateral ovarian lesions, including a calcified right-sided lesion and a dominant solid left-sided lesion. Suspicious for benign or malignant ovarian neoplasm(s). Consider gynecological consultation. If the patient is a surgical candidate, pre and postcontrast MRI may be informative. 2. Small volume cul-de-sac and left adnexal fluid. Electronically Signed   By: Abigail Miyamoto M.D.   On: 03/04/2018 15:21   Ct Abdomen Pelvis W Contrast  Result Date: 03/03/2018 CLINICAL DATA:  Abdominal pain, primarily on the right EXAM: CT ABDOMEN AND PELVIS WITH CONTRAST TECHNIQUE: Multidetector CT imaging of the abdomen and pelvis was performed using the standard protocol following bolus administration of intravenous contrast. Oral contrast was also administered. CONTRAST:  67mL OMNIPAQUE IOHEXOL 300 MG/ML  SOLN COMPARISON:  Abdominal ultrasound August 19, 2014 FINDINGS: Lower chest: There is bibasilar atelectasis. There are foci of coronary artery calcification. There  is a sizable hiatal type hernia. Hepatobiliary: No focal liver lesions are evident on this noncontrast enhanced study. Gallbladder wall is not appreciably thickened. There is no biliary duct dilatation. Pancreas: There is no well-defined mass seen in the pancreas. No inflammatory focus evident. There are benign-appearing calcifications in the body of the pancreas which may represent residua of prior pancreatitis. No pancreatic duct dilatation evident. Spleen: No splenic lesions evident. Adrenals/Urinary Tract: Adrenals bilaterally appear normal. There is no renal mass on either side. There is decreased enhancement of the right kidney compared to the left kidney. There is severe hydronephrosis on the right. No hydronephrosis is evident on the left. There is no renal or ureteral calculus on either side. There is diffuse dilatation of the left ureter to its distal aspect. In the region of the distal left ureter, there is wall thickening with what appears to be a right adnexal mass either impressing upon or frankly invading the right distal ureter. Urinary bladder is midline with wall thickness within normal limits. Stomach/Bowel: There is no appreciable bowel wall or mesenteric thickening. No evident bowel obstruction. No free air or portal venous air. Vascular/Lymphatic: There is atherosclerotic calcification in the aorta and common iliac arteries. There is also common femoral artery atherosclerosis bilaterally. No aneurysm evident. Major mesenteric arterial vessels appear patent. There is no appreciable adenopathy in the abdomen or pelvis. Reproductive: Uterus absent. There is a complex mass in the right adnexal region containing calcification. This mass measures 5.0 x 4.9 x 4.2  cm. There is enlargement of the left ovary with mixed attenuation measuring 4.8 x 4.2 x 3.9 cm. Other: There is a mass in the right adnexal region immediately adjacent to the larger mass arising from the right ovary measuring 2.5 x 2.4 x 2.4  cm. This mass may well represent an omental lesion immediately adjacent to the right ovary which is either invading or impressing upon the distal right ureter. No similar lesions elsewhere. No abscess or ascites is present in the abdomen or pelvis. Appendix not seen. It is conceivable that the mass in the right adnexal region represents an appendiceal neoplasm as opposed to an ovarian neoplasm. Musculoskeletal: There are pars defects bilaterally at L5 with 7 mm of anterolisthesis of L5 on S1. There is degenerative change in the lumbar spine. There are no blastic or lytic bone lesions. There is no intramuscular or abdominal wall lesion. IMPRESSION: 1. There are complex masses in the right adnexal region. One of these masses either extends into or compresses the distal right ureter. The larger of these right adnexal masses measures 5.0 x 4.9 x 4.2 cm. There is an adjacent mass measuring 2.5 x 2.4 x 2.4 cm. The appearance is concerning for ovarian neoplastic lesions on the right, possibly with an immediately adjacent omental lesion impressing upon or invading the right ureter. It should be noted that a normal appendix is not seen. A neoplastic lesion involving the appendix located in the right adnexal region must be considered a differential consideration. There is no inflammation in these areas. 2. Enlarged left ovary with inhomogeneous appearance. Concern for left ovarian neoplasm also present. 3. Severe hydronephrosis and ureterectasis on the right with decreased enhancement right kidney compared to the left consistent with the obstructing focus in the distal ureter region on the right. 4. No bowel obstruction. No abscess. No renal or ureteral calculi. No hydronephrosis. 5.  No adenopathy or liver lesions evident. 6. Aortoiliac atherosclerosis. Femoral artery atherosclerosis. There is coronary artery calcification as well. 7.  Pars defects at L5 bilaterally with spondylolisthesis at L5-S1. 8.  Uterus absent. 9.   Sizable hiatal type hernia. Comment: Pelvic MR nonemergently may be helpful for further delineation of the lesions in the right adnexal region. Aortic Atherosclerosis (ICD10-I70.0). Electronically Signed   By: Lowella Grip III M.D.   On: 03/03/2018 14:25    Anti-infectives: Anti-infectives (From admission, onward)   None      Assessment/Plan:  1 - Right Malignant Ureteral Obstruction - I am HIGHLY concerned Rt ureter is directly involved with mass and may require local resection with formal ureteral reimplant IF pt deemed surgical candidate for resection by GYN-ONC team.   I would recommend robotic approach to bilateral oophorectomy as this would allow maximim chance of minimally invasive ureteral reconstruction / psoas hitch given need for significant suturing.   Would rec combined team surgery with urology cysto / Rt retrograde / Possible Rt ureteroscopy and stent (29min) followed by GYN-ONC bilateral robotic oophorectomy / (?node dissection) and then Urol Rt ureteral reimplant / psoas hitch (69min) if necessary. I would be happy to perform with GYN-ONC colleagues in elective setting.   2 - Bilateral Ovarian Masses - final plan per GYN-ONC, but agree with GI eval this admission and then likely combined team Urol-GYN surgery in elective setting.   [facility], Arvie Villarruel 03/05/2018

## 2018-03-05 NOTE — Progress Notes (Signed)
ANTICOAGULATION CONSULT NOTE - Follow up [place] for Heparin Indication: atrial fibrillation - apixaban on hold  No Known Allergies  Patient Measurements: Height: 5' (152.4 cm) Weight: 126 lb (57.2 kg) IBW/kg (Calculated) : 45.5 Heparin Dosing Weight: 57  Vital Signs: Temp: 98.4 F (36.9 C) (05/26 0500) Temp Source: Oral (05/26 0500) BP: 164/91 (05/26 0500) Pulse Rate: 81 (05/26 0500)  Labs: Recent Labs    03/03/18 1118 03/03/18 2329 03/03/18 2345 03/04/18 0356 03/04/18 1205 03/05/18 0309  HGB 12.2  --   --  11.6*  --  10.7*  HCT 39.5  --   --  37.4  --  34.9*  PLT 269  --   --  269  --  236  APTT  --   --  52*  --  93* 119*  LABPROT  --   --   --  13.7  --   --   INR  --   --   --  1.06  --   --   HEPARINUNFRC  --  1.20*  --   --   --  1.08*  CREATININE 1.16*  --   --  1.19*  --   --    Estimated Creatinine Clearance: 28.4 mL/min (A) (by C-G formula based on SCr of 1.19 mg/dL (H)).  Medical History: Past Medical History:  Diagnosis Date  . CAD (coronary artery disease)    multivessel  . H/O atrial fibrillation   . H/O: CVA (cerebrovascular accident)    presented 20 with slurred speech. No residual deficit  . Hiatal hernia   . Hypercholesterolemia   . Hypertension   . Iron deficiency   . Osteopenia   . Spasmodic dysphonia   . Vitamin B12 deficiency    Assessment: 84yo female presents with c/o abdominal pain and CT results noted for mass.  Pharmacy consulted for heparin dosing. She is on oral anticoagulation with apixaban prior to admit at renal dose adjusted 2.5mg  bid.  CBC low and trending down, and intermittent melena, with no signs/sx of GI bleed per GI team. She has some age related renal decline with an estimated crcl ~ 33ml/min.   Last apixaban dose on 5/24.  Heparin level is not correlating with aPTT currently d/t apixaban, but trending down appropriately; heparin level supratherapeutic at 1.2>1.08 and aPTT now supratherapeutic,  119 seconds after rate increase yesterday.  Per GI: plan to hold heparin on 5/28 at 0500 for GI procedures  Goal of Therapy:  Heparin level 0.3-0.7 units/ml  APTT 66-102 Monitor platelets by anticoagulation protocol: Yes   Plan:  Decrease Heparin infusion to 800 units/hr Check anti-Xa level/aPTT daily while on heparin Continue to monitor H&H and platelets  Nida Boatman, PharmD PGY1 Acute Care Pharmacy Resident 03/05/2018 7:31 AM

## 2018-03-05 NOTE — Progress Notes (Signed)
Gyn Onc Followup:   S: No current pain or nausea.  O: Vitals:   03/04/18 2123 03/05/18 0500  BP: (!) 152/88 (!) 164/91  Pulse: (!) 105 81  Resp: 17 17  Temp: 98.4 F (36.9 C) 98.4 F (36.9 C)  SpO2: (!) 88% 96%   CT previously reviewed as well as tumor markers. Bilateral adnexal masses with hydroureter/nephrosis on right.  CA125 normal CEA 10+ (elevated)  A/P: 1. Given bilateral adnexal masses (which increases concern for metastatic disease), elevated CEA, nausea, and black stools GI plans endoscopy (upper/lower) 5/28 2. If GI workup negative discuss IR biopsy versus directly proceeding to surgical resection of masses. IR mentioned omental disease, but I am not seeing this. Goal would be diagnosis for patient decision making. 3. If proceed to surgical resection of adnexa plan would be laparascopic. However I worry in the face of ureteral obstruction, which is not due to the volume of the mass, chances are high for conversion to laparotomy which increases the morbidity and risk of complications in the postoperative setting. Have OR time 5/30 at [facility]. May need to coordinate with urology for stent/other. 4. Defer plan for ureteral stent to urology. 5. Transvaginal ultrasound not informative as far as predicting probability of malignancy. There was no abnormal flow in the masses on the images I reviewed.  Precious Haws, MD Gyn [phone] (pager)

## 2018-03-06 LAB — BASIC METABOLIC PANEL
ANION GAP: 8 (ref 5–15)
BUN: 12 mg/dL (ref 6–20)
CALCIUM: 8.8 mg/dL — AB (ref 8.9–10.3)
CO2: 24 mmol/L (ref 22–32)
Chloride: 107 mmol/L (ref 101–111)
Creatinine, Ser: 1.16 mg/dL — ABNORMAL HIGH (ref 0.44–1.00)
GFR calc Af Amer: 49 mL/min — ABNORMAL LOW (ref >60)
GFR, EST NON AFRICAN AMERICAN: 42 mL/min — AB (ref >60)
GLUCOSE: 106 mg/dL — AB (ref 65–99)
POTASSIUM: 4.2 mmol/L (ref 3.5–5.1)
Sodium: 139 mmol/L (ref 135–145)

## 2018-03-06 LAB — CBC
HEMATOCRIT: 34.1 % — AB (ref 36.0–46.0)
HEMOGLOBIN: 10.4 g/dL — AB (ref 12.0–15.0)
MCH: 27.1 pg (ref 26.0–34.0)
MCHC: 30.5 g/dL (ref 30.0–36.0)
MCV: 88.8 fL (ref 78.0–100.0)
Platelets: 230 10*3/uL (ref 150–400)
RBC: 3.84 MIL/uL — AB (ref 3.87–5.11)
RDW: 13.3 % (ref 11.5–15.5)
WBC: 11.2 10*3/uL — ABNORMAL HIGH (ref 4.0–10.5)

## 2018-03-06 LAB — APTT: aPTT: 88 seconds — ABNORMAL HIGH (ref 24–36)

## 2018-03-06 LAB — HEPARIN LEVEL (UNFRACTIONATED): HEPARIN UNFRACTIONATED: 0.62 [IU]/mL (ref 0.30–0.70)

## 2018-03-06 MED ORDER — METOPROLOL TARTRATE 5 MG/5ML IV SOLN
2.5000 mg | Freq: Four times a day (QID) | INTRAVENOUS | Status: DC | PRN
  Filled 2018-03-06: qty 5

## 2018-03-06 NOTE — Progress Notes (Signed)
CCMD notified this RN that pt's HR went into the 160'2 and is sustaining in the 140's. Pt assessed and is asymptomatic. MD Regalado notified and IV metoprolol ordered PRN. Will continue to assess.

## 2018-03-06 NOTE — Progress Notes (Addendum)
ANTICOAGULATION CONSULT NOTE - Follow up [place] for Heparin Indication: atrial fibrillation - apixaban on hold  No Known Allergies  Patient Measurements: Height: 5' (152.4 cm) Weight: 126 lb (57.2 kg) IBW/kg (Calculated) : 45.5 Heparin Dosing Weight: 57  Vital Signs: Temp: 98.5 F (36.9 C) (05/27 0607) Temp Source: Oral (05/27 0607) BP: 139/78 (05/27 0607) Pulse Rate: 72 (05/27 0607)  Labs: Recent Labs    03/03/18 1118 03/03/18 2329  03/04/18 0356 03/04/18 1205 03/05/18 0309 03/06/18 0144  HGB 12.2  --   --  11.6*  --  10.7* 10.4*  HCT 39.5  --   --  37.4  --  34.9* 34.1*  PLT 269  --   --  269  --  236 230  APTT  --   --    < >  --  93* 119* 88*  LABPROT  --   --   --  13.7  --   --   --   INR  --   --   --  1.06  --   --   --   HEPARINUNFRC  --  1.20*  --   --   --  1.08* 0.62  CREATININE 1.16*  --   --  1.19*  --   --  1.16*   < > = values in this interval not displayed.   Estimated Creatinine Clearance: 29.1 mL/min (A) (by C-G formula based on SCr of 1.16 mg/dL (H)).  Medical History: Past Medical History:  Diagnosis Date  . CAD (coronary artery disease)    multivessel  . H/O atrial fibrillation   . H/O: CVA (cerebrovascular accident)    presented 72 with slurred speech. No residual deficit  . Hiatal hernia   . Hypercholesterolemia   . Hypertension   . Iron deficiency   . Osteopenia   . Spasmodic dysphonia   . Vitamin B12 deficiency    Assessment: 84yo female presents with c/o abdominal pain and CT results noted for mass.  Pharmacy consulted for heparin dosing. She is on oral anticoagulation with apixaban prior to admit at renal dose adjusted 2.5mg  bid.  CBC low and trending down, and intermittent melena, with no signs/sx of GI bleed per GI team. She has some age related renal decline with an estimated crcl ~ 70ml/min.   Last apixaban dose on 5/24.  Heparin level and aPTT correlating now and both therapeutic. Now will only check  Heparin levels. Pushed heparin level to 5/29 since getting GI procedure tomorrow.  Per GI: plan to hold heparin on 5/28 at 0500 for GI procedures  Goal of Therapy:  Heparin level 0.3-0.7 units/ml  Monitor platelets by anticoagulation protocol: Yes   Plan:  Continue Heparin infusion to 800 units/hr Check heparin level daily Continue to monitor H&H and platelets  Nida Boatman, PharmD PGY1 Acute Care Pharmacy Resident 03/06/2018 8:09 AM

## 2018-03-06 NOTE — Progress Notes (Signed)
Patient ID: [name] [name], female   DOB: [birth year], 84 y.o.   MRN: [id number]  Sitting in bedside chair. Son-in-law in room.  Colon prep to start this afternoon for EGD/colonoscopy tomorrow morning. IV Heparin ordered to be held 4 hours prior to scheduled procedure. Encouraged her to complete the entire colon prep.  Procedures scheduled for tomorrow with Dr. Paulita Fujita.

## 2018-03-06 NOTE — Progress Notes (Signed)
PROGRESS NOTE    [name] [name]  KJZ:[id number] DOB: Aug 01, 1932 DOA: 03/03/2018 PCP: Jani Gravel, MD   Brief Narrative by Dr Sloan Leiter: [name] [name] is a 84 y.o. female with medical history significant of CAD status post remote CABG, atrial fibrillation on Eliquis, CVA without any residual deficits-presenting to the ED for evaluation of the above-noted complaints.  Per patient, approximately 2 weeks back-she started noticing pain in the right lower quadrant.  Pain initially resolved to over-the-counter medications-Tylenol/Motrin.  However pain gradually increased over the past few days.  Pain is dull, around 10/10 at its worse (per family) and without any radiation.  No particular aggravating factors, somewhat relieved by tramadol.  Patient does acknowledge some frequency of urination and dysuria.  Does not have any flank or back pain.  Patient went to her primary care doctor a few days back due to worsening right lower quadrant pain and was provided tramadol.  Unfortunately the pain continued to worsen, as a result the patient presented to the ED for further evaluation and treatment. Associated with nausea, and one episode of vomiting earlier this morning.   Assessment & Plan:   Principal Problem:   Intractable right lower quadrant abdominal pain Active Problems:   CAD (coronary artery disease)   Stroke with cerebral ischemia (HCC)   Atrial fibrillation (HCC)   Pulmonary hypertension (HCC)  1-Complex Masses Right adnexa and left ovary enlargement concern for ovarian neoplasm.  Intractable right lower Quadrant abdominal pain. Related to adnexal masses:  Pain controlled.  -CEA 10, elevated.  -CA 125 29 normal.  -US pelvis ordered.  -Appreciate Dr Gerarda Fraction evaluation she is recommending GI consultation given bilateral adnexal masses (concern with metastatic diseases), Elevated CEA and black stool for GI work up prior any surgical resection.  Ig GI work up negative Dr Gerarda Fraction will need to  discussed with family biopsy by IR vs Sx.  -Dr Hubbard Hartshorn consulted.  Plan for endoscopy, colonoscopy 5-28  Anemia; ? Melena;  Continue to trend Hb.  Patient denies having BM .   Right Hydronephrosis, with adnexal Masses;  Cr stable.  Dr Matilde Sprang consulted. ? Stent at time of Sx.  Dr. Tresa Moore; think patient might need local resection, ureteral implant if patient is deem surgical candidate for resection  by GYN.   A fib RVR;  Chronic A fib; prior history of stroke.  On eliquis at home  Continue with heparin Gtt.  Monitor hb daily.  HR elevated this am. IV metoprolol ordered PRN. Continue woth metoprolol. If rate is not controlled could start cardizem gtt   History of CVA; on eliquis at home  Continue with heparin.    DVT prophylaxis: Heparin Gtt Code Status: full code.  Family Communication: son in law at bedside.  Disposition Plan: to be determine , work up for adnexal mass in progress.    Consultants:   GI  GYN  Urology      Procedures:   US pelvis/    Antimicrobials:   none   Subjective: patient sitting in bed, denies dyspnea, chest pain , no BM yet.   Objective: Vitals:   03/05/18 2239 03/05/18 2242 03/05/18 2309 03/06/18 0607  BP: (!) 147/101 (!) 164/76 (!) 159/91 139/78  Pulse: 83 71  72  Resp: 17   16  Temp: 98.2 F (36.8 C)   98.5 F (36.9 C)  TempSrc:    Oral  SpO2: 98%   98%  Weight:      Height:  Intake/Output Summary (Last 24 hours) at 03/06/2018 0914 Last data filed at 03/05/2018 2013 Gross per 24 hour  Intake 103.17 ml  Output 900 ml  Net -796.83 ml   Filed Weights   03/03/18 1106  Weight: 57.2 kg (126 lb)    Examination:  General exam: NAD Respiratory system: CTA Cardiovascular system: S 1, S 2 RRR Gastrointestinal system: BS present, soft, mild tender.  Central nervous system: non focal.  Extremities: No edema Skin: no rash     Data Reviewed: I have personally reviewed following labs and imaging  studies  CBC: Recent Labs  Lab 03/03/18 1118 03/04/18 0356 03/05/18 0309 03/06/18 0144  WBC 10.2 9.9 12.4* 11.2*  HGB 12.2 11.6* 10.7* 10.4*  HCT 39.5 37.4 34.9* 34.1*  MCV 87.2 88.0 88.6 88.8  PLT 269 269 236 001   Basic Metabolic Panel: Recent Labs  Lab 03/03/18 1118 03/04/18 0356 03/06/18 0144  NA 139 138 139  K 3.5 4.0 4.2  CL 103 105 107  CO2 26 25 24   GLUCOSE 120* 113* 106*  BUN 21* 14 12  CREATININE 1.16* 1.19* 1.16*  CALCIUM 9.5 9.0 8.8*   GFR: Estimated Creatinine Clearance: 29.1 mL/min (A) (by C-G formula based on SCr of 1.16 mg/dL (H)). Liver Function Tests: Recent Labs  Lab 03/03/18 1118  AST 19  ALT 11*  ALKPHOS 55  BILITOT 0.7  PROT 6.9  ALBUMIN 3.8   Recent Labs  Lab 03/03/18 1118  LIPASE 31   No results for input(s): AMMONIA in the last 168 hours. Coagulation Profile: Recent Labs  Lab 03/04/18 0356  INR 1.06   Cardiac Enzymes: No results for input(s): CKTOTAL, CKMB, CKMBINDEX, TROPONINI in the last 168 hours. BNP (last 3 results) No results for input(s): PROBNP in the last 8760 hours. HbA1C: No results for input(s): HGBA1C in the last 72 hours. CBG: No results for input(s): GLUCAP in the last 168 hours. Lipid Profile: No results for input(s): CHOL, HDL, LDLCALC, TRIG, CHOLHDL, LDLDIRECT in the last 72 hours. Thyroid Function Tests: No results for input(s): TSH, T4TOTAL, FREET4, T3FREE, THYROIDAB in the last 72 hours. Anemia Panel: No results for input(s): VITAMINB12, FOLATE, FERRITIN, TIBC, IRON, RETICCTPCT in the last 72 hours. Sepsis Labs: No results for input(s): PROCALCITON, LATICACIDVEN in the last 168 hours.  No results found for this or any previous visit (from the past 240 hour(s)).       Radiology Studies: US Pelvis Transvanginal Non-ob (tv Only)  Result Date: 03/04/2018 CLINICAL DATA:  Right-sided ovarian mass on CT. EXAM: TRANSABDOMINAL AND TRANSVAGINAL ULTRASOUND OF PELVIS TECHNIQUE: Both transabdominal and  transvaginal ultrasound examinations of the pelvis were performed. Transabdominal technique was performed for global imaging of the pelvis including uterus, ovaries, adnexal regions, and pelvic cul-de-sac. It was necessary to proceed with endovaginal exam following the transabdominal exam to visualize the ovary/adnexa. COMPARISON:  CT of 1 day prior FINDINGS: Uterus Surgically absent Right ovary Measurements: 3.9 x 2.2 x 3.7 cm. Partially calcified right ovarian lesion of 2.3 x 1.9 x 1.4 cm, including on image 37. Just inferior to this, a partially cystic, partially solid hypoechoic lesion measures 1.6 x 1.2 x 0.9 cm. Left ovary Measurements: 4.9 x 4.4 x 4.2 cm. Primarily solid with central cystic component lesion measures 2.5 x 2.3 x 1.6 cm. Other findings Trace free pelvic fluid. There is also fluid adjacent the left ovary. IMPRESSION: 1. Bilateral ovarian lesions, including a calcified right-sided lesion and a dominant solid left-sided lesion. Suspicious for benign or  malignant ovarian neoplasm(s). Consider gynecological consultation. If the patient is a surgical candidate, pre and postcontrast MRI may be informative. 2. Small volume cul-de-sac and left adnexal fluid. Electronically Signed   By: Abigail Miyamoto M.D.   On: 03/04/2018 15:21   US Pelvis (transabdominal Only)  Result Date: 03/04/2018 CLINICAL DATA:  Right-sided ovarian mass on CT. EXAM: TRANSABDOMINAL AND TRANSVAGINAL ULTRASOUND OF PELVIS TECHNIQUE: Both transabdominal and transvaginal ultrasound examinations of the pelvis were performed. Transabdominal technique was performed for global imaging of the pelvis including uterus, ovaries, adnexal regions, and pelvic cul-de-sac. It was necessary to proceed with endovaginal exam following the transabdominal exam to visualize the ovary/adnexa. COMPARISON:  CT of 1 day prior FINDINGS: Uterus Surgically absent Right ovary Measurements: 3.9 x 2.2 x 3.7 cm. Partially calcified right ovarian lesion of 2.3 x  1.9 x 1.4 cm, including on image 37. Just inferior to this, a partially cystic, partially solid hypoechoic lesion measures 1.6 x 1.2 x 0.9 cm. Left ovary Measurements: 4.9 x 4.4 x 4.2 cm. Primarily solid with central cystic component lesion measures 2.5 x 2.3 x 1.6 cm. Other findings Trace free pelvic fluid. There is also fluid adjacent the left ovary. IMPRESSION: 1. Bilateral ovarian lesions, including a calcified right-sided lesion and a dominant solid left-sided lesion. Suspicious for benign or malignant ovarian neoplasm(s). Consider gynecological consultation. If the patient is a surgical candidate, pre and postcontrast MRI may be informative. 2. Small volume cul-de-sac and left adnexal fluid. Electronically Signed   By: Abigail Miyamoto M.D.   On: 03/04/2018 15:21        Scheduled Meds: . atorvastatin  10 mg Oral q1800  . metoprolol tartrate  75 mg Oral BID  . pantoprazole (PROTONIX) IV  40 mg Intravenous Q12H  . polyethylene glycol  17 g Oral Daily  . polyethylene glycol-electrolytes  4,000 mL Oral Once  . senna  1 tablet Oral Daily   Continuous Infusions: . sodium chloride 10 mL/hr at 03/04/18 1737  . heparin 800 Units/hr (03/06/18 0347)     LOS: 3 days    Time spent: 35 minutes.     Elmarie Shiley, MD [facility] Pager [phone] If 7PM-7AM, please contact night-coverage [email or web address] Password [facility] 03/06/2018, 9:14 AM

## 2018-03-06 NOTE — Progress Notes (Signed)
Pt unable to drink colon prep at ordered rate. Pt instructed to drink as much as she can and RN will continue to assess.

## 2018-03-07 ENCOUNTER — Encounter (HOSPITAL_COMMUNITY)
Admission: EM | Disposition: A | Discharge status: Home / Self Care | Payer: Self-pay | Source: Home / Self Care | Attending: Internal Medicine

## 2018-03-07 ENCOUNTER — Inpatient Hospital Stay (HOSPITAL_COMMUNITY): Payer: Medicare Other | Admitting: Certified Registered"

## 2018-03-07 ENCOUNTER — Encounter (HOSPITAL_COMMUNITY): Payer: Self-pay

## 2018-03-07 HISTORY — PX: COLONOSCOPY WITH PROPOFOL: SHX5780

## 2018-03-07 HISTORY — PX: ESOPHAGOGASTRODUODENOSCOPY (EGD) WITH PROPOFOL: SHX5813

## 2018-03-07 LAB — CBC
HEMATOCRIT: 32.3 % — AB (ref 36.0–46.0)
Hemoglobin: 9.9 g/dL — ABNORMAL LOW (ref 12.0–15.0)
MCH: 27.2 pg (ref 26.0–34.0)
MCHC: 30.7 g/dL (ref 30.0–36.0)
MCV: 88.7 fL (ref 78.0–100.0)
Platelets: 232 10*3/uL (ref 150–400)
RBC: 3.64 MIL/uL — ABNORMAL LOW (ref 3.87–5.11)
RDW: 13.3 % (ref 11.5–15.5)
WBC: 9.4 10*3/uL (ref 4.0–10.5)

## 2018-03-07 LAB — HEPARIN LEVEL (UNFRACTIONATED): Heparin Unfractionated: 0.28 IU/mL — ABNORMAL LOW (ref 0.30–0.70)

## 2018-03-07 SURGERY — ESOPHAGOGASTRODUODENOSCOPY (EGD) WITH PROPOFOL
Anesthesia: Monitor Anesthesia Care

## 2018-03-07 MED ORDER — LACTATED RINGERS IV SOLN: INTRAVENOUS | Status: DC

## 2018-03-07 MED ORDER — LACTATED RINGERS IV SOLN
INTRAVENOUS | Status: DC | PRN
  Administered 2018-03-07: 10:00:00 via INTRAVENOUS

## 2018-03-07 MED ORDER — PROPOFOL 10 MG/ML IV BOLUS
INTRAVENOUS | Status: DC | PRN
  Administered 2018-03-07: 10 mg via INTRAVENOUS
  Administered 2018-03-07: 20 mg via INTRAVENOUS
  Administered 2018-03-07: 10 mg via INTRAVENOUS

## 2018-03-07 MED ORDER — PROPOFOL 500 MG/50ML IV EMUL
INTRAVENOUS | Status: DC | PRN
  Administered 2018-03-07: 75 ug/kg/min via INTRAVENOUS

## 2018-03-07 MED ORDER — HEPARIN (PORCINE) IN NACL 100-0.45 UNIT/ML-% IJ SOLN
850.0000 [IU]/h | INTRAMUSCULAR | Status: DC
  Administered 2018-03-07: 800 [IU]/h via INTRAVENOUS
  Filled 2018-03-07: qty 250

## 2018-03-07 SURGICAL SUPPLY — 25 items

## 2018-03-07 NOTE — Anesthesia Preprocedure Evaluation (Addendum)
Anesthesia Evaluation  Patient identified by MRN, date of birth, ID band Patient awake    Reviewed: Allergy & Precautions, NPO status , Patient's Chart, lab work & pertinent test results  Airway Mallampati: II  TM Distance: >3 FB Neck ROM: Full    Dental   Pulmonary former smoker,    breath sounds clear to auscultation       Cardiovascular hypertension, Pt. on medications and Pt. on home beta blockers + CAD and + CABG  + dysrhythmias Atrial Fibrillation  Rhythm:Irregular Rate:Normal     Neuro/Psych CVA    GI/Hepatic Neg liver ROS, GI bleed   Endo/Other  negative endocrine ROS  Renal/GU Renal disease     Musculoskeletal   Abdominal   Peds  Hematology  (+) anemia ,   Anesthesia Other Findings   Reproductive/Obstetrics Bilateral adenexal masses                            Lab Results  Component Value Date   WBC 9.4 03/07/2018   HGB 9.9 (L) 03/07/2018   HCT 32.3 (L) 03/07/2018   MCV 88.7 03/07/2018   PLT 232 03/07/2018   Lab Results  Component Value Date   CREATININE 1.16 (H) 03/06/2018   BUN 12 03/06/2018   NA 139 03/06/2018   K 4.2 03/06/2018   CL 107 03/06/2018   CO2 24 03/06/2018    Anesthesia Physical Anesthesia Plan  ASA: III  Anesthesia Plan: MAC   Post-op Pain Management:    Induction: Intravenous  PONV Risk Score and Plan: 2 and Propofol infusion, Ondansetron and Treatment may vary due to age or medical condition  Airway Management Planned: Natural Airway and Nasal Cannula  Additional Equipment:   Intra-op Plan:   Post-operative Plan:   Informed Consent: I have reviewed the patients History and Physical, chart, labs and discussed the procedure including the risks, benefits and alternatives for the proposed anesthesia with the patient or authorized representative who has indicated his/her understanding and acceptance.     Plan Discussed with:   Anesthesia  Plan Comments:         Anesthesia Quick Evaluation

## 2018-03-07 NOTE — Progress Notes (Signed)
Patient seen and examined yesterday.  Discussed images today with radiology. There are several peritoneal lesions that are accessible to IR.  Therefore if GI scopes today are negative I recommend IR for CT guided CORE biopsy of peritoneal or omental lesions.  We will try to contact GI to get a preliminary sense of their findings and order biopsy if appropriate.  Precious Haws, MD [facility]yn [facility] [place])

## 2018-03-07 NOTE — Anesthesia Postprocedure Evaluation (Signed)
Anesthesia Post Note  Patient: [name] [name]  Procedure(s) Performed: ESOPHAGOGASTRODUODENOSCOPY (EGD) WITH PROPOFOL (N/A ) COLONOSCOPY WITH PROPOFOL (N/A )     Patient location during evaluation: PACU Anesthesia Type: MAC Level of consciousness: awake and alert Pain management: pain level controlled Vital Signs Assessment: post-procedure vital signs reviewed and stable Respiratory status: spontaneous breathing, nonlabored ventilation, respiratory function stable and patient connected to nasal cannula oxygen Cardiovascular status: stable and blood pressure returned to baseline Postop Assessment: no apparent nausea or vomiting Anesthetic complications: no    Last Vitals:  Vitals:   03/07/18 1045 03/07/18 1055  BP: (!) 110/56 124/71  Pulse: 81 (!) 38  Resp: (!) 24 (!) 25  Temp: 36.5 C   SpO2: 95% 98%    Last Pain:  Vitals:   03/07/18 1055  TempSrc:   PainSc: 0-No pain                 Tiajuana Amass

## 2018-03-07 NOTE — Progress Notes (Signed)
PROGRESS NOTE    [name] [name]  ZOX:[id number] DOB: 1932-07-09 DOA: 03/03/2018 PCP: Jani Gravel, MD   Brief Narrative by Dr Sloan Leiter: [name] [name] is a 84 y.o. female with medical history significant of CAD status post remote CABG, atrial fibrillation on Eliquis, CVA without any residual deficits-presenting to the ED for evaluation of the above-noted complaints.  Per patient, approximately 2 weeks back-she started noticing pain in the right lower quadrant.  Pain initially resolved to over-the-counter medications-Tylenol/Motrin.  However pain gradually increased over the past few days.  Pain is dull, around 10/10 at its worse (per family) and without any radiation.  No particular aggravating factors, somewhat relieved by tramadol.  Patient does acknowledge some frequency of urination and dysuria.  Does not have any flank or back pain.  Patient went to her primary care doctor a few days back due to worsening right lower quadrant pain and was provided tramadol.  Unfortunately the pain continued to worsen, as a result the patient presented to the ED for further evaluation and treatment. Associated with nausea, and one episode of vomiting earlier this morning.  Patient was found to have bilateral adnexal masses and right side hydronephrosis from ureter obstruction.  GYN-oncology Dr Gerarda Fraction and urology was consulted. Dr Gerarda Fraction was recommending GI work up with endoscopy, colonoscopy to rule out GI malignancy. She underwent endoscopy/colonoscopy 5-28 which was negative for GI mass. Plan is to proceed with CT IR biopsy of omental mass.  Patient is currently on heparin Gtt  For A fib.   Assessment & Plan:   Principal Problem:   Intractable right lower quadrant abdominal pain Active Problems:   CAD (coronary artery disease)   Stroke with cerebral ischemia (HCC)   Atrial fibrillation (HCC)   Pulmonary hypertension (HCC)  1-Complex Masses Right adnexa and left ovary enlargement concern for ovarian  neoplasm.  Intractable right lower Quadrant abdominal pain. Related to adnexal masses:  Pain controlled.  -CEA 10, elevated.  -CA 125 29 normal.  -US pelvis ordered.  -Appreciate Dr Gerarda Fraction evaluation she recommend GI consultation given bilateral adnexal masses (concern with metastatic diseases), Elevated CEA and black stool for GI work up prior any surgical resection.  -patient underwent  endoscopy, colonoscopy 5-28 by Dr Paulita Fujita.  -plan is for CT biopsy of omental mass by IR.  -Follow Dr Gerarda Fraction rec.   Anemia;  Continue to trend Hb.  Hb trending down , no evidence of GI bleed.  Repeat labs in am.   Right Hydronephrosis, with adnexal Masses;  Cr stable.  Dr Matilde Sprang consulted. ? Stent at time of Sx.  Dr. Tresa Moore; think patient might need local resection, ureteral implant if patient is deem surgical candidate for resection  by GYN.  For IR biopsy of omental mass   A fib RVR;  Chronic A fib; prior history of stroke.  On eliquis at home  Continue with heparin Gtt.  Rate controlled today. Rate was elevated morning of 5-27, responded to a dose of IV metoprolol/   History of CVA; on eliquis at home  Continue with heparin.    DVT prophylaxis: Heparin Gtt Code Status: full code.  Family Communication: son in law at bedside.  Disposition Plan: to be determine , work up for adnexal mass in progress.    Consultants:   GI  GYN  Urology      Procedures:   US pelvis/    Antimicrobials:   none   Subjective: Came from endoscopy. Denies worsening abdominal pain.     Objective:  Vitals:   03/07/18 1000 03/07/18 1045 03/07/18 1055 03/07/18 1328  BP: (!) 147/70 (!) 110/56 124/71 (!) 153/86  Pulse: 71 81 (!) 38 69  Resp:  (!) 24 (!) 25 16  Temp:  97.7 F (36.5 C)  (!) 97.3 F (36.3 C)  TempSrc:  Oral  Oral  SpO2:  95% 98% 93%  Weight:      Height:        Intake/Output Summary (Last 24 hours) at 03/07/2018 1501 Last data filed at 03/07/2018 1044 Gross per 24 hour   Intake 794.8 ml  Output 100 ml  Net 694.8 ml   Filed Weights   03/03/18 1106 03/07/18 0852  Weight: 57.2 kg (126 lb) 57.2 kg (126 lb)    Examination:  General exam: NAD Respiratory system: CTA Cardiovascular system: S 1, S 2 RRR Gastrointestinal system: BS present, soft, nt Central nervous system: Non focal.  Extremities: no edema.  Skin: no rash     Data Reviewed: I have personally reviewed following labs and imaging studies  CBC: Recent Labs  Lab 03/03/18 1118 03/04/18 0356 03/05/18 0309 03/06/18 0144 03/07/18 0435  WBC 10.2 9.9 12.4* 11.2* 9.4  HGB 12.2 11.6* 10.7* 10.4* 9.9*  HCT 39.5 37.4 34.9* 34.1* 32.3*  MCV 87.2 88.0 88.6 88.8 88.7  PLT 269 269 236 230 347   Basic Metabolic Panel: Recent Labs  Lab 03/03/18 1118 03/04/18 0356 03/06/18 0144  NA 139 138 139  K 3.5 4.0 4.2  CL 103 105 107  CO2 26 25 24   GLUCOSE 120* 113* 106*  BUN 21* 14 12  CREATININE 1.16* 1.19* 1.16*  CALCIUM 9.5 9.0 8.8*   GFR: Estimated Creatinine Clearance: 29.1 mL/min (A) (by C-G formula based on SCr of 1.16 mg/dL (H)). Liver Function Tests: Recent Labs  Lab 03/03/18 1118  AST 19  ALT 11*  ALKPHOS 55  BILITOT 0.7  PROT 6.9  ALBUMIN 3.8   Recent Labs  Lab 03/03/18 1118  LIPASE 31   No results for input(s): AMMONIA in the last 168 hours. Coagulation Profile: Recent Labs  Lab 03/04/18 0356  INR 1.06   Cardiac Enzymes: No results for input(s): CKTOTAL, CKMB, CKMBINDEX, TROPONINI in the last 168 hours. BNP (last 3 results) No results for input(s): PROBNP in the last 8760 hours. HbA1C: No results for input(s): HGBA1C in the last 72 hours. CBG: No results for input(s): GLUCAP in the last 168 hours. Lipid Profile: No results for input(s): CHOL, HDL, LDLCALC, TRIG, CHOLHDL, LDLDIRECT in the last 72 hours. Thyroid Function Tests: No results for input(s): TSH, T4TOTAL, FREET4, T3FREE, THYROIDAB in the last 72 hours. Anemia Panel: No results for input(s):  VITAMINB12, FOLATE, FERRITIN, TIBC, IRON, RETICCTPCT in the last 72 hours. Sepsis Labs: No results for input(s): PROCALCITON, LATICACIDVEN in the last 168 hours.  No results found for this or any previous visit (from the past 240 hour(s)).       Radiology Studies: No results found.      Scheduled Meds: . atorvastatin  10 mg Oral q1800  . metoprolol tartrate  75 mg Oral BID  . pantoprazole (PROTONIX) IV  40 mg Intravenous Q12H  . polyethylene glycol  17 g Oral Daily  . senna  1 tablet Oral Daily   Continuous Infusions: . sodium chloride Stopped (03/07/18 0722)  . heparin 800 Units/hr (03/07/18 1149)     LOS: 4 days    Time spent: 35 minutes.     Elmarie Shiley, MD [facility] Pager  [phone] If 7PM-7AM, please contact night-coverage [email or web address] Password [facility] 03/07/2018, 3:01 PM

## 2018-03-07 NOTE — Progress Notes (Signed)
ANTICOAGULATION CONSULT NOTE - Follow up [place] for Heparin Indication: atrial fibrillation - apixaban on hold  No Known Allergies  Patient Measurements: Height: 5' (152.4 cm) Weight: 126 lb (57.2 kg) IBW/kg (Calculated) : 45.5 Heparin Dosing Weight: 57  Vital Signs: Temp: 97.3 F (36.3 C) (05/28 1328) Temp Source: Oral (05/28 1328) BP: 153/86 (05/28 1328) Pulse Rate: 69 (05/28 1328)  Labs: Recent Labs    03/05/18 0309 03/06/18 0144 03/07/18 0435 03/07/18 1920  HGB 10.7* 10.4* 9.9*  --   HCT 34.9* 34.1* 32.3*  --   PLT 236 230 232  --   APTT 119* 88*  --   --   HEPARINUNFRC 1.08* 0.62  --  0.28*  CREATININE  --  1.16*  --   --    Estimated Creatinine Clearance: 29.1 mL/min (A) (by C-G formula based on SCr of 1.16 mg/dL (H)).   Assessment: 84yo female presents with c/o abdominal pain and CT results noted for mass. Patient on Eliquis 2.5mg  BID PTA for AFib- currently on IV heparin, last Eliquis dose on 5/23. Heparin resumed post colonoscopy today- heparin level low at 0.28units/mL. No overt bleeding noted. To have CT biopsy of omental mass by IR.  Goal of Therapy:  Heparin level 0.3-0.7 units/ml  Monitor platelets by anticoagulation protocol: Yes   Plan:  Increase heparin infusion to 850 units/hr Daily heparin level and CBC   Tionne Carelli D. [place], PharmD, [place] Clinical Pharmacist [phone] 03/07/2018 8:47 PM

## 2018-03-07 NOTE — Interval H&P Note (Signed)
History and Physical Interval Note:  03/07/2018 9:59 AM  [name] [name]  has presented today for surgery, with the diagnosis of melena  The various methods of treatment have been discussed with the patient and family. After consideration of risks, benefits and other options for treatment, the patient has consented to  Procedure(s): ESOPHAGOGASTRODUODENOSCOPY (EGD) WITH PROPOFOL (N/A) COLONOSCOPY WITH PROPOFOL (N/A) as a surgical intervention .  The patient's history has been reviewed, patient examined, no change in status, stable for surgery.  I have reviewed the patient's chart and labs.  Questions were answered to the patient's satisfaction.     Landry Dyke

## 2018-03-07 NOTE — Transfer of Care (Signed)
Immediate Anesthesia Transfer of Care Note  Patient: Jacqlyn Marolf  Procedure(s) Performed: ESOPHAGOGASTRODUODENOSCOPY (EGD) WITH PROPOFOL (N/A ) COLONOSCOPY WITH PROPOFOL (N/A )  Patient Location: Endoscopy Unit  Anesthesia Type:MAC  Level of Consciousness: drowsy and patient cooperative  Airway & Oxygen Therapy: Patient Spontanous Breathing and Patient connected to nasal cannula oxygen  Post-op Assessment: Report given to RN, Post -op Vital signs reviewed and stable and Patient moving all extremities X 4  Post vital signs: Reviewed and stable  Last Vitals:  Vitals Value Taken Time  BP    Temp    Pulse 89 03/07/2018 10:43 AM  Resp 12 03/07/2018 10:43 AM  SpO2 95 % 03/07/2018 10:43 AM  Vitals shown include unvalidated device data.  Last Pain:  Vitals:   03/07/18 0852  TempSrc: Oral  PainSc: 0-No pain      Patients Stated Pain Goal: 0 (86/85/48 8301)  Complications: No apparent anesthesia complications

## 2018-03-07 NOTE — Op Note (Signed)
[facility] Patient Name: [name] [name] Procedure Date : 03/07/2018 MRN: [id number] Attending MD: Arta Silence , MD Date of Birth: 22-Mar-1932 CSN: [id number] Age: 84 Admit Type: Inpatient Procedure:                Colonoscopy Indications:              Generalized abdominal pain, Melena, Abnormal CT of                            the GI tract Providers:                Arta Silence, MD, Presley Raddle, RN, Estle Huguley Dalton, Technician Referring MD:              Medicines:                Monitored Anesthesia Care Complications:            No immediate complications. Estimated Blood Loss:     Estimated blood loss: none. Procedure:                Pre-Anesthesia Assessment:                           - Prior to the procedure, a History and Physical                            was performed, and patient medications and                            allergies were reviewed. The patient's tolerance of                            previous anesthesia was also reviewed. The risks                            and benefits of the procedure and the sedation                            options and risks were discussed with the patient.                            All questions were answered, and informed consent                            was obtained. Prior Anticoagulants: The patient has                            taken heparin, last dose was 4 hours pre-procedure.                            ASA Grade Assessment: III - A patient with severe  systemic disease. After reviewing the risks and                            benefits, the patient was deemed in satisfactory                            condition to undergo the procedure.                           After obtaining informed consent, the colonoscope                            was passed under direct vision. Throughout the                            procedure, the patient's blood  pressure, pulse, and                            oxygen saturations were monitored continuously. The                            EC-[id number] ([id number]) scope was introduced through                            the anus and advanced to the the cecum, identified                            by appendiceal orifice and ileocecal valve. The                            colonoscopy was performed without difficulty. The                            patient tolerated the procedure well. The quality                            of the bowel preparation was fair. The ileocecal                            valve and the rectum were photographed. Scope In: 10:22:06 AM Scope Out: 10:38:46 AM Scope Withdrawal Time: 0 hours 6 minutes 33 seconds  Total Procedure Duration: 0 hours 16 minutes 40 seconds  Findings:      Hemorrhoids were found on perianal exam.      Internal hemorrhoids were found during retroflexion. The hemorrhoids       were Grade III (internal hemorrhoids that prolapse but require manual       reduction).      No additional abnormalities were found on retroflexion.      Many small and large-mouthed diverticula were found in the sigmoid       colon, descending colon, transverse colon and ascending colon.      Bowel prep diffusely fair; semisolid and viscous stool obscured some       views throughout colon; diminutive or subtle sessile polyps could easily       have  been missed. The cecum, in particular, had large amount of solid       stool, couldn't lavage, couldn't identify appendiceal orifice.      Couple areas of possible extrinsic compression in proximal colon;       however, no intraluminal colonic pathology identified. Colon otherwise       normal; no other polyps, masses, vascular ectasias, or inflammatory       changes were seen. Impression:               - Preparation of the colon was fair.                           - Hemorrhoids found on perianal exam.                           - Internal  hemorrhoids.                           - Diverticulosis in the sigmoid colon, in the                            descending colon, in the transverse colon and in                            the ascending colon. Moderate Sedation:      None Recommendation:           - Return patient to hospital ward for ongoing care.                           - Clear liquid diet today.                           - Continue present medications.                           - OK to resume anticoagulation from GI perspective.                           - No further GI work-up anticipated; IR-guided                            biopsies of adnexal lesions might be next best                            prudent step in management.                           [facility] will sign-off; please call with any                            questions; thank you for the consultation. Procedure Code(s):        --- Professional ---                           [phone], Colonoscopy, flexible; diagnostic, including  collection of specimen(s) by brushing or washing,                            when performed (separate procedure) Diagnosis Code(s):        --- Professional ---                           K64.2, Third degree hemorrhoids                           R10.84, Generalized abdominal pain                           K92.1, Melena (includes Hematochezia)                           K57.30, Diverticulosis of large intestine without                            perforation or abscess without bleeding                           R93.3, Abnormal findings on diagnostic imaging of                            other parts of digestive tract CPT copyright 2017 [employer]. All rights reserved. The codes documented in this report are preliminary and upon coder review may  be revised to meet current compliance requirements. Arta Silence, MD 03/07/2018 10:50:43 AM This report has been signed electronically. Number  of Addenda: 0

## 2018-03-07 NOTE — Progress Notes (Signed)
Chief Complaint: Patient was seen in consultation today for biopsy of intra-abdominal nodule/mass at the request of Dr. Precious Haws  Referring Physician(s): Dr. Precious Haws  Supervising Physician: Corrie Mckusick  Patient Status: [facility] - In-pt  History of Present Illness: [name] [name] is a 84 y.o. female admitted on 5/24 with RLQ pain. Her workup finds right adnexal/pelvic mass and omental nodules. She underwent endoscopy which was negative for GI source of the mass. IR is asked to perform image guided biopsy. PMHx reviewed, pertinent for Afib, normally on Eliquis, this has been held and pt now on IV heparin drip. PMHx, meds, labs, imaging, allergies reviewed. Feels well, no recent fevers, chills, illness.. Family at bedside.   Past Medical History:  Diagnosis Date  . CAD (coronary artery disease)    multivessel  . H/O atrial fibrillation   . H/O: CVA (cerebrovascular accident)    presented 60 with slurred speech. No residual deficit  . Hiatal hernia   . Hypercholesterolemia   . Hypertension   . Iron deficiency   . Osteopenia   . Spasmodic dysphonia   . Vitamin B12 deficiency     Past Surgical History:  Procedure Laterality Date  . CORONARY ARTERY BYPASS GRAFT     x 5  . left breast biopsy    . spasmodic dysphonia    . VAGINAL HYSTERECTOMY     for prolapse    Allergies: Patient has no known allergies.  Medications:  Current Facility-Administered Medications:  .  0.9 %  sodium chloride infusion, , Intravenous, Continuous, Arta Silence, MD, Stopped at 03/07/18 [phone] .  acetaminophen (TYLENOL) tablet 650 mg, 650 mg, Oral, Q6H PRN, 650 mg at 03/06/18 2122 **OR** acetaminophen (TYLENOL) suppository 650 mg, 650 mg, Rectal, Q6H PRN, Arta Silence, MD .  albuterol (PROVENTIL) (2.5 MG/3ML) 0.083% nebulizer solution 2.5 mg, 2.5 mg, Nebulization, Q2H PRN, Arta Silence, MD .  atorvastatin (LIPITOR) tablet 10 mg, 10 mg, Oral, q1800, Arta Silence, MD, 10  mg at 03/06/18 1729 .  heparin ADULT infusion 100 units/mL (25000 units/214mL sodium chloride 0.45%), 800 Units/hr, Intravenous, Continuous, Regalado, Belkys A, MD, Last Rate: 8 mL/hr at 03/07/18 1149, 800 Units/hr at 03/07/18 1149 .  metoprolol tartrate (LOPRESSOR) injection 2.5 mg, 2.5 mg, Intravenous, Q6H PRN, Arta Silence, MD .  metoprolol tartrate (LOPRESSOR) tablet 75 mg, 75 mg, Oral, BID, Arta Silence, MD, 75 mg at 03/07/18 1123 .  morphine 2 MG/ML injection 1 mg, 1 mg, Intravenous, Q4H PRN, Arta Silence, MD, 1 mg at 03/05/18 2318 .  ondansetron (ZOFRAN) tablet 4 mg, 4 mg, Oral, Q6H PRN **OR** ondansetron (ZOFRAN) injection 4 mg, 4 mg, Intravenous, Q6H PRN, Arta Silence, MD, 4 mg at 03/05/18 0912 .  oxyCODONE-acetaminophen (PERCOCET/ROXICET) 5-325 MG per tablet 1-2 tablet, 1-2 tablet, Oral, Q6H PRN, Arta Silence, MD, 2 tablet at 03/06/18 2300 .  pantoprazole (PROTONIX) injection 40 mg, 40 mg, Intravenous, Q12H, Arta Silence, MD, 40 mg at 03/07/18 1126 .  polyethylene glycol (MIRALAX / GLYCOLAX) packet 17 g, 17 g, Oral, Daily, Arta Silence, MD, 17 g at 03/06/18 0955 .  senna (SENOKOT) tablet 8.6 mg, 1 tablet, Oral, Daily, Arta Silence, MD, 8.6 mg at 03/06/18 3007    Family History  Problem Relation Age of Onset  . Stroke Father   . Heart attack Mother     Social History   Socioeconomic History  . Marital status: Married    Spouse name: Not on file  . Number of children: 1  . Years of  education: 12th  . Highest education level: Not on file  Occupational History  . Occupation: retired    Comment: Landscape architect: RETIRED  Social Needs  . Financial resource strain: Not on file  . Food insecurity:    Worry: Not on file    Inability: Not on file  . Transportation needs:    Medical: Not on file    Non-medical: Not on file  Tobacco Use  . Smoking status: Former Research scientist (life sciences)  . Smokeless tobacco: Never Used  Substance and Sexual Activity  .  Alcohol use: No    Alcohol/week: 0.0 oz  . Drug use: No  . Sexual activity: Yes  Lifestyle  . Physical activity:    Days per week: Not on file    Minutes per session: Not on file  . Stress: Not on file  Relationships  . Social connections:    Talks on phone: Not on file    Gets together: Not on file    Attends religious service: Not on file    Active member of club or organization: Not on file    Attends meetings of clubs or organizations: Not on file    Relationship status: Not on file  Other Topics Concern  . Not on file  Social History Narrative   Patient is married with one child.   Patient is right handed.   Patient has hs education.   Patient drinks 2 cups daily.     Review of Systems: A 12 point ROS discussed and pertinent positives are indicated in the HPI above.  All other systems are negative.  Review of Systems  Vital Signs: BP (!) 153/86 (BP Location: Right Arm)   Pulse 69   Temp (!) 97.3 F (36.3 C) (Oral)   Resp 16   Ht 5' (1.524 m)   Wt 126 lb (57.2 kg)   SpO2 93%   BMI 24.61 kg/m   Physical Exam  Constitutional: She is oriented to person, place, and time. She appears well-developed. No distress.  HENT:  Head: Normocephalic.  Mouth/Throat: Oropharynx is clear and moist.  Neck: Normal range of motion. No JVD present.  Cardiovascular: Normal heart sounds.  irreg irreg  Pulmonary/Chest: Effort normal and breath sounds normal. No respiratory distress.  Abdominal: Soft. She exhibits no mass. There is no tenderness.  Neurological: She is alert and oriented to person, place, and time.  Skin: Skin is warm and dry.    Imaging: US Pelvis Transvanginal Non-ob (tv Only)  Result Date: 03/04/2018 CLINICAL DATA:  Right-sided ovarian mass on CT. EXAM: TRANSABDOMINAL AND TRANSVAGINAL ULTRASOUND OF PELVIS TECHNIQUE: Both transabdominal and transvaginal ultrasound examinations of the pelvis were performed. Transabdominal technique was performed for global  imaging of the pelvis including uterus, ovaries, adnexal regions, and pelvic cul-de-sac. It was necessary to proceed with endovaginal exam following the transabdominal exam to visualize the ovary/adnexa. COMPARISON:  CT of 1 day prior FINDINGS: Uterus Surgically absent Right ovary Measurements: 3.9 x 2.2 x 3.7 cm. Partially calcified right ovarian lesion of 2.3 x 1.9 x 1.4 cm, including on image 37. Just inferior to this, a partially cystic, partially solid hypoechoic lesion measures 1.6 x 1.2 x 0.9 cm. Left ovary Measurements: 4.9 x 4.4 x 4.2 cm. Primarily solid with central cystic component lesion measures 2.5 x 2.3 x 1.6 cm. Other findings Trace free pelvic fluid. There is also fluid adjacent the left ovary. IMPRESSION: 1. Bilateral ovarian lesions, including a calcified right-sided lesion and a dominant  solid left-sided lesion. Suspicious for benign or malignant ovarian neoplasm(s). Consider gynecological consultation. If the patient is a surgical candidate, pre and postcontrast MRI may be informative. 2. Small volume cul-de-sac and left adnexal fluid. Electronically Signed   By: Abigail Miyamoto M.D.   On: 03/04/2018 15:21   US Pelvis (transabdominal Only)  Result Date: 03/04/2018 CLINICAL DATA:  Right-sided ovarian mass on CT. EXAM: TRANSABDOMINAL AND TRANSVAGINAL ULTRASOUND OF PELVIS TECHNIQUE: Both transabdominal and transvaginal ultrasound examinations of the pelvis were performed. Transabdominal technique was performed for global imaging of the pelvis including uterus, ovaries, adnexal regions, and pelvic cul-de-sac. It was necessary to proceed with endovaginal exam following the transabdominal exam to visualize the ovary/adnexa. COMPARISON:  CT of 1 day prior FINDINGS: Uterus Surgically absent Right ovary Measurements: 3.9 x 2.2 x 3.7 cm. Partially calcified right ovarian lesion of 2.3 x 1.9 x 1.4 cm, including on image 37. Just inferior to this, a partially cystic, partially solid hypoechoic lesion  measures 1.6 x 1.2 x 0.9 cm. Left ovary Measurements: 4.9 x 4.4 x 4.2 cm. Primarily solid with central cystic component lesion measures 2.5 x 2.3 x 1.6 cm. Other findings Trace free pelvic fluid. There is also fluid adjacent the left ovary. IMPRESSION: 1. Bilateral ovarian lesions, including a calcified right-sided lesion and a dominant solid left-sided lesion. Suspicious for benign or malignant ovarian neoplasm(s). Consider gynecological consultation. If the patient is a surgical candidate, pre and postcontrast MRI may be informative. 2. Small volume cul-de-sac and left adnexal fluid. Electronically Signed   By: Abigail Miyamoto M.D.   On: 03/04/2018 15:21   Ct Abdomen Pelvis W Contrast  Result Date: 03/03/2018 CLINICAL DATA:  Abdominal pain, primarily on the right EXAM: CT ABDOMEN AND PELVIS WITH CONTRAST TECHNIQUE: Multidetector CT imaging of the abdomen and pelvis was performed using the standard protocol following bolus administration of intravenous contrast. Oral contrast was also administered. CONTRAST:  67mL OMNIPAQUE IOHEXOL 300 MG/ML  SOLN COMPARISON:  Abdominal ultrasound August 19, 2014 FINDINGS: Lower chest: There is bibasilar atelectasis. There are foci of coronary artery calcification. There is a sizable hiatal type hernia. Hepatobiliary: No focal liver lesions are evident on this noncontrast enhanced study. Gallbladder wall is not appreciably thickened. There is no biliary duct dilatation. Pancreas: There is no well-defined mass seen in the pancreas. No inflammatory focus evident. There are benign-appearing calcifications in the body of the pancreas which may represent residua of prior pancreatitis. No pancreatic duct dilatation evident. Spleen: No splenic lesions evident. Adrenals/Urinary Tract: Adrenals bilaterally appear normal. There is no renal mass on either side. There is decreased enhancement of the right kidney compared to the left kidney. There is severe hydronephrosis on the right. No  hydronephrosis is evident on the left. There is no renal or ureteral calculus on either side. There is diffuse dilatation of the left ureter to its distal aspect. In the region of the distal left ureter, there is wall thickening with what appears to be a right adnexal mass either impressing upon or frankly invading the right distal ureter. Urinary bladder is midline with wall thickness within normal limits. Stomach/Bowel: There is no appreciable bowel wall or mesenteric thickening. No evident bowel obstruction. No free air or portal venous air. Vascular/Lymphatic: There is atherosclerotic calcification in the aorta and common iliac arteries. There is also common femoral artery atherosclerosis bilaterally. No aneurysm evident. Major mesenteric arterial vessels appear patent. There is no appreciable adenopathy in the abdomen or pelvis. Reproductive: Uterus absent. There is a  complex mass in the right adnexal region containing calcification. This mass measures 5.0 x 4.9 x 4.2 cm. There is enlargement of the left ovary with mixed attenuation measuring 4.8 x 4.2 x 3.9 cm. Other: There is a mass in the right adnexal region immediately adjacent to the larger mass arising from the right ovary measuring 2.5 x 2.4 x 2.4 cm. This mass may well represent an omental lesion immediately adjacent to the right ovary which is either invading or impressing upon the distal right ureter. No similar lesions elsewhere. No abscess or ascites is present in the abdomen or pelvis. Appendix not seen. It is conceivable that the mass in the right adnexal region represents an appendiceal neoplasm as opposed to an ovarian neoplasm. Musculoskeletal: There are pars defects bilaterally at L5 with 7 mm of anterolisthesis of L5 on S1. There is degenerative change in the lumbar spine. There are no blastic or lytic bone lesions. There is no intramuscular or abdominal wall lesion. IMPRESSION: 1. There are complex masses in the right adnexal region. One of  these masses either extends into or compresses the distal right ureter. The larger of these right adnexal masses measures 5.0 x 4.9 x 4.2 cm. There is an adjacent mass measuring 2.5 x 2.4 x 2.4 cm. The appearance is concerning for ovarian neoplastic lesions on the right, possibly with an immediately adjacent omental lesion impressing upon or invading the right ureter. It should be noted that a normal appendix is not seen. A neoplastic lesion involving the appendix located in the right adnexal region must be considered a differential consideration. There is no inflammation in these areas. 2. Enlarged left ovary with inhomogeneous appearance. Concern for left ovarian neoplasm also present. 3. Severe hydronephrosis and ureterectasis on the right with decreased enhancement right kidney compared to the left consistent with the obstructing focus in the distal ureter region on the right. 4. No bowel obstruction. No abscess. No renal or ureteral calculi. No hydronephrosis. 5.  No adenopathy or liver lesions evident. 6. Aortoiliac atherosclerosis. Femoral artery atherosclerosis. There is coronary artery calcification as well. 7.  Pars defects at L5 bilaterally with spondylolisthesis at L5-S1. 8.  Uterus absent. 9.  Sizable hiatal type hernia. Comment: Pelvic MR nonemergently may be helpful for further delineation of the lesions in the right adnexal region. Aortic Atherosclerosis (ICD10-I70.0). Electronically Signed   By: Lowella Grip III M.D.   On: 03/03/2018 14:25    Labs:  CBC: Recent Labs    03/04/18 0356 03/05/18 0309 03/06/18 0144 03/07/18 0435  WBC 9.9 12.4* 11.2* 9.4  HGB 11.6* 10.7* 10.4* 9.9*  HCT 37.4 34.9* 34.1* 32.3*  PLT 269 236 230 232    COAGS: Recent Labs    03/03/18 2345 03/04/18 0356 03/04/18 1205 03/05/18 0309 03/06/18 0144  INR  --  1.06  --   --   --   APTT 52*  --  93* 119* 88*    BMP: Recent Labs    03/03/18 1118 03/04/18 0356 03/06/18 0144  NA 139 138 139  K  3.5 4.0 4.2  CL 103 105 107  CO2 26 25 24   GLUCOSE 120* 113* 106*  BUN 21* 14 12  CALCIUM 9.5 9.0 8.8*  CREATININE 1.16* 1.19* 1.16*  GFRNONAA 42* 41* 42*  GFRAA 49* 48* 49*    LIVER FUNCTION TESTS: Recent Labs    03/03/18 1118  BILITOT 0.7  AST 19  ALT 11*  ALKPHOS 55  PROT 6.9  ALBUMIN 3.8  TUMOR MARKERS: No results for input(s): AFPTM, CEA, CA199, CHROMGRNA in the last 8760 hours.  Assessment and Plan: (R)adnexal/pelvic mass with multiple omental/peritoneal nodules. Imaging reviewed with Dr. Earleen Newport, omental/peritoneal nodules amenable to image guided perc biopsy Will make NPO p MN for procedure tomorrow. Risks and benefits discussed with the patient including, but not limited to bleeding, infection, damage to adjacent structures or low yield requiring additional tests.  All of the patient's questions were answered, patient is agreeable to proceed. Consent signed and in chart.  Heparin will need to be held for several hours prior to biopsy. IR team will call RN to communicate when.  Thank you for this interesting consult.  I greatly enjoyed meeting Eliot Bencivenga and look forward to participating in their care.  A copy of this report was sent to the requesting provider on this date.  Electronically Signed: Ascencion Dike, PA-C 03/07/2018, 4:34 PM   I spent a total of 20 minutes in face to face in clinical consultation, greater than 50% of which was counseling/coordinating care for biopsy

## 2018-03-07 NOTE — Progress Notes (Signed)
ANTICOAGULATION CONSULT NOTE - Follow up [place] for Heparin Indication: atrial fibrillation - apixaban on hold  No Known Allergies  Patient Measurements: Height: 5' (152.4 cm) Weight: 126 lb (57.2 kg) IBW/kg (Calculated) : 45.5 Heparin Dosing Weight: 57  Vital Signs: Temp: 97.7 F (36.5 C) (05/28 1045) Temp Source: Oral (05/28 1045) BP: 124/71 (05/28 1055) Pulse Rate: 38 (05/28 1055)  Labs: Recent Labs    03/04/18 1205  03/05/18 0309 03/06/18 0144 03/07/18 0435  HGB  --    < > 10.7* 10.4* 9.9*  HCT  --   --  34.9* 34.1* 32.3*  PLT  --   --  236 230 232  APTT 93*  --  119* 88*  --   HEPARINUNFRC  --   --  1.08* 0.62  --   CREATININE  --   --   --  1.16*  --    < > = values in this interval not displayed.   Estimated Creatinine Clearance: 29.1 mL/min (A) (by C-G formula based on SCr of 1.16 mg/dL (H)).   Assessment: 84yo female presents with c/o abdominal pain and CT results noted for mass.  Pharmacy consulted for heparin dosing. She is on oral anticoagulation with apixaban prior to admit at renal dose adjusted 2.5mg  bid.  CBC low and trending down, and intermittent melena, with no signs/sx of GI bleed per GI team. She has some age related renal decline with an estimated crcl ~ 61ml/min.   Last apixaban dose on 5/24.  Heparin resumed post colonoscopy today  Goal of Therapy:  Heparin level 0.3-0.7 units/ml  Monitor platelets by anticoagulation protocol: Yes   Plan:  Heparin infusion at 800 units/hr 8 hour heparin level Continue to monitor H&H and platelets  Thank you Anette Guarneri, PharmD [phone] 03/07/2018 11:33 AM

## 2018-03-07 NOTE — Op Note (Signed)
[facility] Patient Name: [name] [name] Procedure Date : 03/07/2018 MRN: [id number] Attending MD: Arta Silence , MD Date of Birth: [birth year] CSN: [id number] Age: 84 Admit Type: Inpatient Procedure:                Upper GI endoscopy Indications:              Generalized abdominal pain, Melena, Abnormal CT of                            the GI tract Providers:                Arta Silence, MD, Presley Raddle, RN, Malita Ignasiak Dalton, Technician Referring MD:              Medicines:                Monitored Anesthesia Care Complications:            No immediate complications. Estimated Blood Loss:     Estimated blood loss: none. Procedure:                Pre-Anesthesia Assessment:                           - Prior to the procedure, a History and Physical                            was performed, and patient medications and                            allergies were reviewed. The patient's tolerance of                            previous anesthesia was also reviewed. The risks                            and benefits of the procedure and the sedation                            options and risks were discussed with the patient.                            All questions were answered, and informed consent                            was obtained. Prior Anticoagulants: The patient has                            taken heparin, last dose was 4 hours pre-procedure.                            ASA Grade Assessment: III - A patient with severe  systemic disease. After reviewing the risks and                            benefits, the patient was deemed in satisfactory                            condition to undergo the procedure.                           After obtaining informed consent, the endoscope was                            passed under direct vision. Throughout the                            procedure, the patient's blood  pressure, pulse, and                            oxygen saturations were monitored continuously. The                            EG-[id number] ([id number]) scope was introduced through the                            mouth, and advanced to the second part of duodenum.                            The upper GI endoscopy was accomplished without                            difficulty. The patient tolerated the procedure                            well. Scope In: Scope Out: Findings:      A large hiatal hernia was present.      The exam of the esophagus was otherwise normal.      The entire examined stomach was normal.      The duodenal bulb, first portion of the duodenum and second portion of       the duodenum were normal. Impression:               - Large hiatal hernia.                           - Normal stomach.                           - Normal duodenal bulb, first portion of the                            duodenum and second portion of the duodenum.                           - No source of abdominal pain or GI bleeding seen  on endoscopy. Moderate Sedation:      None Recommendation:           - Perform a colonoscopy today. Procedure Code(s):        --- Professional ---                           [id number], Esophagogastroduodenoscopy, flexible,                            transoral; diagnostic, including collection of                            specimen(s) by brushing or washing, when performed                            (separate procedure) Diagnosis Code(s):        --- Professional ---                           K44.9, Diaphragmatic hernia without obstruction or                            gangrene                           R10.84, Generalized abdominal pain                           K92.1, Melena (includes Hematochezia)                           R93.3, Abnormal findings on diagnostic imaging of                            other parts of digestive tract CPT copyright 2017  American Medical Association. All rights reserved. The codes documented in this report are preliminary and upon coder review may  be revised to meet current compliance requirements. Arta Silence, MD 03/07/2018 10:46:18 AM This report has been signed electronically. Number of Addenda: 0

## 2018-03-08 ENCOUNTER — Inpatient Hospital Stay (HOSPITAL_COMMUNITY): Payer: Medicare Other

## 2018-03-08 DIAGNOSIS — I272 Pulmonary hypertension, unspecified: Secondary | ICD-10-CM

## 2018-03-08 DIAGNOSIS — N839 Noninflammatory disorder of ovary, fallopian tube and broad ligament, unspecified: Secondary | ICD-10-CM

## 2018-03-08 DIAGNOSIS — N949 Unspecified condition associated with female genital organs and menstrual cycle: Secondary | ICD-10-CM

## 2018-03-08 DIAGNOSIS — R1031 Right lower quadrant pain: Secondary | ICD-10-CM

## 2018-03-08 DIAGNOSIS — I639 Cerebral infarction, unspecified: Secondary | ICD-10-CM

## 2018-03-08 LAB — BASIC METABOLIC PANEL
Anion gap: 7 (ref 5–15)
BUN: 9 mg/dL (ref 6–20)
CHLORIDE: 104 mmol/L (ref 101–111)
CO2: 29 mmol/L (ref 22–32)
Calcium: 8.9 mg/dL (ref 8.9–10.3)
Creatinine, Ser: 1.1 mg/dL — ABNORMAL HIGH (ref 0.44–1.00)
GFR calc Af Amer: 52 mL/min — ABNORMAL LOW (ref >60)
GFR calc non Af Amer: 45 mL/min — ABNORMAL LOW (ref >60)
GLUCOSE: 94 mg/dL (ref 65–99)
POTASSIUM: 3.1 mmol/L — AB (ref 3.5–5.1)
SODIUM: 140 mmol/L (ref 135–145)

## 2018-03-08 LAB — CBC
HEMATOCRIT: 33.9 % — AB (ref 36.0–46.0)
HEMOGLOBIN: 10.5 g/dL — AB (ref 12.0–15.0)
MCH: 27.1 pg (ref 26.0–34.0)
MCHC: 31 g/dL (ref 30.0–36.0)
MCV: 87.6 fL (ref 78.0–100.0)
Platelets: 260 10*3/uL (ref 150–400)
RBC: 3.87 MIL/uL (ref 3.87–5.11)
RDW: 13.5 % (ref 11.5–15.5)
WBC: 8.4 10*3/uL (ref 4.0–10.5)

## 2018-03-08 LAB — HEPARIN LEVEL (UNFRACTIONATED): Heparin Unfractionated: 0.45 IU/mL (ref 0.30–0.70)

## 2018-03-08 MED ORDER — FENTANYL CITRATE (PF) 100 MCG/2ML IJ SOLN
INTRAMUSCULAR | Status: AC | PRN
  Administered 2018-03-08: 50 ug via INTRAVENOUS

## 2018-03-08 MED ORDER — HEPARIN (PORCINE) IN NACL 100-0.45 UNIT/ML-% IJ SOLN
850.0000 [IU]/h | INTRAMUSCULAR | Status: DC
  Administered 2018-03-08: 850 [IU]/h via INTRAVENOUS
  Filled 2018-03-08: qty 250

## 2018-03-08 MED ORDER — SODIUM CHLORIDE 0.9 % IV SOLN
INTRAVENOUS | Status: AC | PRN
  Administered 2018-03-08: 10 mL/h via INTRAVENOUS

## 2018-03-08 MED ORDER — FENTANYL CITRATE (PF) 100 MCG/2ML IJ SOLN
INTRAMUSCULAR | Status: AC
  Filled 2018-03-08: qty 2

## 2018-03-08 MED ORDER — MIDAZOLAM HCL 2 MG/2ML IJ SOLN
INTRAMUSCULAR | Status: AC | PRN
  Administered 2018-03-08: 1 mg via INTRAVENOUS

## 2018-03-08 MED ORDER — MIDAZOLAM HCL 2 MG/2ML IJ SOLN
INTRAMUSCULAR | Status: AC
  Filled 2018-03-08: qty 2

## 2018-03-08 NOTE — Progress Notes (Signed)
PROGRESS NOTE    Lassie Demorest  HWE:[id number] DOB: 02-May-1932 DOA: 03/03/2018 PCP: Jani Gravel, MD   Brief Narrative by Dr Sloan Leiter: Beulah Capobianco is a 84 y.o. female with medical history significant of CAD status post remote CABG, atrial fibrillation on Eliquis, CVA without any residual deficits-presenting to the ED for evaluation of the above-noted complaints.  Per patient, approximately 2 weeks back-she started noticing pain in the right lower quadrant.  Pain initially resolved to over-the-counter medications-Tylenol/Motrin.  However pain gradually increased over the past few days.  Pain is dull, around 10/10 at its worse (per family) and without any radiation.  No particular aggravating factors, somewhat relieved by tramadol.  Patient does acknowledge some frequency of urination and dysuria.  Does not have any flank or back pain.  Patient went to her primary care doctor a few days back due to worsening right lower quadrant pain and was provided tramadol.  Unfortunately the pain continued to worsen, as a result the patient presented to the ED for further evaluation and treatment. Associated with nausea, and one episode of vomiting earlier this morning.  Patient was found to have bilateral adnexal masses and right side hydronephrosis from ureter obstruction.  GYN-oncology Dr Gerarda Fraction and urology was consulted. Dr Gerarda Fraction was recommending GI work up with endoscopy, colonoscopy to rule out GI malignancy. She underwent endoscopy/colonoscopy 5-28 which was negative for GI mass. Plan is to proceed with CT IR biopsy of omental mass.  Patient is currently on heparin Gtt  For A fib.   Assessment & Plan:   Complex Masses Right adnexa and left ovary enlargement concern for ovarian neoplasm.  Intractable right lower Quadrant abdominal pain. Related to adnexal masses:  Pain controlled, on current regimen of morphine and Percocet when necessary -CEA 10, elevated.  -Gyn Dr Gerarda Fraction following, she recommended  GI evaluation -this was completed with EGD and colonoscopy on 5/28 by Dr. Fayette Pho was unremarkable -IR consulted, for omental nodule biopsy today -resume Eliquis tomorrow  Anemia;  -hemoglobin stable, no evidence of active bleeding  Right Hydronephrosis, with adnexal Masses;  -creatinine normalized -urology consulted, Dr. Tresa Moore; thinks patient might need local resection, ureteral implant if patient is deem surgical candidate for resection  by GYN.  -await biopsy results  A fib RVR;  Chronic A fib; prior history of stroke.  On eliquis at home, now held, being bridged with heparin for biopsy Rate controlled   History of CVA; on eliquis at home  -on heparin bridge   DVT prophylaxis: Heparin Gtt Code Status: full code.  Family Communication: daughter and son in law at bedside.  Disposition Plan: to be determined , work up for adnexal mass in progress.    Consultants:   GI  GYN  Urology      Procedures:   US pelvis/    Antimicrobials:   none   Subjective: - feels okay, hungry awaiting liver biopsy  Objective: Vitals:   03/08/18 1410 03/08/18 1415 03/08/18 1458 03/08/18 1501  BP: (!) 171/82 (!) 177/92 (!) 166/109 (!) 177/91  Pulse: (!) 123 95 89   Resp:      Temp:      TempSrc:      SpO2: (!) 84% 92% 93% 92%  Weight:      Height:        Intake/Output Summary (Last 24 hours) at 03/08/2018 1516 Last data filed at 03/07/2018 1859 Gross per 24 hour  Intake 61 ml  Output -  Net 61 ml   [employer]  03/03/18 1106 03/07/18 0852  Weight: 57.2 kg (126 lb) 57.2 kg (126 lb)    Examination:  Gen: Awake, Alert, Oriented X 3, elderly, chronically ill-appearing HEENT: PERRLA, Neck supple, no JVD Lungs: Good air movement bilaterally, CTAB CVS: RRR,No Gallops,Rubs or new Murmurs Abd: soft, Non tender, non distended, BS present Extremities: No edema Skin: no new rashes   Data Reviewed: I have personally reviewed following labs and imaging  studies  CBC: Recent Labs  Lab 03/04/18 0356 03/05/18 0309 03/06/18 0144 03/07/18 0435 2018-03-15 0426  WBC 9.9 12.4* 11.2* 9.4 8.4  HGB 11.6* 10.7* 10.4* 9.9* 10.5*  HCT 37.4 34.9* 34.1* 32.3* 33.9*  MCV 88.0 88.6 88.8 88.7 87.6  PLT 269 236 230 232 034   Basic Metabolic Panel: Recent Labs  Lab 03/03/18 1118 03/04/18 0356 03/06/18 0144 03/15/18 0426  NA 139 138 139 140  K 3.5 4.0 4.2 3.1*  CL 103 105 107 104  CO2 26 25 24 29   GLUCOSE 120* 113* 106* 94  BUN 21* 14 12 9   CREATININE 1.16* 1.19* 1.16* 1.10*  CALCIUM 9.5 9.0 8.8* 8.9   GFR: Estimated Creatinine Clearance: 30.7 mL/min (A) (by C-G formula based on SCr of 1.1 mg/dL (H)). Liver Function Tests: Recent Labs  Lab 03/03/18 1118  AST 19  ALT 11*  ALKPHOS 55  BILITOT 0.7  PROT 6.9  ALBUMIN 3.8   Recent Labs  Lab 03/03/18 1118  LIPASE 31   No results for input(s): AMMONIA in the last 168 hours. Coagulation Profile: Recent Labs  Lab 03/04/18 0356  INR 1.06   Cardiac Enzymes: No results for input(s): CKTOTAL, CKMB, CKMBINDEX, TROPONINI in the last 168 hours. BNP (last 3 results) No results for input(s): PROBNP in the last 8760 hours. HbA1C: No results for input(s): HGBA1C in the last 72 hours. CBG: No results for input(s): GLUCAP in the last 168 hours. Lipid Profile: No results for input(s): CHOL, HDL, LDLCALC, TRIG, CHOLHDL, LDLDIRECT in the last 72 hours. Thyroid Function Tests: No results for input(s): TSH, T4TOTAL, FREET4, T3FREE, THYROIDAB in the last 72 hours. Anemia Panel: No results for input(s): VITAMINB12, FOLATE, FERRITIN, TIBC, IRON, RETICCTPCT in the last 72 hours. Sepsis Labs: No results for input(s): PROCALCITON, LATICACIDVEN in the last 168 hours.  No results found for this or any previous visit (from the past 240 hour(s)).       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 She presents for CT-guided biopsy of 1 of the omental nodules in the left upper quadrant. EXAM: CT BIOPSY MEDICATIONS: None. ANESTHESIA/SEDATION: Moderate (conscious) sedation was employed during this procedure. A total of Versed 1 mg and Fentanyl 50 mcg was administered intravenously. Moderate Sedation Time: 11 minutes. The patient's level of consciousness and vital signs were monitored continuously by radiology nursing throughout the procedure under my direct supervision. FLUOROSCOPY TIME:  Fluoroscopy Time: 0 minutes 0 seconds (0 mGy). COMPLICATIONS: None immediate. PROCEDURE: Informed written consent was obtained from the patient after a thorough discussion of the procedural risks, benefits and alternatives. All questions were addressed. Maximal Sterile Barrier Technique was utilized including caps, mask, sterile gowns, sterile gloves, sterile drape, hand hygiene and skin antiseptic. A timeout was performed prior to the initiation of the procedure. A planning axial CT scan was performed. Omental nodularity in the left upper quadrant was successfully identified. A suitable skin entry site was selected and marked. Following sterile  prep and drape in the standard fashion with chlorhexidine skin prep, local anesthesia was attained by infiltration with 1% lidocaine. A small dermatotomy was made. Under intermittent CT guidance, a 17 gauge introducer needle was advanced into the margin of the omental nodule. Multiple 18 gauge core biopsies were then obtained. Biopsy specimens were placed in formalin and delivered to pathology for further analysis. Post biopsy CT imaging demonstrates no evidence of immediate complication. The patient tolerated the procedure well. IMPRESSION: Technically successful CT-guided core biopsy of left upper quadrant omental nodule. Electronically Signed   By: Jacqulynn Cadet M.D.   On: 03/08/2018 14:48        Scheduled Meds: . fentaNYL       . metoprolol tartrate  75 mg Oral BID  . midazolam      . pantoprazole (PROTONIX) IV  40 mg Intravenous Q12H  . polyethylene glycol  17 g Oral Daily  . senna  1 tablet Oral Daily   Continuous Infusions: . sodium chloride Stopped (03/07/18 0722)  . heparin       LOS: 5 days    Time spent: 35 minutes.     Domenic Polite, MD [facility] Page via [email or web address] password [email or web address] If 7PM-7AM, please contact night-coverage  03/08/2018, 3:16 PM

## 2018-03-08 NOTE — Care Management Important Message (Signed)
Important Message  Patient Details  Name: [name] [name] MRN: [id number] Date of Birth: 11-04-1931   Medicare Important Message Given:  Yes    Farron Watrous [address] 03/08/2018, 1:55 PM

## 2018-03-08 NOTE — Progress Notes (Signed)
ANTICOAGULATION CONSULT NOTE - Follow up [place] for Heparin Indication: atrial fibrillation - apixaban on hold  No Known Allergies  Patient Measurements: Height: 5' (152.4 cm) Weight: 126 lb (57.2 kg) IBW/kg (Calculated) : 45.5 Heparin Dosing Weight: 57  Vital Signs: Temp: 98.2 F (36.8 C) (05/28 2115) Temp Source: Oral (05/28 2115) BP: 155/85 (05/28 2115) Pulse Rate: 73 (05/28 2115)  Labs: Recent Labs    03/06/18 0144 03/07/18 0435 03/07/18 1920 03/08/18 0426  HGB 10.4* 9.9*  --  10.5*  HCT 34.1* 32.3*  --  33.9*  PLT 230 232  --  260  APTT 88*  --   --   --   HEPARINUNFRC 0.62  --  0.28* 0.45  CREATININE 1.16*  --   --  1.10*   Estimated Creatinine Clearance: 30.7 mL/min (A) (by C-G formula based on SCr of 1.1 mg/dL (H)).   Assessment: 84yo female presents with c/o abdominal pain and CT results noted for mass. Patient on Eliquis 2.5mg  BID PTA for AFib- currently on IV heparin, last Eliquis dose on 5/23. Heparin resumed post colonoscopy. No overt bleeding noted. To have CT biopsy of omental mass by IR.  Heparin level therapeutic at 0.45 this morning. Per RN no problems with infusion or bleeding.  Goal of Therapy:  Heparin level 0.3-0.7 units/ml  Monitor platelets by anticoagulation protocol: Yes   Plan:  Continue heparin infusion 850 units/hr Daily heparin level and CBC   Thank you for allowing [place] to participate in this patients care. Jens Som, PharmD

## 2018-03-08 NOTE — Progress Notes (Signed)
ANTICOAGULATION CONSULT NOTE - Follow up [place] for Heparin Indication: atrial fibrillation - apixaban on hold  No Known Allergies  Patient Measurements: Height: 5' (152.4 cm) Weight: 126 lb (57.2 kg) IBW/kg (Calculated) : 45.5 Heparin Dosing Weight: 57  Vital Signs: Temp: 98.3 F (36.8 C) (05/29 1210) Temp Source: Oral (05/29 1210) BP: 177/92 (05/29 1415) Pulse Rate: 95 (05/29 1415)  Labs: Recent Labs    03/06/18 0144 03/07/18 0435 03/07/18 1920 03/08/18 0426  HGB 10.4* 9.9*  --  10.5*  HCT 34.1* 32.3*  --  33.9*  PLT 230 232  --  260  APTT 88*  --   --   --   HEPARINUNFRC 0.62  --  0.28* 0.45  CREATININE 1.16*  --   --  1.10*   Estimated Creatinine Clearance: 30.7 mL/min (A) (by C-G formula based on SCr of 1.1 mg/dL (H)).   Assessment: 84yo female presents with c/o abdominal pain and CT results noted for mass. Patient on Eliquis 2.5mg  BID PTA for AFib- currently on IV heparin, last Eliquis dose on 5/23.  Now s/p biopsy with plans to resume heparin 4 hours post procedure   Goal of Therapy:  Heparin level 0.3-0.7 units/ml  Monitor platelets by anticoagulation protocol: Yes   Plan:  Heparin to resume at 18:30 pm at 850 units / hr Daily heparin level and CBC   Thank you Anette Guarneri, PharmD [phone]

## 2018-03-08 NOTE — Procedures (Signed)
Interventional Radiology Procedure Note  Procedure: CT guided core biopsy of LUQ omental nodule  Complications: None  Estimated Blood Loss: None  Recommendations: - Bedrest x 1 hr - Path pending   Signed,  Criselda Peaches, MD

## 2018-03-09 ENCOUNTER — Encounter (HOSPITAL_COMMUNITY): Payer: Self-pay | Admitting: General Practice

## 2018-03-09 ENCOUNTER — Other Ambulatory Visit: Payer: Self-pay

## 2018-03-09 LAB — BASIC METABOLIC PANEL
Anion gap: 7 (ref 5–15)
BUN: 10 mg/dL (ref 6–20)
CALCIUM: 9 mg/dL (ref 8.9–10.3)
CO2: 29 mmol/L (ref 22–32)
CREATININE: 1.13 mg/dL — AB (ref 0.44–1.00)
Chloride: 103 mmol/L (ref 101–111)
GFR calc non Af Amer: 44 mL/min — ABNORMAL LOW (ref >60)
GFR, EST AFRICAN AMERICAN: 51 mL/min — AB (ref >60)
GLUCOSE: 101 mg/dL — AB (ref 65–99)
Potassium: 3.1 mmol/L — ABNORMAL LOW (ref 3.5–5.1)
Sodium: 139 mmol/L (ref 135–145)

## 2018-03-09 LAB — CBC
HCT: 35.5 % — ABNORMAL LOW (ref 36.0–46.0)
Hemoglobin: 11.2 g/dL — ABNORMAL LOW (ref 12.0–15.0)
MCH: 27.3 pg (ref 26.0–34.0)
MCHC: 31.5 g/dL (ref 30.0–36.0)
MCV: 86.6 fL (ref 78.0–100.0)
PLATELETS: 286 10*3/uL (ref 150–400)
RBC: 4.1 MIL/uL (ref 3.87–5.11)
RDW: 13.5 % (ref 11.5–15.5)
WBC: 8.5 10*3/uL (ref 4.0–10.5)

## 2018-03-09 LAB — HEPARIN LEVEL (UNFRACTIONATED): Heparin Unfractionated: 0.41 IU/mL (ref 0.30–0.70)

## 2018-03-09 MED ORDER — APIXABAN 2.5 MG PO TABS
2.5000 mg | ORAL_TABLET | Freq: Two times a day (BID) | ORAL | Status: DC
  Administered 2018-03-09: 2.5 mg via ORAL
  Filled 2018-03-09: qty 1

## 2018-03-09 MED ORDER — POTASSIUM CHLORIDE CRYS ER 20 MEQ PO TBCR
40.0000 meq | EXTENDED_RELEASE_TABLET | Freq: Once | ORAL | Status: AC
Start: 2018-03-09 — End: 2018-03-09
  Administered 2018-03-09: 40 meq via ORAL
  Filled 2018-03-09: qty 2

## 2018-03-09 MED ORDER — PANTOPRAZOLE SODIUM 40 MG PO TBEC
40.0000 mg | DELAYED_RELEASE_TABLET | Freq: Two times a day (BID) | ORAL | Status: DC
Start: 2018-03-09 — End: 2018-03-11
  Administered 2018-03-09 – 2018-03-11 (×4): 40 mg via ORAL
  Filled 2018-03-09 (×4): qty 1

## 2018-03-09 MED ORDER — ENOXAPARIN SODIUM 60 MG/0.6ML ~~LOC~~ SOLN
60.0000 mg | SUBCUTANEOUS | Status: DC
  Administered 2018-03-09: 60 mg via SUBCUTANEOUS
  Filled 2018-03-09 (×2): qty 0.6

## 2018-03-09 NOTE — Consult Note (Signed)
[facility] CM Primary Care Navigator  03/09/2018  [name] [name] Jun 06, 1932 [id number]   Met with patient and daughter ([name] [name])at the bedside to identify possible discharge needs. Patient reports having "worsening pain to right lower abdomen " that had led to this admission. (complex masses right adnexa and left ovary enlargement concern for ovarian neoplasm, right hydronephrosis)  Patient endorses Dr.James Maudie Mercury with [place] herprimary care provider.   [facility] [address] [facility] without any problem.  Patientverbalized managing herownmedications at [employer] out of the containers using "list down" system.  Patient reportsthat she was driving prior to admission but her daughter [name] [name] providing transportation to herdoctors' appointmentsif needed after discharge.  Patient lives at home with husband. Daughter mentioned that she is currently staying at patient's house, assisting with their needs and serves as their primary caregiver.  Discharge plan is still to be determined- work up for adnexal mass in progress,  per MD note,but patient is hopingto bedischarged homewhen ready.  Patientand daughter voiced understanding to call primary care provider's officewhenshereturnshomefor a post discharge follow-upvisitwithin1- 2 weeksor sooner if needs arise.Patient letter (with PCP's contact number) was provided as areminder.  Discussed with patientand daughter regarding [facility] CM services available for health managementand resourcesat home butverbalized having no currentneeds or concerns at this point.Daughter has been assisting patient in managing her health needs at home. Patient and daughter voiced understanding of needto seekreferral from primary care provider to [facility] care management if deemednecessaryand appropriate for anyservices in thefuture.  [facility] care  management information was provided for future needs thatpatient may have.  Patienthad opted and verbally agreed with EMMI calls to monitor with recovery at home.  Referral made for EMMIGeneralcalls after discharge.   For additional questions please contact:  [name] [name], BSN, RN-BC [facility] PRIMARY CARE Navigator Cell: [phone]

## 2018-03-09 NOTE — Progress Notes (Signed)
ANTICOAGULATION CONSULT NOTE - Follow up [place] for Heparin to Lovenox  Indication: atrial fibrillation - apixaban on hold  No Known Allergies  Patient Measurements: Height: 5' (152.4 cm) Weight: 126 lb (57.2 kg) IBW/kg (Calculated) : 45.5 Heparin Dosing Weight: 57  Vital Signs: Temp: 98.1 F (36.7 C) (05/30 1300) Temp Source: Oral (05/30 0535) BP: 177/85 (05/30 1300) Pulse Rate: 79 (05/30 1300)  Labs: Recent Labs    03/07/18 0435 03/07/18 1920 03/08/18 0426 03/09/18 0656  HGB 9.9*  --  10.5* 11.2*  HCT 32.3*  --  33.9* 35.5*  PLT 232  --  260 286  HEPARINUNFRC  --  0.28* 0.45 0.41  CREATININE  --   --  1.10* 1.13*   Estimated Creatinine Clearance: 29.9 mL/min (A) (by C-G formula based on SCr of 1.13 mg/dL (H)).   Assessment: 84yo female presents with c/o abdominal pain and CT results noted for mass. Patient on Eliquis 2.5mg  BID PTA for AFib- currently on IV heparin, last Eliquis dose on 5/23.  To Lovenox now pending plans for another biopsy CrCl = 29.9 ml / min Eliquis dose at 10 am  Goal of Therapy:  Heparin level 0.3-0.7 units/ml  Monitor platelets by anticoagulation protocol: Yes   Plan:  Lovenox 60 mg sq Q 24 hours starting tonight at 2200 pm Follow up CBC, Scr  Thank you Anette Guarneri, PharmD [phone]

## 2018-03-09 NOTE — Progress Notes (Signed)
PROGRESS NOTE    [name] [name]  DVV:[id number] DOB: [birth year] DOA: 03/03/2018 PCP: Jani Gravel, MD   Brief Narrative by Dr Sloan Leiter: [name] [name] is a 84 y.o. female with medical history significant of CAD status post remote CABG, atrial fibrillation on Eliquis, CVA without any residual deficits-presenting to the ED for evaluation of the above-noted complaints.  Per patient, approximately 2 weeks back-she started noticing pain in the right lower quadrant.  Pain initially resolved to over-the-counter medications-Tylenol/Motrin.  However pain gradually increased over the past few days.  Pain is dull, around 10/10 at its worse (per family) and without any radiation.  No particular aggravating factors, somewhat relieved by tramadol.  Patient does acknowledge some frequency of urination and dysuria.  Does not have any flank or back pain.  Patient went to her primary care doctor a few days back due to worsening right lower quadrant pain and was provided tramadol.  Unfortunately the pain continued to worsen, as a result the patient presented to the ED for further evaluation and treatment. Associated with nausea, and one episode of vomiting earlier this morning.  Patient was found to have bilateral adnexal masses and right side hydronephrosis from ureter obstruction.  GYN-oncology Dr Gerarda Fraction and urology was consulted. Dr Gerarda Fraction was recommending GI work up with endoscopy, colonoscopy to rule out GI malignancy. She underwent endoscopy/colonoscopy 5-28 which was negative for GI mass. Plan is to proceed with CT IR biopsy of omental mass.  Patient is currently on heparin Gtt  For A fib.   Assessment & Plan:   Complex Masses Right adnexa and left ovary enlargement concern for ovarian neoplasm.  Intractable right lower Quadrant abdominal pain. Related to adnexal masses:  -Pain controlled, on current regimen of morphine and Percocet when necessary -CEA 10, elevated.  -Gyn Dr Gerarda Fraction following, recommended GI  evaluation -this was completed with EGD and colonoscopy on 5/28 by Dr. Fayette Pho was unremarkable -IR consulted, s/p omental nodule biopsy 5/29, results pending -pending results will need coordination between Urology and GYn if surgical candidate  Anemia;  -hemoglobin stable, no evidence of active bleeding  Right Hydronephrosis, with adnexal Masses;  -creatinine normalized -urology consulted, Dr. Tresa Moore; thinks patient might need local resection, ureteral implant if patient is deem surgical candidate for resection  by GYN.  -await biopsy results  A fib RVR;  Chronic A fib; prior history of stroke.  -restarted Eliquis post biopsy, will stop this and start Lovenox which can be held incase another procedure needed Rate controlled   History of CVA; on eliquis at home  -on heparin bridge   DVT prophylaxis: lovenox Code Status: full code.  Family Communication: daughter at bedside.  Disposition Plan: to be determined , work up for adnexal mass in progress.    Consultants:   GI  GYN  Urology      Procedures:   US pelvis/    Antimicrobials:   none   Subjective: -feels better, no complaints now, pain controlled  Objective: Vitals:   03/08/18 2111 03/08/18 2143 03/09/18 0535 03/09/18 1300  BP: (!) 156/91 (!) 156/91 (!) 165/94 (!) 177/85  Pulse: 86 86 (!) 56 79  Resp: 17  17 20   Temp: 98.5 F (36.9 C)  98.1 F (36.7 C) 98.1 F (36.7 C)  TempSrc: Oral  Oral   SpO2: (!) 89%  92% 96%  Weight:      Height:       No intake or output data in the 24 hours ending 03/09/18 1415 Filed [place]  03/03/18 1106 03/07/18 0852  Weight: 57.2 kg (126 lb) 57.2 kg (126 lb)    Examination: Gen: Awake, Alert, Oriented X 3, elderly, chronically ill-appearing HEENT: PERRLA, Neck supple, no JVD Lungs: Good air movement bilaterally, CTAB CVS: RRR,No Gallops,Rubs or new Murmurs Abd: soft, Non tender, non distended, BS present Extremities: No Cyanosis, Clubbing or  edema Skin: no new rashes    Data Reviewed: I have personally reviewed following labs and imaging studies  CBC: Recent Labs  Lab 03/05/18 0309 03/06/18 0144 03/07/18 0435 03/24/2018 0426 03/09/18 0656  WBC 12.4* 11.2* 9.4 8.4 8.5  HGB 10.7* 10.4* 9.9* 10.5* 11.2*  HCT 34.9* 34.1* 32.3* 33.9* 35.5*  MCV 88.6 88.8 88.7 87.6 86.6  PLT 236 230 232 260 211   Basic Metabolic Panel: Recent Labs  Lab 03/03/18 1118 03/04/18 0356 03/06/18 0144 03-24-2018 0426 03/09/18 0656  NA 139 138 139 140 139  K 3.5 4.0 4.2 3.1* 3.1*  CL 103 105 107 104 103  CO2 26 25 24 29 29   GLUCOSE 120* 113* 106* 94 101*  BUN 21* 14 12 9 10   CREATININE 1.16* 1.19* 1.16* 1.10* 1.13*  CALCIUM 9.5 9.0 8.8* 8.9 9.0   GFR: Estimated Creatinine Clearance: 29.9 mL/min (A) (by C-G formula based on SCr of 1.13 mg/dL (H)). Liver Function Tests: Recent Labs  Lab 03/03/18 1118  AST 19  ALT 11*  ALKPHOS 55  BILITOT 0.7  PROT 6.9  ALBUMIN 3.8   Recent Labs  Lab 03/03/18 1118  LIPASE 31   No results for input(s): AMMONIA in the last 168 hours. Coagulation Profile: Recent Labs  Lab 03/04/18 0356  INR 1.06   Cardiac Enzymes: No results for input(s): CKTOTAL, CKMB, CKMBINDEX, TROPONINI in the last 168 hours. BNP (last 3 results) No results for input(s): PROBNP in the last 8760 hours. HbA1C: No results for input(s): HGBA1C in the last 72 hours. CBG: No results for input(s): GLUCAP in the last 168 hours. Lipid Profile: No results for input(s): CHOL, HDL, LDLCALC, TRIG, CHOLHDL, LDLDIRECT in the last 72 hours. Thyroid Function Tests: No results for input(s): TSH, T4TOTAL, FREET4, T3FREE, THYROIDAB in the last 72 hours. Anemia Panel: No results for input(s): VITAMINB12, FOLATE, FERRITIN, TIBC, IRON, RETICCTPCT in the last 72 hours. Sepsis Labs: No results for input(s): PROCALCITON, LATICACIDVEN in the last 168 hours.  No results found for this or any previous visit (from the past 240 hour(s)).        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 She presents for CT-guided biopsy of 1 of the omental nodules in the left upper quadrant. EXAM: CT BIOPSY MEDICATIONS: None. ANESTHESIA/SEDATION: Moderate (conscious) sedation was employed during this procedure. A total of Versed 1 mg and Fentanyl 50 mcg was administered intravenously. Moderate Sedation Time: 11 minutes. The patient's level of consciousness and vital signs were monitored continuously by radiology nursing throughout the procedure under my direct supervision. FLUOROSCOPY TIME:  Fluoroscopy Time: 0 minutes 0 seconds (0 mGy). COMPLICATIONS: None immediate. PROCEDURE: Informed written consent was obtained from the patient after a thorough discussion of the procedural risks, benefits and alternatives. All questions were addressed. Maximal Sterile Barrier Technique was utilized including caps, mask, sterile gowns, sterile gloves, sterile drape, hand hygiene and skin antiseptic. A timeout was performed prior to the initiation of the procedure. A planning axial CT scan was performed. Omental nodularity in the left upper quadrant was  successfully identified. A suitable skin entry site was selected and marked. Following sterile prep and drape in the standard fashion with chlorhexidine skin prep, local anesthesia was attained by infiltration with 1% lidocaine. A small dermatotomy was made. Under intermittent CT guidance, a 17 gauge introducer needle was advanced into the margin of the omental nodule. Multiple 18 gauge core biopsies were then obtained. Biopsy specimens were placed in formalin and delivered to pathology for further analysis. Post biopsy CT imaging demonstrates no evidence of immediate complication. The patient tolerated the procedure well. IMPRESSION: Technically successful CT-guided core biopsy of  left upper quadrant omental nodule. Electronically Signed   By: Jacqulynn Cadet M.D.   On: 03/08/2018 14:48        Scheduled Meds: . apixaban  2.5 mg Oral BID  . metoprolol tartrate  75 mg Oral BID  . pantoprazole  40 mg Oral BID  . polyethylene glycol  17 g Oral Daily  . senna  1 tablet Oral Daily   Continuous Infusions: . sodium chloride Stopped (03/07/18 0722)     LOS: 6 days    Time spent: 25 minutes.     Domenic Polite, MD [facility] Page via [email or web address] password [email or web address] If 7PM-7AM, please contact night-coverage  03/09/2018, 2:15 PM

## 2018-03-09 NOTE — Progress Notes (Signed)
ANTICOAGULATION CONSULT NOTE - Follow up [place] for Heparin Indication: atrial fibrillation - apixaban on hold  No Known Allergies  Patient Measurements: Height: 5' (152.4 cm) Weight: 126 lb (57.2 kg) IBW/kg (Calculated) : 45.5 Heparin Dosing Weight: 57  Vital Signs: Temp: 98.1 F (36.7 C) (05/30 0535) Temp Source: Oral (05/30 0535) BP: 165/94 (05/30 0535) Pulse Rate: 56 (05/30 0535)  Labs: Recent Labs    03/07/18 0435 03/07/18 1920 03/08/18 0426 03/09/18 0656  HGB 9.9*  --  10.5* 11.2*  HCT 32.3*  --  33.9* 35.5*  PLT 232  --  260 286  HEPARINUNFRC  --  0.28* 0.45 0.41  CREATININE  --   --  1.10* 1.13*   Estimated Creatinine Clearance: 29.9 mL/min (A) (by C-G formula based on SCr of 1.13 mg/dL (H)).   Assessment: 84yo female presents with c/o abdominal pain and CT results noted for mass. Patient on Eliquis 2.5mg  BID PTA for AFib- currently on IV heparin, last Eliquis dose on 5/23.  Heparin level therapeutic CBC stable Planning to resume Eliquis today  Goal of Therapy:  Heparin level 0.3-0.7 units/ml  Monitor platelets by anticoagulation protocol: Yes   Plan:  Continue heparin at 850 units / hr Daily heparin level and CBC   Thank you Anette Guarneri, PharmD [phone]

## 2018-03-10 ENCOUNTER — Encounter: Payer: Self-pay | Admitting: Hematology

## 2018-03-10 ENCOUNTER — Telehealth: Payer: Self-pay | Admitting: Hematology

## 2018-03-10 LAB — BASIC METABOLIC PANEL
Anion gap: 5 (ref 5–15)
BUN: 12 mg/dL (ref 6–20)
CALCIUM: 8.6 mg/dL — AB (ref 8.9–10.3)
CO2: 30 mmol/L (ref 22–32)
CREATININE: 1.26 mg/dL — AB (ref 0.44–1.00)
Chloride: 104 mmol/L (ref 101–111)
GFR calc Af Amer: 44 mL/min — ABNORMAL LOW (ref >60)
GFR, EST NON AFRICAN AMERICAN: 38 mL/min — AB (ref >60)
GLUCOSE: 90 mg/dL (ref 65–99)
Potassium: 3.2 mmol/L — ABNORMAL LOW (ref 3.5–5.1)
Sodium: 139 mmol/L (ref 135–145)

## 2018-03-10 LAB — CBC
HCT: 33.3 % — ABNORMAL LOW (ref 36.0–46.0)
Hemoglobin: 10.4 g/dL — ABNORMAL LOW (ref 12.0–15.0)
MCH: 27.2 pg (ref 26.0–34.0)
MCHC: 31.2 g/dL (ref 30.0–36.0)
MCV: 87.2 fL (ref 78.0–100.0)
PLATELETS: 278 10*3/uL (ref 150–400)
RBC: 3.82 MIL/uL — ABNORMAL LOW (ref 3.87–5.11)
RDW: 13.6 % (ref 11.5–15.5)
WBC: 7.3 10*3/uL (ref 4.0–10.5)

## 2018-03-10 MED ORDER — APIXABAN 2.5 MG PO TABS
2.5000 mg | ORAL_TABLET | Freq: Two times a day (BID) | ORAL | Status: DC
Start: 2018-03-10 — End: 2018-03-11
  Administered 2018-03-10 – 2018-03-11 (×3): 2.5 mg via ORAL
  Filled 2018-03-10 (×3): qty 1

## 2018-03-10 MED ORDER — AMLODIPINE BESYLATE 2.5 MG PO TABS
2.5000 mg | ORAL_TABLET | Freq: Every day | ORAL | Status: DC
  Administered 2018-03-10 – 2018-03-11 (×2): 2.5 mg via ORAL
  Filled 2018-03-10 (×2): qty 1

## 2018-03-10 NOTE — Progress Notes (Addendum)
PROGRESS NOTE    [name] [name]  DGL:[id number] DOB: January 05, 1932 DOA: 03/03/2018 PCP: Jani Gravel, MD   Brief Narrative by Dr Sloan Leiter: [name] [name] is a 84 y.o. female with medical history significant of CAD status post remote CABG, atrial fibrillation on Eliquis, CVA without any residual deficits-presenting to the EDwith RLQ pain x2 weeks -Patient was found to have bilateral adnexal masses and right side hydronephrosis from ureter obstruction.  GYN-oncology Dr Gerarda Fraction and urology was consulted. Dr Gerarda Fraction was recommending GI work up with endoscopy, colonoscopy to rule out GI malignancy. She underwent endoscopy/colonoscopy 5-28 which was negative for GI mass. -then underwent CT IR biopsy of omental nodule on 5/28  Assessment & Plan:   Metastatic Appendiceal adenoCA-New Diagnosis -presented with RLQ pain and CT noted Complex Masses Right adnexa and left ovary enlargement -Pain controlled, on current regimen of morphine and Percocet when necessary -CEA -elevated at 10  -Gyn Dr Precious Haws consulted due to suspicion of GYN CA originally she recommended GI evaluation -this was completed with EGD and colonoscopy on 5/28 by Dr. Fayette Pho was unremarkable -Then IR consulted, s/p omental nodule biopsy 5/29, path positive for adenoCA-suggestive of metastatic appendiceal AdenoCA -made FU with Oncology Dr.Fang for next week -called Surgical Onc at [place] office, will send over Notes, Images and Path report as requested  Anemia;  -hemoglobin stable, no evidence of active bleeding  Right Hydronephrosis, with adnexal Masses;  -creatinine normalized -urology consulted, Dr. Tresa Moore; thinks patient may need local resection down the road -called and d/w [facility] urology again, no plan for ureteral surgery or stenting at this time given normal kidney function, agreed with referral to tertiary center  A fib RVR;  Chronic A fib; prior history of stroke.  -restarted Eliquis post  biopsy  History of CVA; on eliquis at home  -eliquis resumed   DVT prophylaxis: Eliquis the Code Status: full code.  Family Communication: daughter at bedside.  Disposition Plan: Home tomorrow   Consultants:   GI  GYN  Urology      Procedures:   EGD/Colonsocopy 5/26  Omental nodule biopsy 5/28   Antimicrobials:   none   Subjective: ,-feels ok, symptoms mostly controlled  Objective: Vitals:   03/09/18 2340 03/10/18 0546 03/10/18 0549 03/10/18 0647  BP: (!) 162/98 (!) 184/94 (!) 189/97 (!) 167/93  Pulse:  81 75   Resp:  18    Temp:      TempSrc:      SpO2:  96%    Weight:      Height:        Intake/Output Summary (Last 24 hours) at 03/10/2018 1503 Last data filed at 03/10/2018 1100 Gross per 24 hour  Intake 300 ml  Output -  Net 300 ml   Filed Weights   03/03/18 1106 03/07/18 0852  Weight: 57.2 kg (126 lb) 57.2 kg (126 lb)    Examination: Gen: Awake, Alert, Oriented X 3, elderly chronically ill female HEENT: PERRLA, Neck supple, no JVD Lungs: Good air movement bilaterally, CTAB CVS: RRR,No Gallops,Rubs or new Murmurs Abd: soft, Non tender, non distended, BS present Extremities: No Cyanosis, Clubbing or edema Skin: no new rashes     Data Reviewed: I have personally reviewed following labs and imaging studies  CBC: Recent Labs  Lab 03/06/18 0144 03/07/18 0435 03/08/18 0426 03/09/18 0656 03/10/18 0512  WBC 11.2* 9.4 8.4 8.5 7.3  HGB 10.4* 9.9* 10.5* 11.2* 10.4*  HCT 34.1* 32.3* 33.9* 35.5* 33.3*  MCV 88.8 88.7 87.6 86.6  87.2  PLT 230 232 260 286 034   Basic Metabolic Panel: Recent Labs  Lab 03/04/18 0356 03/06/18 0144 03/08/18 0426 03/09/18 0656 03/10/18 0512  NA 138 139 140 139 139  K 4.0 4.2 3.1* 3.1* 3.2*  CL 105 107 104 103 104  CO2 25 24 29 29 30   GLUCOSE 113* 106* 94 101* 90  BUN 14 12 9 10 12   CREATININE 1.19* 1.16* 1.10* 1.13* 1.26*  CALCIUM 9.0 8.8* 8.9 9.0 8.6*   GFR: Estimated Creatinine Clearance: 26.8  mL/min (A) (by C-G formula based on SCr of 1.26 mg/dL (H)). Liver Function Tests: No results for input(s): AST, ALT, ALKPHOS, BILITOT, PROT, ALBUMIN in the last 168 hours. No results for input(s): LIPASE, AMYLASE in the last 168 hours. No results for input(s): AMMONIA in the last 168 hours. Coagulation Profile: Recent Labs  Lab 03/04/18 0356  INR 1.06   Cardiac Enzymes: No results for input(s): CKTOTAL, CKMB, CKMBINDEX, TROPONINI in the last 168 hours. BNP (last 3 results) No results for input(s): PROBNP in the last 8760 hours. HbA1C: No results for input(s): HGBA1C in the last 72 hours. CBG: No results for input(s): GLUCAP in the last 168 hours. Lipid Profile: No results for input(s): CHOL, HDL, LDLCALC, TRIG, CHOLHDL, LDLDIRECT in the last 72 hours. Thyroid Function Tests: No results for input(s): TSH, T4TOTAL, FREET4, T3FREE, THYROIDAB in the last 72 hours. Anemia Panel: No results for input(s): VITAMINB12, FOLATE, FERRITIN, TIBC, IRON, RETICCTPCT in the last 72 hours. Sepsis Labs: No results for input(s): PROCALCITON, LATICACIDVEN in the last 168 hours.  No results found for this or any previous visit (from the past 240 hour(s)).       Radiology Studies: No results found.      Scheduled Meds: . amLODipine  2.5 mg Oral Daily  . apixaban  2.5 mg Oral BID  . metoprolol tartrate  75 mg Oral BID  . pantoprazole  40 mg Oral BID  . polyethylene glycol  17 g Oral Daily  . senna  1 tablet Oral Daily   Continuous Infusions: . sodium chloride Stopped (03/07/18 0722)     LOS: 7 days    Time spent: 25 minutes.     Domenic Polite, MD [facility] Page via [email or web address] password [email or web address] If 7PM-7AM, please contact night-coverage  03/10/2018, 3:03 PM

## 2018-03-10 NOTE — Telephone Encounter (Signed)
Received a call from Dr. Broadus John at the hospital to schedule the pt an oncology appt. Pt has been scheduled to see Dr. Burr Medico on 6/6 at 230pm. Letter mailed.

## 2018-03-10 NOTE — Progress Notes (Signed)
ANTICOAGULATION CONSULT NOTE - Follow up [place] for apixaban Indication: atrial fibrillation   No Known Allergies  Patient Measurements: Height: 5' (152.4 cm) Weight: 126 lb (57.2 kg) IBW/kg (Calculated) : 45.5 Heparin Dosing Weight: 57  Vital Signs: BP: 167/93 (05/31 0647) Pulse Rate: 75 (05/31 0549)  Labs: Recent Labs    03/07/18 1920  03/08/18 0426 03/09/18 0656 03/10/18 0512  HGB  --    < > 10.5* 11.2* 10.4*  HCT  --   --  33.9* 35.5* 33.3*  PLT  --   --  260 286 278  HEPARINUNFRC 0.28*  --  0.45 0.41  --   CREATININE  --   --  1.10* 1.13* 1.26*   < > = values in this interval not displayed.   Estimated Creatinine Clearance: 26.8 mL/min (A) (by C-G formula based on SCr of 1.26 mg/dL (H)).   Assessment: 84 yo female presents with c/o abdominal pain and CT results noted for mass s/p biopsy - results indicating GI as primary. On apixaban PTA for Afib.  Pharmacy consulted to transition back to apixaban. Last Lovenox dose last night. No bleeding noted. Low dose apixaban appropriate for age and weight.  Plan:  Discontinue Lovenox  Apixaban 2.5 mg PO bid Pharmacy signing off but will continue to follow peripherally   Renold Genta, PharmD, BCPS Clinical Pharmacist Clinical phone for 03/10/2018 until 4p is [phone] After 4p, please call Main Rx at [phone] for assistance 03/10/2018 11:04 AM

## 2018-03-10 NOTE — Progress Notes (Signed)
Discussed with pathology IHC is showing GI primary, suspect appendiceal.  Recommend referral to [facility] Dr. Ronnette Hila for operative options/management.  Would ask urology about urgency of stent prior to discharge. We will sign off at this time.

## 2018-03-11 MED ORDER — POLYETHYLENE GLYCOL 3350 17 G PO PACK: 17.0000 g | PACK | Freq: Two times a day (BID) | ORAL | 0 refills | Status: AC

## 2018-03-11 MED ORDER — SENNA 8.6 MG PO TABS: 2.0000 | ORAL_TABLET | Freq: Every day | ORAL | 1 refills | Status: AC

## 2018-03-11 MED ORDER — PANTOPRAZOLE SODIUM 40 MG PO TBEC: 40.0000 mg | DELAYED_RELEASE_TABLET | Freq: Two times a day (BID) | ORAL | 0 refills | Status: AC

## 2018-03-11 MED ORDER — OXYCODONE-ACETAMINOPHEN 5-325 MG PO TABS: 1.0000 | ORAL_TABLET | Freq: Four times a day (QID) | ORAL | 0 refills | Status: AC | PRN

## 2018-03-11 NOTE — Progress Notes (Signed)
[name] [name] to be D/C'd Home per MD order.  Discussed with the patient and all questions fully answered.  VSS, Skin clean, dry and intact without evidence of skin break down, no evidence of skin tears noted. IV catheter discontinued intact. Site without signs and symptoms of complications. Dressing and pressure applied.  An After Visit Summary was printed and given to the patient. Patient received prescription.  D/c education completed with patient/family including follow up instructions, medication list, d/c activities limitations if indicated, with other d/c instructions as indicated by MD - patient able to verbalize understanding, all questions fully answered.   Patient instructed to return to ED, call 911, or call MD for any changes in condition.   Patient escorted via [place], and D/C home via private auto.  Luci Bank 03/11/2018 12:20 PM

## 2018-03-11 NOTE — Discharge Instructions (Signed)

## 2018-03-11 NOTE — Discharge Summary (Signed)
Physician Discharge Summary  [name] TIW:[id number] DOB: 1932-01-24 DOA: 03/03/2018  PCP: Jani Gravel, MD  Admit date: 03/03/2018 Discharge date: 03/11/2018  Time spent: 45 minutes  Recommendations for Outpatient Follow-up:  1. PCP in 1 week, please monitor kidney function 2. Oncology Dr.Feng on 6/6 at 2:30pm 3. Referral made to Dr.Edward Clovis Riley -Surgical Oncology at [facility]   Discharge Diagnoses:    Metastatic Appendiceal adenoCA-New Diagnosis   Intractable right lower quadrant abdominal pain   CAD (coronary artery disease)   Stroke with cerebral ischemia [facility])   Atrial fibrillation ([place])   Pulmonary hypertension ([facility])   Discharge Condition: stable  Diet recommendation: heart healthy  Filed Weights   03/03/18 1106 03/07/18 0852  Weight: 57.2 kg (126 lb) 57.2 kg (126 lb)    History of present illness:  [name] [name] a 84 y.o.femalewith medical history significant ofCAD status post remote CABG, atrial fibrillation on Eliquis, CVA without any residual deficits-presenting to the EDwith RLQ pain x2 weeks -Patient was found to have bilateral adnexal masses and right side hydronephrosis from ureter obstruction.   Hospital Course:   Metastatic Appendiceal adenoCA-New Diagnosis -presented with RLQ pain and CT noted Complex Masses Right adnexa and left ovary enlargement, and R sided hydronephrosis -GYN-oncology Dr Precious Haws and urology was consulted. Dr Gerarda Fraction recommended GI work up with endoscopy, colonoscopy to rule out GI malignancy.  -She underwent endoscopy/colonoscopy 5-28 which was negative for GI mass. -then underwent CT IR biopsy of omental nodule on 5/28 -this biopsy came back as metastatic adenocarcinoma suspected to be appendiceal -CEA -elevated at 10  -patient is clinically stable now on a regimen of narcotics and laxatives for symptom control -made FU with Oncology Dr.Feng for next week 6/6 at [facility]. -I also called Surgical  Oncology at [place] office,  Per their  request sent over progress notes, CT scan, pathology report, and demographics, they will call patient.  Anemia of chronic disease -hemoglobin stable  Right Hydronephrosis, with adnexal Masses;  -creatinine normal -urology consulted, Dr. Tresa Moore; thinks patient may need local resection down the road -called and d/w [facility] urology again, no plan for ureteral surgery or stenting at this time given normal kidney function, agreed with referral to tertiary center -she will need her kidney function monitored closely  A fib RVR;  Chronic A fib; prior history of stroke.  -restarted Eliquis post biopsy  History of CVA; on eliquis at home  -eliquis resumed     Discharge Exam: Vitals:   03/11/18 0528 03/11/18 0919  BP: (!) 185/96 (!) 155/85  Pulse: 88 80  Resp: 18   Temp: 97.8 F (36.6 C)   SpO2: 94%     General: AAOx3 Cardiovascular: S1S2/RRR Respiratory: CTAB  Discharge Instructions   Discharge Instructions    Diet - low sodium heart healthy   Complete by:  As directed    Increase activity slowly   Complete by:  As directed      Allergies as of 03/11/2018   No Known Allergies     Medication List    STOP taking these medications   traMADol 50 MG tablet Commonly known as:  ULTRAM     TAKE these medications   acetaminophen 500 MG tablet Commonly known as:  TYLENOL Take 500 mg by mouth every 6 (six) hours as needed for moderate pain (pain).   amLODipine 2.5 MG tablet Commonly known as:  NORVASC Take 2.5 mg by mouth daily.   BOTOX IJ Inject as directed every  3 (three) months.   carboxymethylcellulose 0.5 % Soln Commonly known as:  REFRESH PLUS Place 1 drop into both eyes as needed (dry eyes).   ELIQUIS 2.5 MG Tabs tablet Generic drug:  apixaban TAKE 1 TABLET BY MOUTH TWICE A DAY   levothyroxine 25 MCG tablet Commonly known as:  SYNTHROID, LEVOTHROID Take 25 mcg by mouth daily. Per Physician  take once weekly   metoprolol tartrate 50 MG tablet Commonly known as:  LOPRESSOR Take 50 mg by mouth 2 (two) times daily.   nitroGLYCERIN 0.4 MG SL tablet Commonly known as:  NITROSTAT Place 0.4 mg under the tongue every 5 (five) minutes as needed for chest pain.   oxyCODONE-acetaminophen 5-325 MG tablet Commonly known as:  PERCOCET/ROXICET Take 1 tablet by mouth every 6 (six) hours as needed for moderate pain.   pantoprazole 40 MG tablet Commonly known as:  PROTONIX Take 1 tablet (40 mg total) by mouth 2 (two) times daily.   polyethylene glycol packet Commonly known as:  MIRALAX / GLYCOLAX Take 17 g by mouth 2 (two) times daily.   senna 8.6 MG Tabs tablet Commonly known as:  SENOKOT Take 2 tablets (17.2 mg total) by mouth daily. Start taking on:  03/12/2018      No Known Allergies Follow-up Information    Jani Gravel, MD. Schedule an appointment as soon as possible for a visit in 1 week(s).   Specialty:  Internal Medicine Contact information: [address] [place] [address] [phone]        Truitt Merle, MD Follow up on 03/16/2018.   Specialties:  Hematology, Oncology Why:  2:30pm Contact information: [place] [address] [phone]            The results of significant diagnostics from this hospitalization (including imaging, microbiology, ancillary and laboratory) are listed below for reference.    Significant Diagnostic Studies: US Pelvis Transvanginal Non-ob (tv Only)  Result Date: 03/04/2018 CLINICAL DATA:  Right-sided ovarian mass on CT. EXAM: TRANSABDOMINAL AND TRANSVAGINAL ULTRASOUND OF PELVIS TECHNIQUE: Both transabdominal and transvaginal ultrasound examinations of the pelvis were performed. Transabdominal technique was performed for global imaging of the pelvis including uterus, ovaries, adnexal regions, and pelvic cul-de-sac. It was necessary to proceed with endovaginal exam following the  transabdominal exam to visualize the ovary/adnexa. COMPARISON:  CT of 1 day prior FINDINGS: Uterus Surgically absent Right ovary Measurements: 3.9 x 2.2 x 3.7 cm. Partially calcified right ovarian lesion of 2.3 x 1.9 x 1.4 cm, including on image 37. Just inferior to this, a partially cystic, partially solid hypoechoic lesion measures 1.6 x 1.2 x 0.9 cm. Left ovary Measurements: 4.9 x 4.4 x 4.2 cm. Primarily solid with central cystic component lesion measures 2.5 x 2.3 x 1.6 cm. Other findings Trace free pelvic fluid. There is also fluid adjacent the left ovary. IMPRESSION: 1. Bilateral ovarian lesions, including a calcified right-sided lesion and a dominant solid left-sided lesion. Suspicious for benign or malignant ovarian neoplasm(s). Consider gynecological consultation. If the patient is a surgical candidate, pre and postcontrast MRI may be informative. 2. Small volume cul-de-sac and left adnexal fluid. Electronically Signed   By: Abigail Miyamoto M.D.   On: 03/04/2018 15:21   US Pelvis (transabdominal Only)  Result Date: 03/04/2018 CLINICAL DATA:  Right-sided ovarian mass on CT. EXAM: TRANSABDOMINAL AND TRANSVAGINAL ULTRASOUND OF PELVIS TECHNIQUE: Both transabdominal and transvaginal ultrasound examinations of the pelvis were performed. Transabdominal technique was performed for global imaging of the pelvis including uterus,  ovaries, adnexal regions, and pelvic cul-de-sac. It was necessary to proceed with endovaginal exam following the transabdominal exam to visualize the ovary/adnexa. COMPARISON:  CT of 1 day prior FINDINGS: Uterus Surgically absent Right ovary Measurements: 3.9 x 2.2 x 3.7 cm. Partially calcified right ovarian lesion of 2.3 x 1.9 x 1.4 cm, including on image 37. Just inferior to this, a partially cystic, partially solid hypoechoic lesion measures 1.6 x 1.2 x 0.9 cm. Left ovary Measurements: 4.9 x 4.4 x 4.2 cm. Primarily solid with central cystic component lesion measures 2.5 x 2.3 x 1.6 cm.  Other findings Trace free pelvic fluid. There is also fluid adjacent the left ovary. IMPRESSION: 1. Bilateral ovarian lesions, including a calcified right-sided lesion and a dominant solid left-sided lesion. Suspicious for benign or malignant ovarian neoplasm(s). Consider gynecological consultation. If the patient is a surgical candidate, pre and postcontrast MRI may be informative. 2. Small volume cul-de-sac and left adnexal fluid. Electronically Signed   By: Abigail Miyamoto M.D.   On: 03/04/2018 15:21   Ct Abdomen Pelvis W Contrast  Result Date: 03/03/2018 CLINICAL DATA:  Abdominal pain, primarily on the right EXAM: CT ABDOMEN AND PELVIS WITH CONTRAST TECHNIQUE: Multidetector CT imaging of the abdomen and pelvis was performed using the standard protocol following bolus administration of intravenous contrast. Oral contrast was also administered. CONTRAST:  1mL OMNIPAQUE IOHEXOL 300 MG/ML  SOLN COMPARISON:  Abdominal ultrasound August 19, 2014 FINDINGS: Lower chest: There is bibasilar atelectasis. There are foci of coronary artery calcification. There is a sizable hiatal type hernia. Hepatobiliary: No focal liver lesions are evident on this noncontrast enhanced study. Gallbladder wall is not appreciably thickened. There is no biliary duct dilatation. Pancreas: There is no well-defined mass seen in the pancreas. No inflammatory focus evident. There are benign-appearing calcifications in the body of the pancreas which may represent residua of prior pancreatitis. No pancreatic duct dilatation evident. Spleen: No splenic lesions evident. Adrenals/Urinary Tract: Adrenals bilaterally appear normal. There is no renal mass on either side. There is decreased enhancement of the right kidney compared to the left kidney. There is severe hydronephrosis on the right. No hydronephrosis is evident on the left. There is no renal or ureteral calculus on either side. There is diffuse dilatation of the left ureter to its distal  aspect. In the region of the distal left ureter, there is wall thickening with what appears to be a right adnexal mass either impressing upon or frankly invading the right distal ureter. Urinary bladder is midline with wall thickness within normal limits. Stomach/Bowel: There is no appreciable bowel wall or mesenteric thickening. No evident bowel obstruction. No free air or portal venous air. Vascular/Lymphatic: There is atherosclerotic calcification in the aorta and common iliac arteries. There is also common femoral artery atherosclerosis bilaterally. No aneurysm evident. Major mesenteric arterial vessels appear patent. There is no appreciable adenopathy in the abdomen or pelvis. Reproductive: Uterus absent. There is a complex mass in the right adnexal region containing calcification. This mass measures 5.0 x 4.9 x 4.2 cm. There is enlargement of the left ovary with mixed attenuation measuring 4.8 x 4.2 x 3.9 cm. Other: There is a mass in the right adnexal region immediately adjacent to the larger mass arising from the right ovary measuring 2.5 x 2.4 x 2.4 cm. This mass may well represent an omental lesion immediately adjacent to the right ovary which is either invading or impressing upon the distal right ureter. No similar lesions elsewhere. No abscess or ascites is present  in the abdomen or pelvis. Appendix not seen. It is conceivable that the mass in the right adnexal region represents an appendiceal neoplasm as opposed to an ovarian neoplasm. Musculoskeletal: There are pars defects bilaterally at L5 with 7 mm of anterolisthesis of L5 on S1. There is degenerative change in the lumbar spine. There are no blastic or lytic bone lesions. There is no intramuscular or abdominal wall lesion. IMPRESSION: 1. There are complex masses in the right adnexal region. One of these masses either extends into or compresses the distal right ureter. The larger of these right adnexal masses measures 5.0 x 4.9 x 4.2 cm. There is an  adjacent mass measuring 2.5 x 2.4 x 2.4 cm. The appearance is concerning for ovarian neoplastic lesions on the right, possibly with an immediately adjacent omental lesion impressing upon or invading the right ureter. It should be noted that a normal appendix is not seen. A neoplastic lesion involving the appendix located in the right adnexal region must be considered a differential consideration. There is no inflammation in these areas. 2. Enlarged left ovary with inhomogeneous appearance. Concern for left ovarian neoplasm also present. 3. Severe hydronephrosis and ureterectasis on the right with decreased enhancement right kidney compared to the left consistent with the obstructing focus in the distal ureter region on the right. 4. No bowel obstruction. No abscess. No renal or ureteral calculi. No hydronephrosis. 5.  No adenopathy or liver lesions evident. 6. Aortoiliac atherosclerosis. Femoral artery atherosclerosis. There is coronary artery calcification as well. 7.  Pars defects at L5 bilaterally with spondylolisthesis at L5-S1. 8.  Uterus absent. 9.  Sizable hiatal type hernia. Comment: Pelvic MR nonemergently may be helpful for further delineation of the lesions in the right adnexal region. Aortic Atherosclerosis (ICD10-I70.0). Electronically Signed   By: Lowella Grip III M.D.   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 She presents for CT-guided biopsy of 1 of the omental nodules in the left upper quadrant. EXAM: CT BIOPSY MEDICATIONS: None. ANESTHESIA/SEDATION: Moderate (conscious) sedation was employed during this procedure. A total of Versed 1 mg and Fentanyl 50 mcg was administered intravenously. Moderate Sedation Time: 11 minutes. The patient's level of consciousness and vital signs were monitored continuously by radiology nursing throughout the  procedure under my direct supervision. FLUOROSCOPY TIME:  Fluoroscopy Time: 0 minutes 0 seconds (0 mGy). COMPLICATIONS: None immediate. PROCEDURE: Informed written consent was obtained from the patient after a thorough discussion of the procedural risks, benefits and alternatives. All questions were addressed. Maximal Sterile Barrier Technique was utilized including caps, mask, sterile gowns, sterile gloves, sterile drape, hand hygiene and skin antiseptic. A timeout was performed prior to the initiation of the procedure. A planning axial CT scan was performed. Omental nodularity in the left upper quadrant was successfully identified. A suitable skin entry site was selected and marked. Following sterile prep and drape in the standard fashion with chlorhexidine skin prep, local anesthesia was attained by infiltration with 1% lidocaine. A small dermatotomy was made. Under intermittent CT guidance, a 17 gauge introducer needle was advanced into the margin of the omental nodule. Multiple 18 gauge core biopsies were then obtained. Biopsy specimens were placed in formalin and delivered to pathology for further analysis. Post biopsy CT imaging demonstrates no evidence of immediate complication. The patient tolerated the procedure well. IMPRESSION: Technically successful CT-guided core biopsy of left upper quadrant omental nodule.  Electronically Signed   By: Jacqulynn Cadet M.D.   On: 03/08/2018 14:48    Microbiology: No results found for this or any previous visit (from the past 240 hour(s)).   Labs: Basic Metabolic Panel: Recent Labs  Lab 03/06/18 0144 03/08/18 0426 03/09/18 0656 03/10/18 0512  NA 139 140 139 139  K 4.2 3.1* 3.1* 3.2*  CL 107 104 103 104  CO2 24 29 29 30   GLUCOSE 106* 94 101* 90  BUN 12 9 10 12   CREATININE 1.16* 1.10* 1.13* 1.26*  CALCIUM 8.8* 8.9 9.0 8.6*   Liver Function Tests: No results for input(s): AST, ALT, ALKPHOS, BILITOT, PROT, ALBUMIN in the last 168 hours. No results  for input(s): LIPASE, AMYLASE in the last 168 hours. No results for input(s): AMMONIA in the last 168 hours. CBC: Recent Labs  Lab 03/06/18 0144 03/07/18 0435 03/08/18 0426 03/09/18 0656 03/10/18 0512  WBC 11.2* 9.4 8.4 8.5 7.3  HGB 10.4* 9.9* 10.5* 11.2* 10.4*  HCT 34.1* 32.3* 33.9* 35.5* 33.3*  MCV 88.8 88.7 87.6 86.6 87.2  PLT 230 232 260 286 278   Cardiac Enzymes: No results for input(s): CKTOTAL, CKMB, CKMBINDEX, TROPONINI in the last 168 hours. BNP: BNP (last 3 results) No results for input(s): BNP in the last 8760 hours.  ProBNP (last 3 results) No results for input(s): PROBNP in the last 8760 hours.  CBG: No results for input(s): GLUCAP in the last 168 hours.     Signed:  Domenic Polite MD.  [facility] 03/11/2018, 12:32 PM

## 2018-03-15 DIAGNOSIS — R109 Unspecified abdominal pain: Secondary | ICD-10-CM | POA: Diagnosis not present

## 2018-03-15 NOTE — Progress Notes (Signed)
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 There are complex masses in the right adnexal region. One of these masses either extends into or compresses the distal right ureter. The larger of these right adnexal masses measures 5.0 x 4.9 x 4.2 cm. There is an adjacent mass measuring 2.5 x 2.4 x 2.4 cm. The appearance is concerning for ovarian neoplastic lesions on the right, possibly with an immediately adjacent omental lesion impressing upon or invading the right ureter. It should be noted that a normal appendix is not seen. A neoplastic lesion involving the appendix located in the right adnexal region must be considered a differential consideration. There is no inflammation in these areas.  2. Enlarged left ovary with inhomogeneous appearance. Concern for left ovarian neoplasm also present.  3. Severe hydronephrosis and ureterectasis on the right with decreased enhancement right kidney compared to the left consistent with the obstructing focus in the distal ureter region on the right.  4. No bowel obstruction. No abscess. No renal or ureteral calculi. No hydronephrosis.  5.  No adenopathy or liver lesions evident.  6. Aortoiliac atherosclerosis. Femoral artery atherosclerosis. There is coronary artery calcification as well.  7.  Pars defects at L5 bilaterally with spondylolisthesis at L5-S1.  8.  Uterus absent.  9.  Sizable hiatal type hernia.  Comment: Pelvic MR nonemergently may be helpful for further delineation of the lesions in the right adnexal region.  Aortic Atherosclerosis (ICD10-I70.0).      03/04/2018 Imaging    US Pelvis 03/04/18  IMPRESSION: 1. Bilateral ovarian lesions, including a calcified right-sided lesion and a dominant solid left-sided lesion. Suspicious for benign or malignant ovarian neoplasm(s). Consider gynecological consultation. If the patient is a surgical candidate, pre and postcontrast MRI may be informative. 2. Small volume cul-de-sac and left adnexal fluid.      03/08/2018 Initial Biopsy    Diagnosis 03/08/18 Soft Tissue Needle Core Biopsy, Omental Nodule - METASTATIC ADENOCARCINOMA WITH SIGNET RING CELLS AND EXTRACELLULAR MUCIN, SEE COMMENT. Microscopic Comment Immunohistochemistry is positive for CDX-2, cytokeratin 20, and cytokeratin 7 (weak focal). PAX-8 is negative. The morphology and immunophenotype are consistent with a gastrointestinal (including appendiceal) or pancreaticobiliary primary. Dr. Lyndon Code has reviewed the case. The case was discussed with Dr. Gerarda Fraction on 03/10/2018.      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 female is a here because of newly diagnosed metastatic appendiceal cancer. The patient was referred by Dr. Paulita Fujita. The patient presents to the clinic today accompanied by her daughter and granddaughter.  Pt notes she has been sick with mild abdominal pain intermittently for almost 6 months.  It was unclear for how long given she initially thought the cause was something else. She went to her PCP for Tramadol for moderate pain. Then her pain progressed recently before presenting to the hospital. She notes her appetite is steady but has lost a few pounds. She has constipation which presented from pain medication. She has been on senokot as needed due to her history of loose bowel movement. She takes percocet  q6hours which helps control her pain. She has insomnia recently but is not sure if this is related to pain. She notes having issues with  her hemorrhoids which seldom bleed.   Today the patient notes she has energy to do what she can do. Her husband is sick and is currently in hospital for hip surgery. She has been taking care of him for 4 years as he is in his 43s. Socially she moved in with her daughter recently to help take care of her husband. At baseline she was overall very active. She still drives and overall independent. Her daughter is her POA and point of contact as her mother often has trouble speaking. Pt saw her PCP yesterday.   She has spasmodic dysphonia which causes spasms in her throat and spasms in her eyes which effect her voice and eyes will close up. She had been getting shots to release this often but not recently. She is able to eat solid food fine. She had weakness and numbness in her left side but was not found ot have stroke. She sees neurologist for this. She had open heart surgery in 1990s with 5 bypasses. She is on metoprolol. She has Afib as well, she is on eliquis. She had hysterectomy in the past. She has no family history of cancer. She quit smoking in 1960 after smoking for 3-4 years.     MEDICAL HISTORY:  Past Medical History:  Diagnosis Date  . CAD (coronary artery disease)    multivessel  . H/O atrial fibrillation   . H/O: CVA (cerebrovascular accident)    presented 72 with slurred speech. No residual deficit  . Hiatal hernia   . Hypercholesterolemia   . Hypertension   . Iron deficiency   . Osteopenia   . Spasmodic dysphonia   . Vitamin B12 deficiency     SURGICAL HISTORY: Past Surgical History:  Procedure Laterality Date  . COLONOSCOPY WITH PROPOFOL N/A 03/07/2018   Procedure: COLONOSCOPY WITH PROPOFOL;  Surgeon: Arta Silence, MD;  Location: [facility];  Service: Endoscopy;  Laterality: N/A;  . CORONARY ARTERY BYPASS GRAFT     x 5  . ESOPHAGOGASTRODUODENOSCOPY (EGD) WITH PROPOFOL N/A 03/07/2018   Procedure: ESOPHAGOGASTRODUODENOSCOPY (EGD) WITH PROPOFOL;  Surgeon: Arta Silence, MD;  Location: [facility];  Service: Endoscopy;  Laterality: N/A;  . left breast biopsy    . spasmodic dysphonia    . VAGINAL HYSTERECTOMY     for prolapse    SOCIAL HISTORY: Social History   Socioeconomic History  . Marital status: Married    Spouse name: Not on file  . Number of children: 1  . Years of education: 12th  . Highest education level: Not on file  Occupational History  . Occupation: retired    Comment: Landscape architect: RETIRED  Social Needs  . Financial resource strain: Not on file  . Food insecurity:    Worry: Not on file    Inability: Not on file  . Transportation needs:    Medical: Not on file    Non-medical: Not on file  Tobacco Use  . Smoking status: Former Smoker    Packs/day: 3.00    Years: 0.50    Pack years: 1.50    Last attempt to quit: 10/11/1958    Years since quitting: 59.4  . Smokeless tobacco: Never Used  Substance and Sexual Activity  . Alcohol use: No    Alcohol/week: 0.0 oz  . Drug use: No  . Sexual  activity: Not Currently  Lifestyle  . Physical activity:    Days per week: Not on file    Minutes per session: Not on file  . Stress: Not on file  Relationships  . Social connections:    Talks on phone: Not on file    Gets together: Not on file    Attends religious service: Not on file    Active member of club or organization: Not on file    Attends meetings of clubs or organizations: Not on file    Relationship status: Not on file  . Intimate partner violence:    Fear of current or ex partner: Not on file    Emotionally abused: Not on file    Physically abused: Not on file    Forced sexual activity: Not on file  Other Topics Concern  . Not on file  Social History Narrative   Patient is married with one child.   Patient is right handed.   Patient has hs education.   Patient drinks 2 cups daily.    FAMILY HISTORY: Family History  Problem Relation Age of Onset  . Stroke Father   . Heart attack Mother      ALLERGIES:  has No Known Allergies.  MEDICATIONS:  Current Outpatient Medications  Medication Sig Dispense Refill  . acetaminophen (TYLENOL) 500 MG tablet Take 500 mg by mouth every 6 (six) hours as needed for moderate pain (pain).     Marland Kitchen amLODipine (NORVASC) 2.5 MG tablet Take 2.5 mg by mouth daily.     . carboxymethylcellulose (REFRESH PLUS) 0.5 % SOLN Place 1 drop into both eyes as needed (dry eyes).    Marland Kitchen ELIQUIS 2.5 MG TABS tablet TAKE 1 TABLET BY MOUTH TWICE A DAY 180 tablet 3  . levothyroxine (SYNTHROID, LEVOTHROID) 25 MCG tablet Take 25 mcg by mouth daily. Per Physician take once weekly    . metoprolol (LOPRESSOR) 50 MG tablet Take 50 mg by mouth 2 (two) times daily.  0  . nitroGLYCERIN (NITROSTAT) 0.4 MG SL tablet Place 0.4 mg under the tongue every 5 (five) minutes as needed for chest pain.    . OnabotulinumtoxinA (BOTOX IJ) Inject as directed every 3 (three) months.    Marland Kitchen oxyCODONE-acetaminophen (PERCOCET/ROXICET) 5-325 MG tablet Take 1 tablet by mouth every 6 (six) hours as needed for moderate pain. 30 tablet 0  . pantoprazole (PROTONIX) 40 MG tablet Take 1 tablet (40 mg total) by mouth 2 (two) times daily. 30 tablet 0  . polyethylene glycol (MIRALAX / GLYCOLAX) packet Take 17 g by mouth 2 (two) times daily. 14 each 0  . senna (SENOKOT) 8.6 MG TABS tablet Take 2 tablets (17.2 mg total) by mouth daily. 30 each 1   No current facility-administered medications for this visit.     REVIEW OF SYSTEMS:    Constitutional: Denies fevers, chills or abnormal night sweats (+) insomnia  Eyes: Denies blurriness of vision, double vision or watery eyes (+) occasional eye spasms Ears, nose, mouth, throat, and face: Denies mucositis or sore throat (+) occasional throat spasms Respiratory: Denies cough or wheezes (+) SOB while laying down  Cardiovascular: Denies palpitation, chest discomfort or lower extremity swelling (+) abdominal pain  Gastrointestinal:  Denies nausea, heartburn or change  in bowel habits Skin: Denies abnormal skin rashes Lymphatics: Denies new lymphadenopathy or easy bruising Neurological:Denies numbness, tingling or new weaknesses Behavioral/Psych: Mood is stable, no new changes  All other systems were reviewed with the patient and are negative.  PHYSICAL  EXAMINATION: ECOG PERFORMANCE STATUS: 1 - Symptomatic but completely ambulatory  Vitals:   03/16/18 1415  BP: (!) 167/93  Pulse: 95  Resp: 18  Temp: 98.6 F (37 C)  SpO2: 99%   Filed Weights   03/16/18 1415  Weight: 125 lb 14.4 oz (57.1 kg)    GENERAL:alert, no distress and comfortable SKIN: skin color, texture, turgor are normal, no rashes or significant lesions  EYES: normal, conjunctiva are pink and non-injected, sclera clear OROPHARYNX:no exudate, no erythema and lips, buccal mucosa, and tongue normal  NECK: supple, thyroid normal size, non-tender, without nodularity LYMPH:  no palpable lymphadenopathy in the cervical, axillary or inguinal LUNGS: clear to auscultation and percussion with normal breathing effort HEART: regular rate and no murmurs and no lower extremity edema (+) Atrial fibrillation  ABDOMEN:abdomen soft, nondistended, normal bowel sounds (+) lower left quadrant tenderness, no rebound pain. Musculoskeletal:no cyanosis of digits and no clubbing  PSYCH: alert & oriented x 3 with fluent speech NEURO: no focal motor/sensory deficits  LABORATORY DATA:  I have reviewed the data as listed CBC Latest Ref Rng & Units 03/10/2018 03/09/2018 03/08/2018  WBC 4.0 - 10.5 K/uL 7.3 8.5 8.4  Hemoglobin 12.0 - 15.0 g/dL 10.4(L) 11.2(L) 10.5(L)  Hematocrit 36.0 - 46.0 % 33.3(L) 35.5(L) 33.9(L)  Platelets 150 - 400 K/uL 278 286 260    CMP Latest Ref Rng & Units 03/10/2018 03/09/2018 03/08/2018  Glucose 65 - 99 mg/dL 90 101(H) 94  BUN 6 - 20 mg/dL 12 10 9   Creatinine 0.44 - 1.00 mg/dL 1.26(H) 1.13(H) 1.10(H)  Sodium 135 - 145 mmol/L 139 139 140  Potassium 3.5 - 5.1 mmol/L 3.2(L) 3.1(L)  3.1(L)  Chloride 101 - 111 mmol/L 104 103 104  CO2 22 - 32 mmol/L 30 29 29   Calcium 8.9 - 10.3 mg/dL 8.6(L) 9.0 8.9  Total Protein 6.5 - 8.1 g/dL - - -  Total Bilirubin 0.3 - 1.2 mg/dL - - -  Alkaline Phos 38 - 126 U/L - - -  AST 15 - 41 U/L - - -  ALT 14 - 54 U/L - - -    PATHOLOGY  Diagnosis 03/08/18  Soft Tissue Needle Core Biopsy, Omental Nodule - METASTATIC ADENOCARCINOMA WITH SIGNET RING CELLS AND EXTRACELLULAR MUCIN, SEE COMMENT. Microscopic Comment Immunohistochemistry is positive for CDX-2, cytokeratin 20, and cytokeratin 7 (weak focal). PAX-8 is negative. The morphology and immunophenotype are consistent with a gastrointestinal (including appendiceal) or pancreaticobiliary primary. Dr. Lyndon Code has reviewed the case. The case was discussed with Dr. Gerarda Fraction on 03/10/2018.   PROCEDURES  Colonoscopy by Dr. Paulita Fujita on 03/07/18  IMPRESSION - Preparation of the colon was fair. - Hemorrhoids found on perianal exam. - Internal hemorrhoids. - Diverticulosis in the sigmoid colon, in the descending colon, in the transverse colon and in the ascending colon.   Upper Endoscopy by Dr. Paulita Fujita on 03/07/18  IMPRESSION  - Large hiatal hernia. - Normal stomach. - Normal duodenal bulb, first portion of the duodenum and second portion of the duodenum. - No source of abdominal pain or GI bleeding seen on endoscopy.  RADIOGRAPHIC STUDIES: I have personally reviewed the radiological images as listed and agreed with the findings in the report. US Pelvis Transvanginal Non-ob (tv Only)  Result Date: 03/04/2018 CLINICAL DATA:  Right-sided ovarian mass on CT. EXAM: TRANSABDOMINAL AND TRANSVAGINAL ULTRASOUND OF PELVIS TECHNIQUE: Both transabdominal and transvaginal ultrasound examinations of the pelvis were performed. Transabdominal technique was performed for global imaging of the pelvis including uterus, ovaries, adnexal regions,  and pelvic cul-de-sac. It was necessary to proceed with endovaginal  exam following the transabdominal exam to visualize the ovary/adnexa. COMPARISON:  CT of 1 day prior FINDINGS: Uterus Surgically absent Right ovary Measurements: 3.9 x 2.2 x 3.7 cm. Partially calcified right ovarian lesion of 2.3 x 1.9 x 1.4 cm, including on image 37. Just inferior to this, a partially cystic, partially solid hypoechoic lesion measures 1.6 x 1.2 x 0.9 cm. Left ovary Measurements: 4.9 x 4.4 x 4.2 cm. Primarily solid with central cystic component lesion measures 2.5 x 2.3 x 1.6 cm. Other findings Trace free pelvic fluid. There is also fluid adjacent the left ovary. IMPRESSION: 1. Bilateral ovarian lesions, including a calcified right-sided lesion and a dominant solid left-sided lesion. Suspicious for benign or malignant ovarian neoplasm(s). Consider gynecological consultation. If the patient is a surgical candidate, pre and postcontrast MRI may be informative. 2. Small volume cul-de-sac and left adnexal fluid. Electronically Signed   By: Abigail Miyamoto M.D.   On: 03/04/2018 15:21   US Pelvis (transabdominal Only)  Result Date: 03/04/2018 CLINICAL DATA:  Right-sided ovarian mass on CT. EXAM: TRANSABDOMINAL AND TRANSVAGINAL ULTRASOUND OF PELVIS TECHNIQUE: Both transabdominal and transvaginal ultrasound examinations of the pelvis were performed. Transabdominal technique was performed for global imaging of the pelvis including uterus, ovaries, adnexal regions, and pelvic cul-de-sac. It was necessary to proceed with endovaginal exam following the transabdominal exam to visualize the ovary/adnexa. COMPARISON:  CT of 1 day prior FINDINGS: Uterus Surgically absent Right ovary Measurements: 3.9 x 2.2 x 3.7 cm. Partially calcified right ovarian lesion of 2.3 x 1.9 x 1.4 cm, including on image 37. Just inferior to this, a partially cystic, partially solid hypoechoic lesion measures 1.6 x 1.2 x 0.9 cm. Left ovary Measurements: 4.9 x 4.4 x 4.2 cm. Primarily solid with central cystic component lesion measures 2.5  x 2.3 x 1.6 cm. Other findings Trace free pelvic fluid. There is also fluid adjacent the left ovary. IMPRESSION: 1. Bilateral ovarian lesions, including a calcified right-sided lesion and a dominant solid left-sided lesion. Suspicious for benign or malignant ovarian neoplasm(s). Consider gynecological consultation. If the patient is a surgical candidate, pre and postcontrast MRI may be informative. 2. Small volume cul-de-sac and left adnexal fluid. Electronically Signed   By: Abigail Miyamoto M.D.   On: 03/04/2018 15:21   Ct Abdomen Pelvis W Contrast  Result Date: 03/03/2018 CLINICAL DATA:  Abdominal pain, primarily on the right EXAM: CT ABDOMEN AND PELVIS WITH CONTRAST TECHNIQUE: Multidetector CT imaging of the abdomen and pelvis was performed using the standard protocol following bolus administration of intravenous contrast. Oral contrast was also administered. CONTRAST:  67mL OMNIPAQUE IOHEXOL 300 MG/ML  SOLN COMPARISON:  Abdominal ultrasound August 19, 2014 FINDINGS: Lower chest: There is bibasilar atelectasis. There are foci of coronary artery calcification. There is a sizable hiatal type hernia. Hepatobiliary: No focal liver lesions are evident on this noncontrast enhanced study. Gallbladder wall is not appreciably thickened. There is no biliary duct dilatation. Pancreas: There is no well-defined mass seen in the pancreas. No inflammatory focus evident. There are benign-appearing calcifications in the body of the pancreas which may represent residua of prior pancreatitis. No pancreatic duct dilatation evident. Spleen: No splenic lesions evident. Adrenals/Urinary Tract: Adrenals bilaterally appear normal. There is no renal mass on either side. There is decreased enhancement of the right kidney compared to the left kidney. There is severe hydronephrosis on the right. No hydronephrosis is evident on the left. There is no renal or ureteral  calculus on either side. There is diffuse dilatation of the left ureter to  its distal aspect. In the region of the distal left ureter, there is wall thickening with what appears to be a right adnexal mass either impressing upon or frankly invading the right distal ureter. Urinary bladder is midline with wall thickness within normal limits. Stomach/Bowel: There is no appreciable bowel wall or mesenteric thickening. No evident bowel obstruction. No free air or portal venous air. Vascular/Lymphatic: There is atherosclerotic calcification in the aorta and common iliac arteries. There is also common femoral artery atherosclerosis bilaterally. No aneurysm evident. Major mesenteric arterial vessels appear patent. There is no appreciable adenopathy in the abdomen or pelvis. Reproductive: Uterus absent. There is a complex mass in the right adnexal region containing calcification. This mass measures 5.0 x 4.9 x 4.2 cm. There is enlargement of the left ovary with mixed attenuation measuring 4.8 x 4.2 x 3.9 cm. Other: There is a mass in the right adnexal region immediately adjacent to the larger mass arising from the right ovary measuring 2.5 x 2.4 x 2.4 cm. This mass may well represent an omental lesion immediately adjacent to the right ovary which is either invading or impressing upon the distal right ureter. No similar lesions elsewhere. No abscess or ascites is present in the abdomen or pelvis. Appendix not seen. It is conceivable that the mass in the right adnexal region represents an appendiceal neoplasm as opposed to an ovarian neoplasm. Musculoskeletal: There are pars defects bilaterally at L5 with 7 mm of anterolisthesis of L5 on S1. There is degenerative change in the lumbar spine. There are no blastic or lytic bone lesions. There is no intramuscular or abdominal wall lesion. IMPRESSION: 1. There are complex masses in the right adnexal region. One of these masses either extends into or compresses the distal right ureter. The larger of these right adnexal masses measures 5.0 x 4.9 x 4.2 cm.  There is an adjacent mass measuring 2.5 x 2.4 x 2.4 cm. The appearance is concerning for ovarian neoplastic lesions on the right, possibly with an immediately adjacent omental lesion impressing upon or invading the right ureter. It should be noted that a normal appendix is not seen. A neoplastic lesion involving the appendix located in the right adnexal region must be considered a differential consideration. There is no inflammation in these areas. 2. Enlarged left ovary with inhomogeneous appearance. Concern for left ovarian neoplasm also present. 3. Severe hydronephrosis and ureterectasis on the right with decreased enhancement right kidney compared to the left consistent with the obstructing focus in the distal ureter region on the right. 4. No bowel obstruction. No abscess. No renal or ureteral calculi. No hydronephrosis. 5.  No adenopathy or liver lesions evident. 6. Aortoiliac atherosclerosis. Femoral artery atherosclerosis. There is coronary artery calcification as well. 7.  Pars defects at L5 bilaterally with spondylolisthesis at L5-S1. 8.  Uterus absent. 9.  Sizable hiatal type hernia. Comment: Pelvic MR nonemergently may be helpful for further delineation of the lesions in the right adnexal region. Aortic Atherosclerosis (ICD10-I70.0). Electronically Signed   By: Lowella Grip III M.D.   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 She presents for CT-guided biopsy of 1 of the omental nodules in the left upper quadrant. EXAM: CT BIOPSY MEDICATIONS: None. ANESTHESIA/SEDATION: Moderate (conscious) sedation was employed during this procedure. A total of Versed 1 mg and  Fentanyl 50 mcg was administered intravenously. Moderate Sedation Time: 11 minutes. The patient's level of consciousness and vital signs were monitored continuously by radiology nursing  throughout the procedure under my direct supervision. FLUOROSCOPY TIME:  Fluoroscopy Time: 0 minutes 0 seconds (0 mGy). COMPLICATIONS: None immediate. PROCEDURE: Informed written consent was obtained from the patient after a thorough discussion of the procedural risks, benefits and alternatives. All questions were addressed. Maximal Sterile Barrier Technique was utilized including caps, mask, sterile gowns, sterile gloves, sterile drape, hand hygiene and skin antiseptic. A timeout was performed prior to the initiation of the procedure. A planning axial CT scan was performed. Omental nodularity in the left upper quadrant was successfully identified. A suitable skin entry site was selected and marked. Following sterile prep and drape in the standard fashion with chlorhexidine skin prep, local anesthesia was attained by infiltration with 1% lidocaine. A small dermatotomy was made. Under intermittent CT guidance, a 17 gauge introducer needle was advanced into the margin of the omental nodule. Multiple 18 gauge core biopsies were then obtained. Biopsy specimens were placed in formalin and delivered to pathology for further analysis. Post biopsy CT imaging demonstrates no evidence of immediate complication. The patient tolerated the procedure well. IMPRESSION: Technically successful CT-guided core biopsy of left upper quadrant omental nodule. Electronically Signed   By: Jacqulynn Cadet M.D.   On: 03/08/2018 14:48    ASSESSMENT & PLAN:  [name] [name] is a 84 y.o. caucasian female with a history of CAD, Afib, HTN, Iron deficiency anemia, osteopenia, spasmodic dysphonia and Vit B12 deficiency.   1. 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  -Her peritoneal nodule biopsy showed metastatic adenocarcinoma with signet ring cells and extracellular mucin.  The IHC studies supportive GI primary.   Given the CT scan finding with significant masses in the right ovary/appendiceal region, this is consistent with primary appendiceal mucinous adenocarcinoma. -Her 03/07/18 EGD and colonoscopy showed no evidence of cancer although she did not have adequate prep done and the cecum and appendix was not able to be evaluated adequately. -Will get CT Chest to rule out distant metastasis in her chest -We discussed the natural history of appendiceal mucinous adenocarcinoma, which is relatively slow-growing tumor, compared to colon cancer, the overall prognosis is relatively better.  The median overall survival for metastatic appendiceal adenocarcinoma with signet ring cells is about 2 years, and about 25% patients will liver over 5 years.  The most complication with peritoneal metastatic disease is bowel obstruction, we reviewed the symptoms of bowel obstruction. -We discussed the treatment options.  Unfortunately it is unlikely curable, the treatment options including debulking surgery, HIPEC, and chemotherapy.  I discussed the role of systemic chemotherapy, such as FOLFOX, or Xeloda, in the neoadjuvant setting before debulking surgery, or as palliative option for for disease control If surgery is not an option. -Patient has been referred to see Dr. Clovis Riley at [facility] to discuss possible surgery, her appointment is next Friday. -Depend her surgical option, I will discuss with Dr. Clovis Riley about neoadjuvant chemo.  -Given her advanced age, medical comorbidities, especially previous CABG and stroke, she may not be a great candidate for extensive surgery and intensive chemotherapy. -I discussed without surgical debulking, the goal of therapy is palliative to control her disease and will likely be on treatment longterm for as long as she can tolerate. PAC placement and chemo education class would be done before proceeding  with chemo.  -I do not recommend radiation given the extent of her disease, unless needed for palliative  measures.  -Pt is overall interested in proceeding with available treatments.  -I briefly reviewed cancer surveillance with her -F/u open    2. Right hydronephrosis, secondary to peritoneal metastasis -Her 03/03/18 scan also shows severe right-sided hydronephrosis, likely secondary to peritoneal metastasis. She has been seen by urologist Dr. Tresa Moore in the hospital who does not recommend stent placement at this time.   -will f/u her renal function closely.  If gets worse, will refer her back to see Dr. Tresa Moore    3. Constipation  -secondary to pain medication, currently on Oxycodone q6hours PRN -She can continue Senokot. I also recommend she use Miralax which she can titrate up as needed. She is fine to use the generic medication.   4. CAD, Afib -She is followed by cardiologist Dr. Burt Knack -I explained if she proceeds with 5-Fu pump she will need to have her heart function followed closely.   5. Insomnia  -I recommend she try OTC melatonin or Benadryl before I prescribe sleep aids. She is agreeable.    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 Clovis Riley at [facility] next Friday, I will discuss with Dr. Clovis Riley about her treatment plan  -F/u open for now    All questions were answered. The patient knows to call the clinic with any problems, questions or concerns. I spent 55 minutes counseling the patient face to face. The total time spent in the appointment was 60 minutes and more than 50% was on counseling.     Truitt Merle, MD 03/16/2018 7:46 PM  I, Joslyn Devon, am acting as scribe for Truitt Merle, MD.   I have reviewed the above documentation for accuracy and completeness, and I agree with the above.

## 2018-03-16 ENCOUNTER — Inpatient Hospital Stay: Payer: Medicare Other | Attending: Hematology | Admitting: Hematology

## 2018-03-16 ENCOUNTER — Telehealth: Payer: Self-pay | Admitting: Hematology

## 2018-03-16 ENCOUNTER — Encounter: Payer: Self-pay | Admitting: Hematology

## 2018-03-16 DIAGNOSIS — Z7901 Long term (current) use of anticoagulants: Secondary | ICD-10-CM | POA: Diagnosis not present

## 2018-03-16 DIAGNOSIS — C786 Secondary malignant neoplasm of retroperitoneum and peritoneum: Secondary | ICD-10-CM | POA: Diagnosis not present

## 2018-03-16 DIAGNOSIS — G4700 Insomnia, unspecified: Secondary | ICD-10-CM | POA: Insufficient documentation

## 2018-03-16 DIAGNOSIS — K5903 Drug induced constipation: Secondary | ICD-10-CM | POA: Insufficient documentation

## 2018-03-16 DIAGNOSIS — D509 Iron deficiency anemia, unspecified: Secondary | ICD-10-CM | POA: Diagnosis not present

## 2018-03-16 DIAGNOSIS — C181 Malignant neoplasm of appendix: Secondary | ICD-10-CM | POA: Diagnosis not present

## 2018-03-16 DIAGNOSIS — I10 Essential (primary) hypertension: Secondary | ICD-10-CM | POA: Diagnosis not present

## 2018-03-16 DIAGNOSIS — Z87891 Personal history of nicotine dependence: Secondary | ICD-10-CM | POA: Insufficient documentation

## 2018-03-16 DIAGNOSIS — N1330 Unspecified hydronephrosis: Secondary | ICD-10-CM | POA: Diagnosis not present

## 2018-03-16 DIAGNOSIS — M8580 Other specified disorders of bone density and structure, unspecified site: Secondary | ICD-10-CM | POA: Diagnosis not present

## 2018-03-16 DIAGNOSIS — R490 Dysphonia: Secondary | ICD-10-CM | POA: Diagnosis not present

## 2018-03-16 DIAGNOSIS — I2510 Atherosclerotic heart disease of native coronary artery without angina pectoris: Secondary | ICD-10-CM | POA: Insufficient documentation

## 2018-03-16 DIAGNOSIS — E538 Deficiency of other specified B group vitamins: Secondary | ICD-10-CM | POA: Insufficient documentation

## 2018-03-16 DIAGNOSIS — Z8673 Personal history of transient ischemic attack (TIA), and cerebral infarction without residual deficits: Secondary | ICD-10-CM | POA: Insufficient documentation

## 2018-03-16 DIAGNOSIS — I4891 Unspecified atrial fibrillation: Secondary | ICD-10-CM | POA: Diagnosis not present

## 2018-03-16 NOTE — Telephone Encounter (Signed)
F/U open/ avs/calendar printed per 6/6 los

## 2018-03-20 DIAGNOSIS — C786 Secondary malignant neoplasm of retroperitoneum and peritoneum: Secondary | ICD-10-CM | POA: Diagnosis not present

## 2018-03-22 DIAGNOSIS — R109 Unspecified abdominal pain: Secondary | ICD-10-CM | POA: Diagnosis not present

## 2018-03-22 DIAGNOSIS — K219 Gastro-esophageal reflux disease without esophagitis: Secondary | ICD-10-CM | POA: Diagnosis not present

## 2018-03-22 DIAGNOSIS — K5900 Constipation, unspecified: Secondary | ICD-10-CM | POA: Diagnosis not present

## 2018-03-22 DIAGNOSIS — I10 Essential (primary) hypertension: Secondary | ICD-10-CM | POA: Diagnosis not present

## 2018-03-24 DIAGNOSIS — Z955 Presence of coronary angioplasty implant and graft: Secondary | ICD-10-CM | POA: Diagnosis not present

## 2018-03-24 DIAGNOSIS — C786 Secondary malignant neoplasm of retroperitoneum and peritoneum: Secondary | ICD-10-CM | POA: Diagnosis not present

## 2018-03-24 DIAGNOSIS — C181 Malignant neoplasm of appendix: Secondary | ICD-10-CM | POA: Diagnosis not present

## 2018-03-24 DIAGNOSIS — R978 Other abnormal tumor markers: Secondary | ICD-10-CM | POA: Diagnosis not present

## 2018-03-24 DIAGNOSIS — J449 Chronic obstructive pulmonary disease, unspecified: Secondary | ICD-10-CM | POA: Diagnosis not present

## 2018-03-24 DIAGNOSIS — Z8673 Personal history of transient ischemic attack (TIA), and cerebral infarction without residual deficits: Secondary | ICD-10-CM | POA: Diagnosis not present

## 2018-03-24 DIAGNOSIS — C7880 Secondary malignant neoplasm of unspecified digestive organ: Secondary | ICD-10-CM | POA: Diagnosis not present

## 2018-03-24 DIAGNOSIS — C799 Secondary malignant neoplasm of unspecified site: Secondary | ICD-10-CM | POA: Diagnosis not present

## 2018-04-04 ENCOUNTER — Telehealth: Payer: Self-pay

## 2018-04-04 NOTE — Telephone Encounter (Signed)
Patient calls stating she was referred to Dr. Clovis Riley for surgical consult and he has elected not to do surgery and has referred her back to Dr. Burr Medico.    Requesting appointment.

## 2018-04-06 ENCOUNTER — Telehealth: Payer: Self-pay

## 2018-04-06 ENCOUNTER — Other Ambulatory Visit: Payer: Self-pay | Admitting: Hematology

## 2018-04-06 ENCOUNTER — Telehealth: Payer: Self-pay | Admitting: Hematology

## 2018-04-06 DIAGNOSIS — C181 Malignant neoplasm of appendix: Secondary | ICD-10-CM

## 2018-04-06 NOTE — Telephone Encounter (Signed)
I spoke with pt's daughter sherry today, and encouraged her to bring her mother to see me next week, to discuss supportive care, vs low intensity chemo such as Xeloda. She agrees.   Truitt Merle  04/06/2018

## 2018-04-06 NOTE — Telephone Encounter (Signed)
Spoke with patient's daughter Judeen Hammans  per Dr. Burr Medico, Dr. Georgina Snell recommendation is chemo prior to surgery.    Her daughter states her mother is so weak she sleeps all the time.  They are discussed chemo amongst themselves including the patient and they really do not feel she could tolerate it.  Judeen Hammans would like to know if Dr. Burr Medico has any other recommendations?  Judeen Hammans [phone]

## 2018-04-06 NOTE — Telephone Encounter (Signed)
Appointments scheduled and patient's daughter notified per 6/27 sch msg

## 2018-04-11 ENCOUNTER — Inpatient Hospital Stay: Payer: Medicare Other | Admitting: Hematology

## 2018-04-11 ENCOUNTER — Other Ambulatory Visit: Payer: 59

## 2018-04-11 ENCOUNTER — Ambulatory Visit: Payer: 59 | Admitting: Hematology

## 2018-04-11 ENCOUNTER — Telehealth: Payer: Self-pay

## 2018-04-11 ENCOUNTER — Inpatient Hospital Stay: Payer: Medicare Other

## 2018-04-11 ENCOUNTER — Telehealth: Payer: Self-pay | Admitting: Hematology

## 2018-04-11 NOTE — Telephone Encounter (Signed)
Per Md patient was to sick to come

## 2018-04-11 NOTE — Telephone Encounter (Signed)
Patients daughter called stating hat her mom has an appointment with Dr. Burr Medico this afternoon but cannot come she feels too bad to come for visit.

## 2018-04-12 ENCOUNTER — Telehealth: Payer: Self-pay

## 2018-04-12 NOTE — Telephone Encounter (Signed)
Called patient's daughter Judeen Hammans per Dr. Burr Medico offered Hospice or anything we can do.  Per her daughter today her mom is acting fine.  Would like to try to get her in one more time to see Dr. Burr Medico, sent scheduling message for 7/10 at 3:00

## 2018-04-18 DIAGNOSIS — G248 Other dystonia: Secondary | ICD-10-CM | POA: Diagnosis not present

## 2018-04-18 DIAGNOSIS — I679 Cerebrovascular disease, unspecified: Secondary | ICD-10-CM | POA: Diagnosis not present

## 2018-04-18 DIAGNOSIS — R630 Anorexia: Secondary | ICD-10-CM | POA: Diagnosis not present

## 2018-04-18 DIAGNOSIS — I4891 Unspecified atrial fibrillation: Secondary | ICD-10-CM | POA: Diagnosis not present

## 2018-04-18 DIAGNOSIS — C786 Secondary malignant neoplasm of retroperitoneum and peritoneum: Secondary | ICD-10-CM | POA: Diagnosis not present

## 2018-04-18 DIAGNOSIS — R1084 Generalized abdominal pain: Secondary | ICD-10-CM | POA: Diagnosis not present

## 2018-04-18 DIAGNOSIS — I25119 Atherosclerotic heart disease of native coronary artery with unspecified angina pectoris: Secondary | ICD-10-CM | POA: Diagnosis not present

## 2018-04-18 DIAGNOSIS — C181 Malignant neoplasm of appendix: Secondary | ICD-10-CM | POA: Diagnosis not present

## 2018-04-18 DIAGNOSIS — K219 Gastro-esophageal reflux disease without esophagitis: Secondary | ICD-10-CM | POA: Diagnosis not present

## 2018-04-18 DIAGNOSIS — D500 Iron deficiency anemia secondary to blood loss (chronic): Secondary | ICD-10-CM | POA: Diagnosis not present

## 2018-04-18 DIAGNOSIS — R110 Nausea: Secondary | ICD-10-CM | POA: Diagnosis not present

## 2018-04-18 DIAGNOSIS — N183 Chronic kidney disease, stage 3 (moderate): Secondary | ICD-10-CM | POA: Diagnosis not present

## 2018-04-18 DIAGNOSIS — E785 Hyperlipidemia, unspecified: Secondary | ICD-10-CM | POA: Diagnosis not present

## 2018-04-18 DIAGNOSIS — I10 Essential (primary) hypertension: Secondary | ICD-10-CM | POA: Diagnosis not present

## 2018-04-18 DIAGNOSIS — E039 Hypothyroidism, unspecified: Secondary | ICD-10-CM | POA: Diagnosis not present

## 2018-04-18 DIAGNOSIS — F419 Anxiety disorder, unspecified: Secondary | ICD-10-CM | POA: Diagnosis not present

## 2018-04-19 ENCOUNTER — Inpatient Hospital Stay: Payer: Medicare Other | Admitting: Hematology

## 2018-04-19 ENCOUNTER — Inpatient Hospital Stay: Payer: Medicare Other

## 2018-04-19 DIAGNOSIS — D500 Iron deficiency anemia secondary to blood loss (chronic): Secondary | ICD-10-CM | POA: Diagnosis not present

## 2018-04-19 DIAGNOSIS — R1084 Generalized abdominal pain: Secondary | ICD-10-CM | POA: Diagnosis not present

## 2018-04-19 DIAGNOSIS — C786 Secondary malignant neoplasm of retroperitoneum and peritoneum: Secondary | ICD-10-CM | POA: Diagnosis not present

## 2018-04-19 DIAGNOSIS — N183 Chronic kidney disease, stage 3 (moderate): Secondary | ICD-10-CM | POA: Diagnosis not present

## 2018-04-19 DIAGNOSIS — R630 Anorexia: Secondary | ICD-10-CM | POA: Diagnosis not present

## 2018-04-19 DIAGNOSIS — C181 Malignant neoplasm of appendix: Secondary | ICD-10-CM | POA: Diagnosis not present

## 2018-04-20 DIAGNOSIS — C181 Malignant neoplasm of appendix: Secondary | ICD-10-CM | POA: Diagnosis not present

## 2018-04-20 DIAGNOSIS — C786 Secondary malignant neoplasm of retroperitoneum and peritoneum: Secondary | ICD-10-CM | POA: Diagnosis not present

## 2018-04-20 DIAGNOSIS — R1084 Generalized abdominal pain: Secondary | ICD-10-CM | POA: Diagnosis not present

## 2018-04-20 DIAGNOSIS — R630 Anorexia: Secondary | ICD-10-CM | POA: Diagnosis not present

## 2018-04-20 DIAGNOSIS — N183 Chronic kidney disease, stage 3 (moderate): Secondary | ICD-10-CM | POA: Diagnosis not present

## 2018-04-20 DIAGNOSIS — D500 Iron deficiency anemia secondary to blood loss (chronic): Secondary | ICD-10-CM | POA: Diagnosis not present

## 2018-04-21 DIAGNOSIS — D500 Iron deficiency anemia secondary to blood loss (chronic): Secondary | ICD-10-CM | POA: Diagnosis not present

## 2018-04-21 DIAGNOSIS — R630 Anorexia: Secondary | ICD-10-CM | POA: Diagnosis not present

## 2018-04-21 DIAGNOSIS — C181 Malignant neoplasm of appendix: Secondary | ICD-10-CM | POA: Diagnosis not present

## 2018-04-21 DIAGNOSIS — C786 Secondary malignant neoplasm of retroperitoneum and peritoneum: Secondary | ICD-10-CM | POA: Diagnosis not present

## 2018-04-21 DIAGNOSIS — N183 Chronic kidney disease, stage 3 (moderate): Secondary | ICD-10-CM | POA: Diagnosis not present

## 2018-04-21 DIAGNOSIS — R1084 Generalized abdominal pain: Secondary | ICD-10-CM | POA: Diagnosis not present

## 2018-04-24 DIAGNOSIS — N183 Chronic kidney disease, stage 3 (moderate): Secondary | ICD-10-CM | POA: Diagnosis not present

## 2018-04-24 DIAGNOSIS — R630 Anorexia: Secondary | ICD-10-CM | POA: Diagnosis not present

## 2018-04-24 DIAGNOSIS — D500 Iron deficiency anemia secondary to blood loss (chronic): Secondary | ICD-10-CM | POA: Diagnosis not present

## 2018-04-24 DIAGNOSIS — C786 Secondary malignant neoplasm of retroperitoneum and peritoneum: Secondary | ICD-10-CM | POA: Diagnosis not present

## 2018-04-24 DIAGNOSIS — R1084 Generalized abdominal pain: Secondary | ICD-10-CM | POA: Diagnosis not present

## 2018-04-24 DIAGNOSIS — C181 Malignant neoplasm of appendix: Secondary | ICD-10-CM | POA: Diagnosis not present

## 2018-04-27 DIAGNOSIS — R1084 Generalized abdominal pain: Secondary | ICD-10-CM | POA: Diagnosis not present

## 2018-04-27 DIAGNOSIS — R630 Anorexia: Secondary | ICD-10-CM | POA: Diagnosis not present

## 2018-04-27 DIAGNOSIS — N183 Chronic kidney disease, stage 3 (moderate): Secondary | ICD-10-CM | POA: Diagnosis not present

## 2018-04-27 DIAGNOSIS — C786 Secondary malignant neoplasm of retroperitoneum and peritoneum: Secondary | ICD-10-CM | POA: Diagnosis not present

## 2018-04-27 DIAGNOSIS — D500 Iron deficiency anemia secondary to blood loss (chronic): Secondary | ICD-10-CM | POA: Diagnosis not present

## 2018-04-27 DIAGNOSIS — C181 Malignant neoplasm of appendix: Secondary | ICD-10-CM | POA: Diagnosis not present

## 2018-05-01 DIAGNOSIS — C181 Malignant neoplasm of appendix: Secondary | ICD-10-CM | POA: Diagnosis not present

## 2018-05-01 DIAGNOSIS — R1084 Generalized abdominal pain: Secondary | ICD-10-CM | POA: Diagnosis not present

## 2018-05-01 DIAGNOSIS — N183 Chronic kidney disease, stage 3 (moderate): Secondary | ICD-10-CM | POA: Diagnosis not present

## 2018-05-01 DIAGNOSIS — R630 Anorexia: Secondary | ICD-10-CM | POA: Diagnosis not present

## 2018-05-01 DIAGNOSIS — D500 Iron deficiency anemia secondary to blood loss (chronic): Secondary | ICD-10-CM | POA: Diagnosis not present

## 2018-05-01 DIAGNOSIS — C786 Secondary malignant neoplasm of retroperitoneum and peritoneum: Secondary | ICD-10-CM | POA: Diagnosis not present

## 2018-05-02 DIAGNOSIS — R1084 Generalized abdominal pain: Secondary | ICD-10-CM | POA: Diagnosis not present

## 2018-05-02 DIAGNOSIS — N183 Chronic kidney disease, stage 3 (moderate): Secondary | ICD-10-CM | POA: Diagnosis not present

## 2018-05-02 DIAGNOSIS — C181 Malignant neoplasm of appendix: Secondary | ICD-10-CM | POA: Diagnosis not present

## 2018-05-02 DIAGNOSIS — D500 Iron deficiency anemia secondary to blood loss (chronic): Secondary | ICD-10-CM | POA: Diagnosis not present

## 2018-05-02 DIAGNOSIS — R630 Anorexia: Secondary | ICD-10-CM | POA: Diagnosis not present

## 2018-05-02 DIAGNOSIS — C786 Secondary malignant neoplasm of retroperitoneum and peritoneum: Secondary | ICD-10-CM | POA: Diagnosis not present

## 2018-05-04 DIAGNOSIS — D500 Iron deficiency anemia secondary to blood loss (chronic): Secondary | ICD-10-CM | POA: Diagnosis not present

## 2018-05-04 DIAGNOSIS — R1084 Generalized abdominal pain: Secondary | ICD-10-CM | POA: Diagnosis not present

## 2018-05-04 DIAGNOSIS — C786 Secondary malignant neoplasm of retroperitoneum and peritoneum: Secondary | ICD-10-CM | POA: Diagnosis not present

## 2018-05-04 DIAGNOSIS — C181 Malignant neoplasm of appendix: Secondary | ICD-10-CM | POA: Diagnosis not present

## 2018-05-04 DIAGNOSIS — R630 Anorexia: Secondary | ICD-10-CM | POA: Diagnosis not present

## 2018-05-04 DIAGNOSIS — N183 Chronic kidney disease, stage 3 (moderate): Secondary | ICD-10-CM | POA: Diagnosis not present

## 2018-05-05 DIAGNOSIS — D500 Iron deficiency anemia secondary to blood loss (chronic): Secondary | ICD-10-CM | POA: Diagnosis not present

## 2018-05-05 DIAGNOSIS — N183 Chronic kidney disease, stage 3 (moderate): Secondary | ICD-10-CM | POA: Diagnosis not present

## 2018-05-05 DIAGNOSIS — R630 Anorexia: Secondary | ICD-10-CM | POA: Diagnosis not present

## 2018-05-05 DIAGNOSIS — R1084 Generalized abdominal pain: Secondary | ICD-10-CM | POA: Diagnosis not present

## 2018-05-05 DIAGNOSIS — C181 Malignant neoplasm of appendix: Secondary | ICD-10-CM | POA: Diagnosis not present

## 2018-05-05 DIAGNOSIS — C786 Secondary malignant neoplasm of retroperitoneum and peritoneum: Secondary | ICD-10-CM | POA: Diagnosis not present

## 2018-05-07 DIAGNOSIS — N183 Chronic kidney disease, stage 3 (moderate): Secondary | ICD-10-CM | POA: Diagnosis not present

## 2018-05-07 DIAGNOSIS — R1084 Generalized abdominal pain: Secondary | ICD-10-CM | POA: Diagnosis not present

## 2018-05-07 DIAGNOSIS — R630 Anorexia: Secondary | ICD-10-CM | POA: Diagnosis not present

## 2018-05-07 DIAGNOSIS — D500 Iron deficiency anemia secondary to blood loss (chronic): Secondary | ICD-10-CM | POA: Diagnosis not present

## 2018-05-07 DIAGNOSIS — C181 Malignant neoplasm of appendix: Secondary | ICD-10-CM | POA: Diagnosis not present

## 2018-05-07 DIAGNOSIS — C786 Secondary malignant neoplasm of retroperitoneum and peritoneum: Secondary | ICD-10-CM | POA: Diagnosis not present

## 2018-05-09 DIAGNOSIS — R1084 Generalized abdominal pain: Secondary | ICD-10-CM | POA: Diagnosis not present

## 2018-05-09 DIAGNOSIS — C181 Malignant neoplasm of appendix: Secondary | ICD-10-CM | POA: Diagnosis not present

## 2018-05-09 DIAGNOSIS — R630 Anorexia: Secondary | ICD-10-CM | POA: Diagnosis not present

## 2018-05-09 DIAGNOSIS — N183 Chronic kidney disease, stage 3 (moderate): Secondary | ICD-10-CM | POA: Diagnosis not present

## 2018-05-09 DIAGNOSIS — C786 Secondary malignant neoplasm of retroperitoneum and peritoneum: Secondary | ICD-10-CM | POA: Diagnosis not present

## 2018-05-09 DIAGNOSIS — D500 Iron deficiency anemia secondary to blood loss (chronic): Secondary | ICD-10-CM | POA: Diagnosis not present

## 2018-05-11 DIAGNOSIS — E039 Hypothyroidism, unspecified: Secondary | ICD-10-CM | POA: Diagnosis not present

## 2018-05-11 DIAGNOSIS — I10 Essential (primary) hypertension: Secondary | ICD-10-CM | POA: Diagnosis not present

## 2018-05-11 DIAGNOSIS — K219 Gastro-esophageal reflux disease without esophagitis: Secondary | ICD-10-CM | POA: Diagnosis not present

## 2018-05-11 DIAGNOSIS — R110 Nausea: Secondary | ICD-10-CM | POA: Diagnosis not present

## 2018-05-11 DIAGNOSIS — I679 Cerebrovascular disease, unspecified: Secondary | ICD-10-CM | POA: Diagnosis not present

## 2018-05-11 DIAGNOSIS — I25119 Atherosclerotic heart disease of native coronary artery with unspecified angina pectoris: Secondary | ICD-10-CM | POA: Diagnosis not present

## 2018-05-11 DIAGNOSIS — N183 Chronic kidney disease, stage 3 (moderate): Secondary | ICD-10-CM | POA: Diagnosis not present

## 2018-05-11 DIAGNOSIS — E785 Hyperlipidemia, unspecified: Secondary | ICD-10-CM | POA: Diagnosis not present

## 2018-05-11 DIAGNOSIS — I4891 Unspecified atrial fibrillation: Secondary | ICD-10-CM | POA: Diagnosis not present

## 2018-05-11 DIAGNOSIS — D500 Iron deficiency anemia secondary to blood loss (chronic): Secondary | ICD-10-CM | POA: Diagnosis not present

## 2018-05-11 DIAGNOSIS — C786 Secondary malignant neoplasm of retroperitoneum and peritoneum: Secondary | ICD-10-CM | POA: Diagnosis not present

## 2018-05-11 DIAGNOSIS — R1084 Generalized abdominal pain: Secondary | ICD-10-CM | POA: Diagnosis not present

## 2018-05-11 DIAGNOSIS — G248 Other dystonia: Secondary | ICD-10-CM | POA: Diagnosis not present

## 2018-05-11 DIAGNOSIS — F419 Anxiety disorder, unspecified: Secondary | ICD-10-CM | POA: Diagnosis not present

## 2018-05-11 DIAGNOSIS — R630 Anorexia: Secondary | ICD-10-CM | POA: Diagnosis not present

## 2018-05-11 DIAGNOSIS — C181 Malignant neoplasm of appendix: Secondary | ICD-10-CM | POA: Diagnosis not present

## 2018-05-12 DIAGNOSIS — C181 Malignant neoplasm of appendix: Secondary | ICD-10-CM | POA: Diagnosis not present

## 2018-05-12 DIAGNOSIS — R630 Anorexia: Secondary | ICD-10-CM | POA: Diagnosis not present

## 2018-05-12 DIAGNOSIS — N183 Chronic kidney disease, stage 3 (moderate): Secondary | ICD-10-CM | POA: Diagnosis not present

## 2018-05-12 DIAGNOSIS — D500 Iron deficiency anemia secondary to blood loss (chronic): Secondary | ICD-10-CM | POA: Diagnosis not present

## 2018-05-12 DIAGNOSIS — C786 Secondary malignant neoplasm of retroperitoneum and peritoneum: Secondary | ICD-10-CM | POA: Diagnosis not present

## 2018-05-12 DIAGNOSIS — R1084 Generalized abdominal pain: Secondary | ICD-10-CM | POA: Diagnosis not present

## 2018-05-14 DIAGNOSIS — D500 Iron deficiency anemia secondary to blood loss (chronic): Secondary | ICD-10-CM | POA: Diagnosis not present

## 2018-05-14 DIAGNOSIS — R1084 Generalized abdominal pain: Secondary | ICD-10-CM | POA: Diagnosis not present

## 2018-05-14 DIAGNOSIS — N183 Chronic kidney disease, stage 3 (moderate): Secondary | ICD-10-CM | POA: Diagnosis not present

## 2018-05-14 DIAGNOSIS — R630 Anorexia: Secondary | ICD-10-CM | POA: Diagnosis not present

## 2018-05-14 DIAGNOSIS — C181 Malignant neoplasm of appendix: Secondary | ICD-10-CM | POA: Diagnosis not present

## 2018-05-14 DIAGNOSIS — C786 Secondary malignant neoplasm of retroperitoneum and peritoneum: Secondary | ICD-10-CM | POA: Diagnosis not present

## 2018-05-15 DIAGNOSIS — R630 Anorexia: Secondary | ICD-10-CM | POA: Diagnosis not present

## 2018-05-15 DIAGNOSIS — C181 Malignant neoplasm of appendix: Secondary | ICD-10-CM | POA: Diagnosis not present

## 2018-05-15 DIAGNOSIS — D500 Iron deficiency anemia secondary to blood loss (chronic): Secondary | ICD-10-CM | POA: Diagnosis not present

## 2018-05-15 DIAGNOSIS — C786 Secondary malignant neoplasm of retroperitoneum and peritoneum: Secondary | ICD-10-CM | POA: Diagnosis not present

## 2018-05-15 DIAGNOSIS — N183 Chronic kidney disease, stage 3 (moderate): Secondary | ICD-10-CM | POA: Diagnosis not present

## 2018-05-15 DIAGNOSIS — R1084 Generalized abdominal pain: Secondary | ICD-10-CM | POA: Diagnosis not present

## 2018-05-16 DIAGNOSIS — C786 Secondary malignant neoplasm of retroperitoneum and peritoneum: Secondary | ICD-10-CM | POA: Diagnosis not present

## 2018-05-16 DIAGNOSIS — R1084 Generalized abdominal pain: Secondary | ICD-10-CM | POA: Diagnosis not present

## 2018-05-16 DIAGNOSIS — N183 Chronic kidney disease, stage 3 (moderate): Secondary | ICD-10-CM | POA: Diagnosis not present

## 2018-05-16 DIAGNOSIS — C181 Malignant neoplasm of appendix: Secondary | ICD-10-CM | POA: Diagnosis not present

## 2018-05-16 DIAGNOSIS — D500 Iron deficiency anemia secondary to blood loss (chronic): Secondary | ICD-10-CM | POA: Diagnosis not present

## 2018-05-16 DIAGNOSIS — R630 Anorexia: Secondary | ICD-10-CM | POA: Diagnosis not present

## 2018-05-17 DIAGNOSIS — N183 Chronic kidney disease, stage 3 (moderate): Secondary | ICD-10-CM | POA: Diagnosis not present

## 2018-05-17 DIAGNOSIS — R630 Anorexia: Secondary | ICD-10-CM | POA: Diagnosis not present

## 2018-05-17 DIAGNOSIS — R1084 Generalized abdominal pain: Secondary | ICD-10-CM | POA: Diagnosis not present

## 2018-05-17 DIAGNOSIS — C181 Malignant neoplasm of appendix: Secondary | ICD-10-CM | POA: Diagnosis not present

## 2018-05-17 DIAGNOSIS — C786 Secondary malignant neoplasm of retroperitoneum and peritoneum: Secondary | ICD-10-CM | POA: Diagnosis not present

## 2018-05-17 DIAGNOSIS — D500 Iron deficiency anemia secondary to blood loss (chronic): Secondary | ICD-10-CM | POA: Diagnosis not present

## 2018-05-18 DIAGNOSIS — R1084 Generalized abdominal pain: Secondary | ICD-10-CM | POA: Diagnosis not present

## 2018-05-18 DIAGNOSIS — C786 Secondary malignant neoplasm of retroperitoneum and peritoneum: Secondary | ICD-10-CM | POA: Diagnosis not present

## 2018-05-18 DIAGNOSIS — D500 Iron deficiency anemia secondary to blood loss (chronic): Secondary | ICD-10-CM | POA: Diagnosis not present

## 2018-05-18 DIAGNOSIS — R630 Anorexia: Secondary | ICD-10-CM | POA: Diagnosis not present

## 2018-05-18 DIAGNOSIS — C181 Malignant neoplasm of appendix: Secondary | ICD-10-CM | POA: Diagnosis not present

## 2018-05-18 DIAGNOSIS — N183 Chronic kidney disease, stage 3 (moderate): Secondary | ICD-10-CM | POA: Diagnosis not present

## 2018-05-19 DIAGNOSIS — R630 Anorexia: Secondary | ICD-10-CM | POA: Diagnosis not present

## 2018-05-19 DIAGNOSIS — R1084 Generalized abdominal pain: Secondary | ICD-10-CM | POA: Diagnosis not present

## 2018-05-19 DIAGNOSIS — C181 Malignant neoplasm of appendix: Secondary | ICD-10-CM | POA: Diagnosis not present

## 2018-05-19 DIAGNOSIS — D500 Iron deficiency anemia secondary to blood loss (chronic): Secondary | ICD-10-CM | POA: Diagnosis not present

## 2018-05-19 DIAGNOSIS — N183 Chronic kidney disease, stage 3 (moderate): Secondary | ICD-10-CM | POA: Diagnosis not present

## 2018-05-19 DIAGNOSIS — C786 Secondary malignant neoplasm of retroperitoneum and peritoneum: Secondary | ICD-10-CM | POA: Diagnosis not present

## 2018-05-20 DIAGNOSIS — N183 Chronic kidney disease, stage 3 (moderate): Secondary | ICD-10-CM | POA: Diagnosis not present

## 2018-05-20 DIAGNOSIS — C786 Secondary malignant neoplasm of retroperitoneum and peritoneum: Secondary | ICD-10-CM | POA: Diagnosis not present

## 2018-05-20 DIAGNOSIS — D500 Iron deficiency anemia secondary to blood loss (chronic): Secondary | ICD-10-CM | POA: Diagnosis not present

## 2018-05-20 DIAGNOSIS — C181 Malignant neoplasm of appendix: Secondary | ICD-10-CM | POA: Diagnosis not present

## 2018-05-20 DIAGNOSIS — R1084 Generalized abdominal pain: Secondary | ICD-10-CM | POA: Diagnosis not present

## 2018-05-20 DIAGNOSIS — R630 Anorexia: Secondary | ICD-10-CM | POA: Diagnosis not present

## 2018-05-21 DIAGNOSIS — R630 Anorexia: Secondary | ICD-10-CM | POA: Diagnosis not present

## 2018-05-21 DIAGNOSIS — D500 Iron deficiency anemia secondary to blood loss (chronic): Secondary | ICD-10-CM | POA: Diagnosis not present

## 2018-05-21 DIAGNOSIS — C181 Malignant neoplasm of appendix: Secondary | ICD-10-CM | POA: Diagnosis not present

## 2018-05-21 DIAGNOSIS — C786 Secondary malignant neoplasm of retroperitoneum and peritoneum: Secondary | ICD-10-CM | POA: Diagnosis not present

## 2018-05-21 DIAGNOSIS — R1084 Generalized abdominal pain: Secondary | ICD-10-CM | POA: Diagnosis not present

## 2018-05-21 DIAGNOSIS — N183 Chronic kidney disease, stage 3 (moderate): Secondary | ICD-10-CM | POA: Diagnosis not present

## 2018-05-22 DIAGNOSIS — N183 Chronic kidney disease, stage 3 (moderate): Secondary | ICD-10-CM | POA: Diagnosis not present

## 2018-05-22 DIAGNOSIS — R630 Anorexia: Secondary | ICD-10-CM | POA: Diagnosis not present

## 2018-05-22 DIAGNOSIS — C181 Malignant neoplasm of appendix: Secondary | ICD-10-CM | POA: Diagnosis not present

## 2018-05-22 DIAGNOSIS — D500 Iron deficiency anemia secondary to blood loss (chronic): Secondary | ICD-10-CM | POA: Diagnosis not present

## 2018-05-22 DIAGNOSIS — R1084 Generalized abdominal pain: Secondary | ICD-10-CM | POA: Diagnosis not present

## 2018-05-22 DIAGNOSIS — C786 Secondary malignant neoplasm of retroperitoneum and peritoneum: Secondary | ICD-10-CM | POA: Diagnosis not present

## 2018-05-24 DIAGNOSIS — R630 Anorexia: Secondary | ICD-10-CM | POA: Diagnosis not present

## 2018-05-24 DIAGNOSIS — R1084 Generalized abdominal pain: Secondary | ICD-10-CM | POA: Diagnosis not present

## 2018-05-24 DIAGNOSIS — D500 Iron deficiency anemia secondary to blood loss (chronic): Secondary | ICD-10-CM | POA: Diagnosis not present

## 2018-05-24 DIAGNOSIS — C181 Malignant neoplasm of appendix: Secondary | ICD-10-CM | POA: Diagnosis not present

## 2018-05-24 DIAGNOSIS — N183 Chronic kidney disease, stage 3 (moderate): Secondary | ICD-10-CM | POA: Diagnosis not present

## 2018-05-24 DIAGNOSIS — C786 Secondary malignant neoplasm of retroperitoneum and peritoneum: Secondary | ICD-10-CM | POA: Diagnosis not present

## 2018-05-25 DIAGNOSIS — C786 Secondary malignant neoplasm of retroperitoneum and peritoneum: Secondary | ICD-10-CM | POA: Diagnosis not present

## 2018-05-25 DIAGNOSIS — R1084 Generalized abdominal pain: Secondary | ICD-10-CM | POA: Diagnosis not present

## 2018-05-25 DIAGNOSIS — C181 Malignant neoplasm of appendix: Secondary | ICD-10-CM | POA: Diagnosis not present

## 2018-05-25 DIAGNOSIS — N183 Chronic kidney disease, stage 3 (moderate): Secondary | ICD-10-CM | POA: Diagnosis not present

## 2018-05-25 DIAGNOSIS — D500 Iron deficiency anemia secondary to blood loss (chronic): Secondary | ICD-10-CM | POA: Diagnosis not present

## 2018-05-25 DIAGNOSIS — R630 Anorexia: Secondary | ICD-10-CM | POA: Diagnosis not present

## 2018-05-26 DIAGNOSIS — R1084 Generalized abdominal pain: Secondary | ICD-10-CM | POA: Diagnosis not present

## 2018-05-26 DIAGNOSIS — R630 Anorexia: Secondary | ICD-10-CM | POA: Diagnosis not present

## 2018-05-26 DIAGNOSIS — D500 Iron deficiency anemia secondary to blood loss (chronic): Secondary | ICD-10-CM | POA: Diagnosis not present

## 2018-05-26 DIAGNOSIS — C786 Secondary malignant neoplasm of retroperitoneum and peritoneum: Secondary | ICD-10-CM | POA: Diagnosis not present

## 2018-05-26 DIAGNOSIS — C181 Malignant neoplasm of appendix: Secondary | ICD-10-CM | POA: Diagnosis not present

## 2018-05-26 DIAGNOSIS — N183 Chronic kidney disease, stage 3 (moderate): Secondary | ICD-10-CM | POA: Diagnosis not present

## 2018-05-27 DIAGNOSIS — C786 Secondary malignant neoplasm of retroperitoneum and peritoneum: Secondary | ICD-10-CM | POA: Diagnosis not present

## 2018-05-27 DIAGNOSIS — R1084 Generalized abdominal pain: Secondary | ICD-10-CM | POA: Diagnosis not present

## 2018-05-27 DIAGNOSIS — N183 Chronic kidney disease, stage 3 (moderate): Secondary | ICD-10-CM | POA: Diagnosis not present

## 2018-05-27 DIAGNOSIS — R630 Anorexia: Secondary | ICD-10-CM | POA: Diagnosis not present

## 2018-05-27 DIAGNOSIS — C181 Malignant neoplasm of appendix: Secondary | ICD-10-CM | POA: Diagnosis not present

## 2018-05-27 DIAGNOSIS — D500 Iron deficiency anemia secondary to blood loss (chronic): Secondary | ICD-10-CM | POA: Diagnosis not present

## 2018-06-11 DEATH — deceased

## 2019-04-24 IMAGING — US US TRANSVAGINAL NON-OB
1 series · 13 of 25 positions shown · non-contrast
Comparison: CT of 1 day prior

CLINICAL DATA: Right-sided ovarian mass on CT.



[Series 1: us transvaginal non-ob · 0.24mm/px · 13 of 55 slices shown]
[im 1/55]
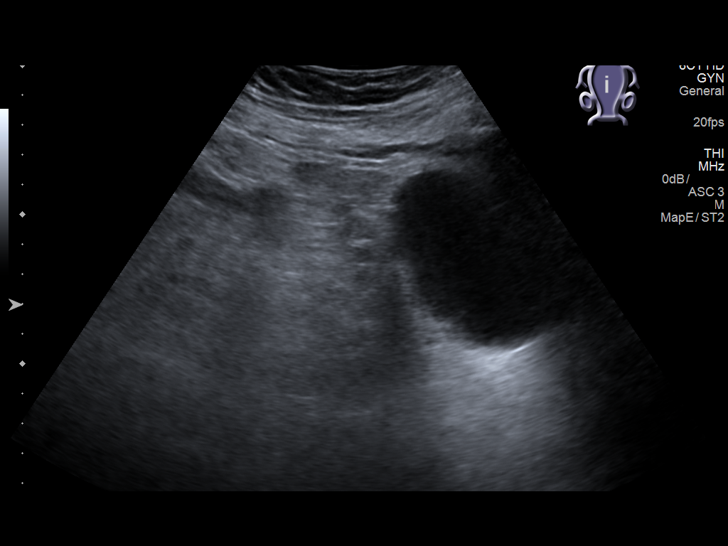
[im 5/55]
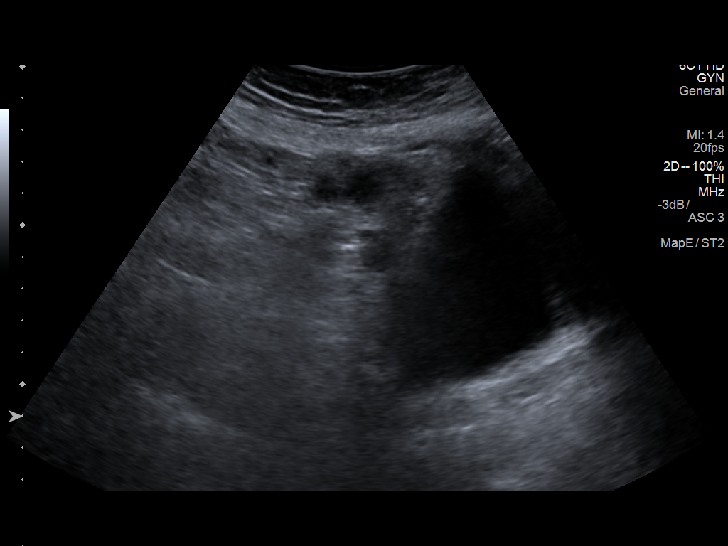
[im 10/55]
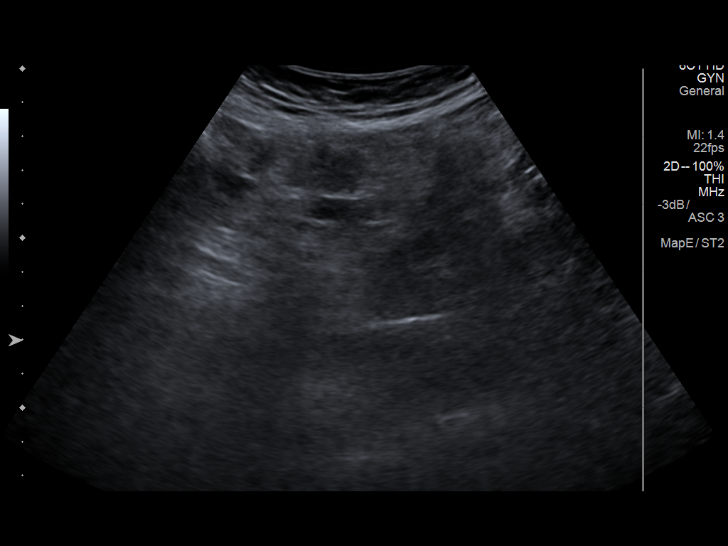
[im 14/55]
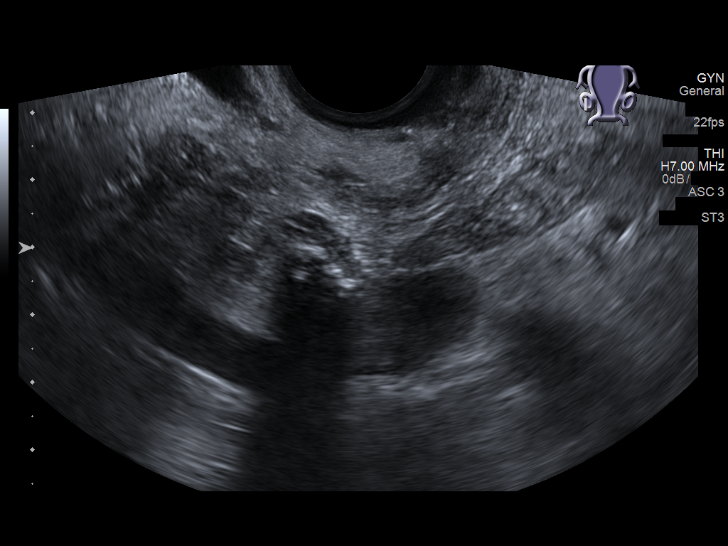
[im 19/55]
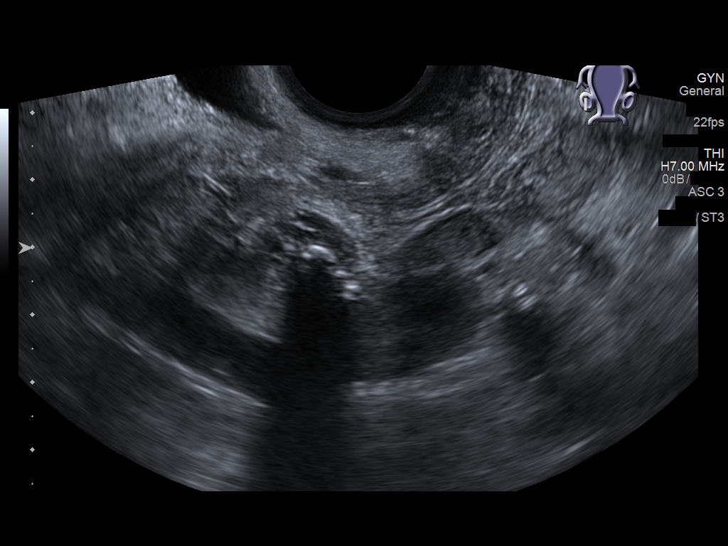
[im 23/55]
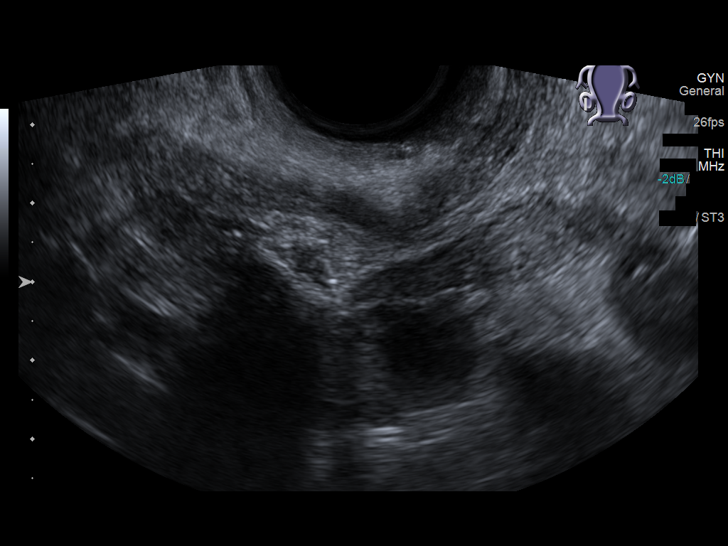
[im 28/55]
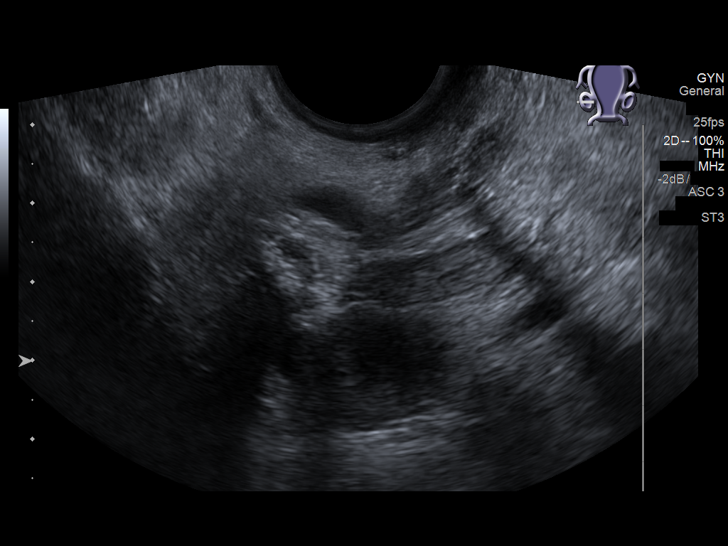
[im 32/55]
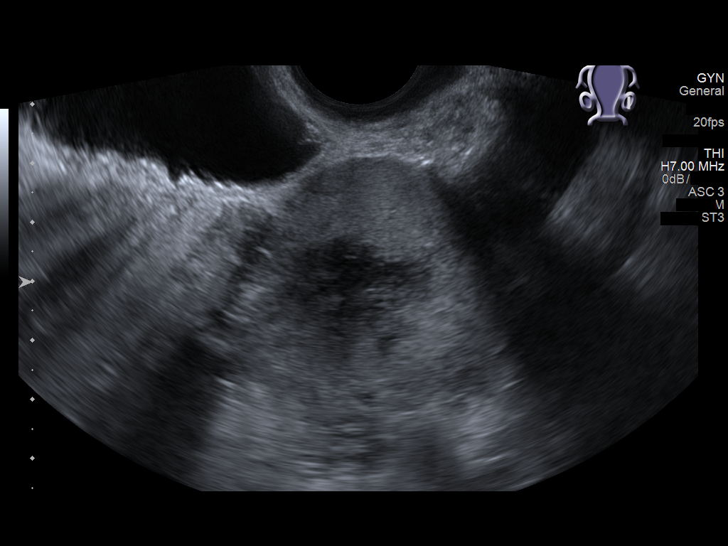
[im 37/55]
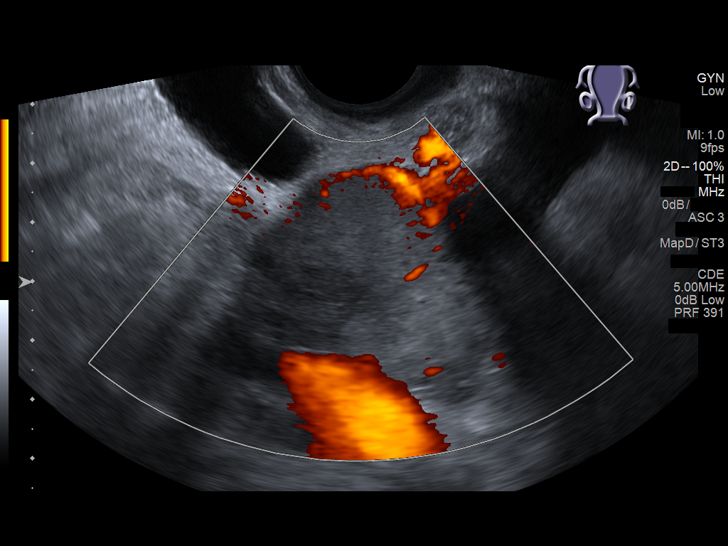
[im 41/55]
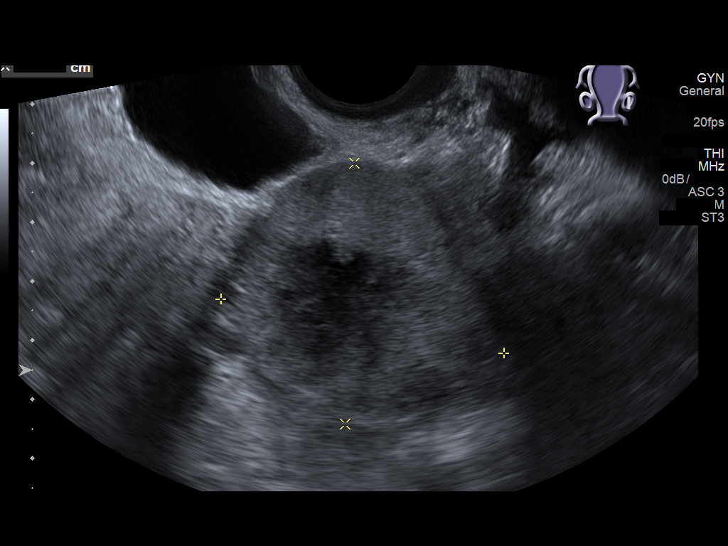
[im 46/55]
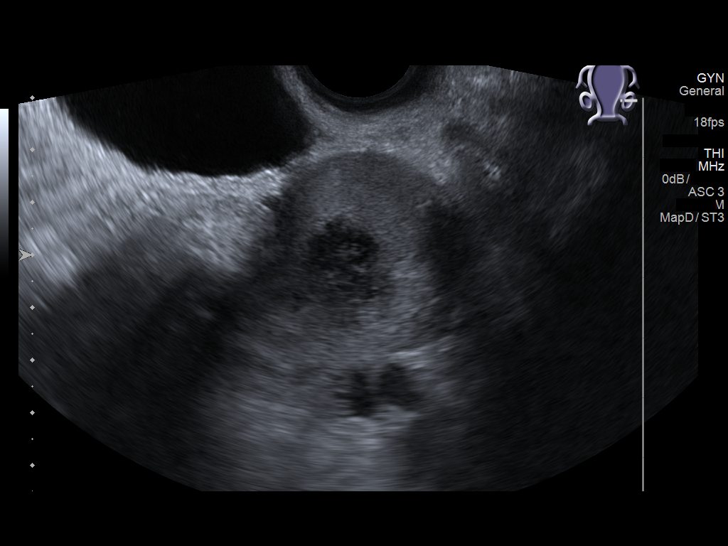
[im 50/55]
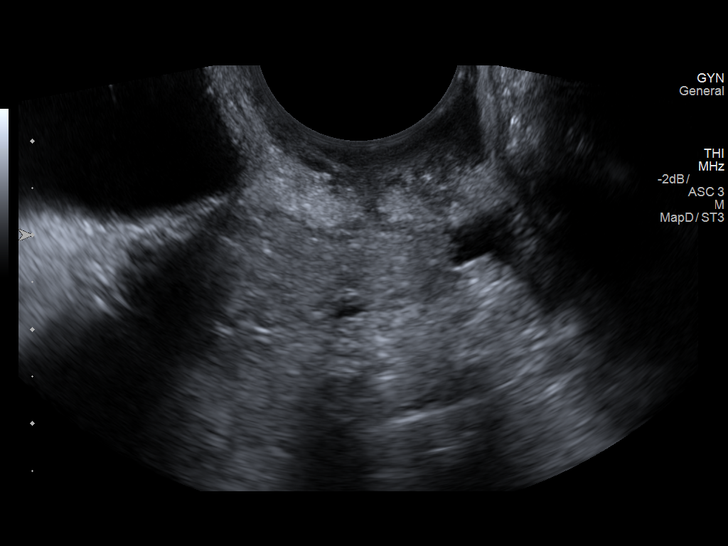
[im 55/55]
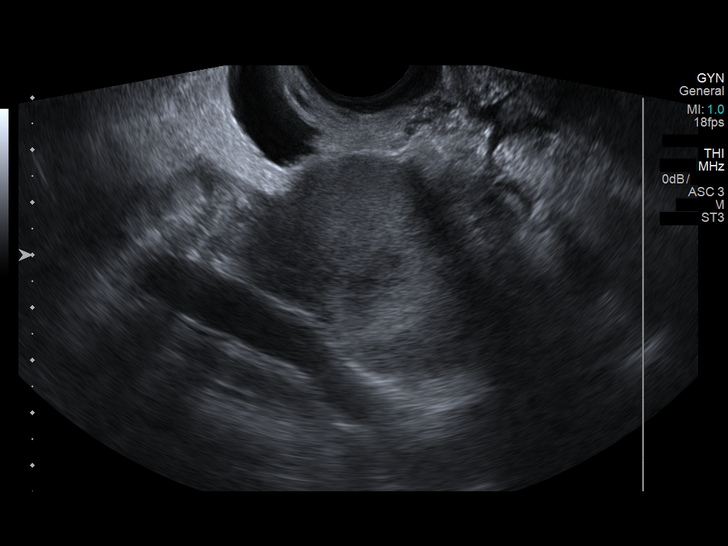

[13 of 25 positions shown; findings below may reference images not displayed]

FINDINGS: Uterus

Surgically absent

Right ovary

Measurements: 3.9 x 2.2 x 3.7 cm. Partially calcified right ovarian
lesion of 2.3 x 1.9 x 1.4 cm, including on image 37.

Just inferior to this, a partially cystic, partially solid
hypoechoic lesion measures 1.6 x 1.2 x 0.9 cm.

Left ovary

Measurements: 4.9 x 4.4 x 4.2 cm. Primarily solid with central
cystic component lesion measures 2.5 x 2.3 x 1.6 cm.

Other findings

Trace free pelvic fluid. There is also fluid adjacent the left
ovary.
IMPRESSION: 1. Bilateral ovarian lesions, including a calcified right-sided
lesion and a dominant solid left-sided lesion. Suspicious for benign
or malignant ovarian neoplasm(s). Consider gynecological
consultation. If the patient is a surgical candidate, pre and
postcontrast MRI may be informative.
2. Small volume cul-de-sac and left adnexal fluid.
